# Patient Record
Sex: Male | Born: 1960
Health system: Southern US, Community
[De-identification: ages and names within clinical notes are randomized; demographics above are authoritative.]

## PROBLEM LIST (undated history)

## (undated) DIAGNOSIS — M1990 Unspecified osteoarthritis, unspecified site: Secondary | ICD-10-CM

## (undated) DIAGNOSIS — I739 Peripheral vascular disease, unspecified: Secondary | ICD-10-CM

## (undated) DIAGNOSIS — M719 Bursopathy, unspecified: Secondary | ICD-10-CM

## (undated) DIAGNOSIS — I10 Essential (primary) hypertension: Secondary | ICD-10-CM

## (undated) HISTORY — DX: Essential (primary) hypertension: I10

## (undated) HISTORY — DX: Peripheral vascular disease, unspecified: I73.9

## (undated) HISTORY — PX: FRACTURE SURGERY: SHX138

## (undated) HISTORY — DX: Bursopathy, unspecified: M71.9

---

## 2009-11-09 ENCOUNTER — Emergency Department (HOSPITAL_COMMUNITY): Admission: EM | Admit: 2009-11-09 | Discharge: 2009-11-09 | Payer: Self-pay | Admitting: Emergency Medicine

## 2012-06-29 ENCOUNTER — Encounter (HOSPITAL_COMMUNITY): Payer: Self-pay

## 2012-06-29 ENCOUNTER — Emergency Department (HOSPITAL_COMMUNITY)
Admission: EM | Admit: 2012-06-29 | Discharge: 2012-06-29 | Disposition: A | Discharge status: Home / Self Care | Payer: Self-pay | Attending: Emergency Medicine | Admitting: Emergency Medicine

## 2012-06-29 DIAGNOSIS — I10 Essential (primary) hypertension: Secondary | ICD-10-CM | POA: Insufficient documentation

## 2012-06-29 DIAGNOSIS — M069 Rheumatoid arthritis, unspecified: Secondary | ICD-10-CM | POA: Insufficient documentation

## 2012-06-29 DIAGNOSIS — F17200 Nicotine dependence, unspecified, uncomplicated: Secondary | ICD-10-CM | POA: Insufficient documentation

## 2012-06-29 DIAGNOSIS — E119 Type 2 diabetes mellitus without complications: Secondary | ICD-10-CM | POA: Insufficient documentation

## 2012-06-29 DIAGNOSIS — IMO0001 Reserved for inherently not codable concepts without codable children: Secondary | ICD-10-CM | POA: Insufficient documentation

## 2012-06-29 DIAGNOSIS — M1990 Unspecified osteoarthritis, unspecified site: Secondary | ICD-10-CM

## 2012-06-29 DIAGNOSIS — R52 Pain, unspecified: Secondary | ICD-10-CM | POA: Insufficient documentation

## 2012-06-29 MED ORDER — TRAMADOL HCL 50 MG PO TABS: 50.0000 mg | ORAL_TABLET | Freq: Four times a day (QID) | ORAL | Status: AC | PRN

## 2012-06-29 NOTE — ED Notes (Signed)
Patient reports that he is having muscle aches/pain of the right hip, knee, shoulder, and elbow especially at night.

## 2012-06-29 NOTE — ED Provider Notes (Signed)
History     CSN: [id number]  Arrival date & time 06/29/12  1020   First MD Initiated Contact with Patient 06/29/12 1104      Chief Complaint  Patient presents with  . Muscle Pain    (Consider location/radiation/quality/duration/timing/severity/associated sxs/prior treatment) HPI  51 year old male presents complaining of muscle aches. Worse for the past 10 years he has had aches and pains to his shoulders, elbows, knees, hips, and hands, right side greater than left. Aches usually worsen at night time or when it rains.  For the past 10 days he has notices increasing aches and pain throughout his body.  Pain worsening at night time, sometimes keeping him from having a restful sleep.  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  Denies any recent trauma or accident.  Denies fever, headache, rash, numbness or weakness.  Denies cp, sob, or abd pain.  Pt sts he occasionally takes OTC ibuprofen and tylenol and it has helped.  Pt denies taking any other medication.  Denies hx of stroke.  Is a half/pack day smoker.  Pt was encouraged to come to ER for further evaluation by wife  since wife is a cancer patient and is here in the hospital today for her evaluation.    History reviewed. No pertinent past medical history.  History reviewed. No pertinent past surgical history.  Family History  Problem Relation Age of Onset  . Hypertension Father   . Rheum arthritis Sister   . Diabetes Brother     History  Substance Use Topics  . Smoking status: Current Everyday Smoker -- 0.5 packs/day  . Smokeless tobacco: Never Used  . Alcohol Use: 0.6 oz/week    1 Cans of beer per week     daily      Review of Systems  Constitutional: Negative for fever, activity change, fatigue and unexpected weight change.  Gastrointestinal: Negative for abdominal pain.  Musculoskeletal: Negative for back pain.  Skin: Negative for rash.  Neurological: Negative for tremors and numbness.  All other systems reviewed  and are negative.    Allergies  Review of patient's allergies indicates no known allergies.  Home Medications   Current Outpatient Rx  Name Route Sig Dispense Refill  . ASPIRIN 81 MG PO CHEW Oral Chew 81 mg by mouth daily.      BP 176/79  Pulse 62  Temp 97.9 F (36.6 C) (Oral)  Resp 18  SpO2 99%  Physical Exam  Nursing note and vitals reviewed. Constitutional: He appears well-developed and well-nourished. No distress.       Awake, alert, nontoxic appearance  HENT:  Head: Atraumatic.  Eyes: Conjunctivae are normal. Right eye exhibits no discharge. Left eye exhibits no discharge.  Neck: Normal range of motion. Neck supple.  Cardiovascular: Normal rate and regular rhythm.   Pulmonary/Chest: Effort normal. No respiratory distress. He exhibits no tenderness.  Abdominal: Soft. There is no tenderness. There is no rebound.  Musculoskeletal: Normal range of motion. He exhibits no edema and no tenderness.       Right shoulder: Normal.       Left shoulder: Normal.       Right elbow: Normal.      Left elbow: Normal.       Right wrist: Normal.       Left wrist: Normal.       Right hip: Normal.       Left hip: Normal.       Right knee: Normal.  Left knee: Normal.       ROM appears intact, no obvious focal weakness  Neurological: He is alert.  Skin: Skin is warm and dry. No rash noted.  Psychiatric: He has a normal mood and affect.    ED Course  Procedures (including critical care time)  Labs Reviewed - No data to display No results found.   No diagnosis found.  1. Body aches/arthritis  MDM  Pt with generalized aches and pain suggestive of arthritis.  Doubt [facility] as sxs has been ongoing for years.  Examination unremarkable, VSS.  reasurrance given.  Referral given, will give short course of Tramadol to use as needed.  Pt voice understanding and agrees with plan.  Doubt rhabdomyolisis        Fayrene Helper, PA-C 06/29/12 1122

## 2012-06-29 NOTE — ED Provider Notes (Signed)
Medical screening examination/treatment/procedure(s) were performed by non-physician practitioner and as supervising physician I was immediately available for consultation/collaboration.  Donnetta Hutching, MD 06/29/12 1340

## 2013-03-20 ENCOUNTER — Emergency Department (HOSPITAL_COMMUNITY)
Admission: EM | Admit: 2013-03-20 | Discharge: 2013-03-20 | Disposition: A | Discharge status: Home / Self Care | Payer: PRIVATE HEALTH INSURANCE | Attending: Emergency Medicine | Admitting: Emergency Medicine

## 2013-03-20 ENCOUNTER — Encounter (HOSPITAL_COMMUNITY): Payer: Self-pay | Admitting: Family Medicine

## 2013-03-20 DIAGNOSIS — S0501XA Injury of conjunctiva and corneal abrasion without foreign body, right eye, initial encounter: Secondary | ICD-10-CM

## 2013-03-20 DIAGNOSIS — S058X9A Other injuries of unspecified eye and orbit, initial encounter: Secondary | ICD-10-CM | POA: Insufficient documentation

## 2013-03-20 DIAGNOSIS — F17200 Nicotine dependence, unspecified, uncomplicated: Secondary | ICD-10-CM | POA: Insufficient documentation

## 2013-03-20 DIAGNOSIS — IMO0002 Reserved for concepts with insufficient information to code with codable children: Secondary | ICD-10-CM | POA: Insufficient documentation

## 2013-03-20 DIAGNOSIS — Y929 Unspecified place or not applicable: Secondary | ICD-10-CM | POA: Insufficient documentation

## 2013-03-20 DIAGNOSIS — Y939 Activity, unspecified: Secondary | ICD-10-CM | POA: Insufficient documentation

## 2013-03-20 DIAGNOSIS — H538 Other visual disturbances: Secondary | ICD-10-CM | POA: Insufficient documentation

## 2013-03-20 MED ORDER — FLUORESCEIN SODIUM 1 MG OP STRP
ORAL_STRIP | OPHTHALMIC | Status: AC
  Filled 2013-03-20: qty 2

## 2013-03-20 MED ORDER — ERYTHROMYCIN 5 MG/GM OP OINT: TOPICAL_OINTMENT | OPHTHALMIC | Status: DC

## 2013-03-20 MED ORDER — OXYCODONE-ACETAMINOPHEN 5-325 MG PO TABS: 1.0000 | ORAL_TABLET | Freq: Four times a day (QID) | ORAL | Status: DC | PRN

## 2013-03-20 MED ORDER — TETRACAINE HCL 0.5 % OP SOLN
1.0000 [drp] | Freq: Once | OPHTHALMIC | Status: AC
  Administered 2013-03-20: 1 [drp] via OPHTHALMIC
  Filled 2013-03-20: qty 2

## 2013-03-20 MED ORDER — FLUORESCEIN SODIUM 1 MG OP STRP
2.0000 | ORAL_STRIP | Freq: Once | OPHTHALMIC | Status: AC
  Administered 2013-03-20: 19:00:00 via OPHTHALMIC
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
## 2013-03-22 NOTE — ED Provider Notes (Signed)
Medical screening examination/treatment/procedure(s) were performed by non-physician practitioner and as supervising physician I was immediately available for consultation/collaboration.   Beuford Garcilazo W. Yaden Seith, MD 03/22/13 1313 

## 2013-03-24 ENCOUNTER — Emergency Department (INDEPENDENT_AMBULATORY_CARE_PROVIDER_SITE_OTHER)
Admission: EM | Admit: 2013-03-24 | Discharge: 2013-03-24 | Disposition: A | Discharge status: Home / Self Care | Payer: PRIVATE HEALTH INSURANCE | Source: Home / Self Care | Attending: Family Medicine | Admitting: Family Medicine

## 2013-03-24 ENCOUNTER — Encounter (HOSPITAL_COMMUNITY): Payer: Self-pay | Admitting: Emergency Medicine

## 2013-03-24 DIAGNOSIS — Z5189 Encounter for other specified aftercare: Secondary | ICD-10-CM

## 2013-03-24 DIAGNOSIS — S0501XD Injury of conjunctiva and corneal abrasion without foreign body, right eye, subsequent encounter: Secondary | ICD-10-CM

## 2013-03-24 HISTORY — DX: Unspecified osteoarthritis, unspecified site: M19.90

## 2013-03-24 MED ORDER — TOBRAMYCIN 0.3 % OP SOLN
OPHTHALMIC | Status: AC
  Filled 2013-03-24: qty 5

## 2013-03-24 MED ORDER — TETRACAINE HCL 0.5 % OP SOLN
OPHTHALMIC | Status: AC
  Filled 2013-03-24: qty 2
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
## 2013-03-31 NOTE — ED Provider Notes (Signed)
Medical screening examination/treatment/procedure(s) were performed by non-physician practitioner and as supervising physician I was immediately available for consultation/collaboration.   MORENO-COLL,Tenzin Edelman; MD  Getsemani Lindon Moreno-Coll, MD 03/31/13 0922 

## 2016-10-29 ENCOUNTER — Emergency Department (HOSPITAL_COMMUNITY)
Admission: EM | Admit: 2016-10-29 | Discharge: 2016-10-29 | Disposition: A | Discharge status: Home / Self Care | Payer: Self-pay | Attending: Physician Assistant | Admitting: Physician Assistant

## 2016-10-29 ENCOUNTER — Telehealth: Payer: Self-pay | Admitting: *Deleted

## 2016-10-29 ENCOUNTER — Encounter (HOSPITAL_COMMUNITY): Payer: Self-pay | Admitting: *Deleted

## 2016-10-29 DIAGNOSIS — G8929 Other chronic pain: Secondary | ICD-10-CM | POA: Insufficient documentation

## 2016-10-29 DIAGNOSIS — F17200 Nicotine dependence, unspecified, uncomplicated: Secondary | ICD-10-CM | POA: Insufficient documentation

## 2016-10-29 NOTE — Discharge Instructions (Signed)
Please read attached information. If you experience any new or worsening signs or symptoms please return to the emergency room for evaluation. Please follow-up with your primary care provider or specialist as discussed.  °

## 2016-10-29 NOTE — ED Triage Notes (Signed)
Pt reports aches and pains from shoulder down to his knees for entire life but more severe recently. Reports abd being tender on palpation. No acute distress noted at triage.

## 2016-10-29 NOTE — Telephone Encounter (Signed)
Entered in error

## 2016-10-29 NOTE — ED Provider Notes (Signed)
[facility]-EMERGENCY DEPT Provider Note   CSN: [id number] Arrival date & time: 10/29/16  [id number]     [id number]   Chief Complaint Chief Complaint  Patient presents with  . Pain    HPI Ruben Fuentes is a 55 y.o. male.  HPI   55 year old male presents today with complaints of chronic pain. Patient reports since he was a child he had diffuse body aches, pain in his joints. He reports symptoms are worse after prolonged periods of sitting or ambulating. He reports cold weather makes symptoms worse, denies any significant swelling or edema. Denies any other systemic complaints including fever, weight loss, weight gain, heat or cold intolerance. Patient has not been formally evaluated for this in the past, reports his Orange cart recently expired. Patient denies any other acute complaints and is accompanied by his significant other today.  Past Medical History:  Diagnosis Date  . Arthritis     There are no active problems to display for this patient.   History reviewed. No pertinent surgical history.     Home Medications    Prior to Admission medications   Medication Sig Start Date End Date Taking? Authorizing Provider  ibuprofen (ADVIL,MOTRIN) 200 MG tablet Take 600 mg by mouth every 6 (six) hours as needed for mild pain.   Yes Historical Provider, MD  naproxen sodium (ALEVE) 220 MG tablet Take 660 mg by mouth daily as needed (pain).   Yes Historical Provider, MD  erythromycin ophthalmic ointment Place a 1/2 inch ribbon of ointment into the lower eyelid. Patient not taking: Reported on 10/29/2016 03/20/13   Roxy Horsemanobert Browning, PA-C  oxyCODONE-acetaminophen (PERCOCET/ROXICET) 5-325 MG per tablet Take 1 tablet by mouth every 6 (six) hours as needed for pain. Patient not taking: Reported on 10/29/2016 03/20/13   Roxy Horsemanobert Browning, PA-C    Family History Family History  Problem Relation Age of Onset  . Hypertension Father   . Rheum arthritis Sister   . Diabetes Brother     Social  History Social History  Substance Use Topics  . Smoking status: Current Every Day Smoker    Packs/day: 0.50  . Smokeless tobacco: Never Used  . Alcohol use 0.6 oz/week    1 Cans of beer per week     Comment: daily     Allergies   Patient has no known allergies.   Review of Systems Review of Systems  All other systems reviewed and are negative.    Physical Exam Updated Vital Signs BP 179/89 (BP Location: Left Arm)   Pulse 88   Temp 97.6 F (36.4 C) (Oral)   Resp 22   Ht 5\' 7"  (1.702 m)   Wt 60 kg   SpO2 99%   BMI 20.71 kg/m   Physical Exam  Constitutional: He is oriented to person, place, and time. He appears well-developed and well-nourished.  HENT:  Head: Normocephalic and atraumatic.  Eyes: Conjunctivae are normal. Pupils are equal, round, and reactive to light. Right eye exhibits no discharge. Left eye exhibits no discharge. No scleral icterus.  Neck: Normal range of motion. No JVD present. No tracheal deviation present.  Pulmonary/Chest: Effort normal. No stridor.  Musculoskeletal:  Very minor tenderness to palpation of all major muscle groups, joints are supple with full active range of motion, no rashes or skin changes  Neurological: He is alert and oriented to person, place, and time. Coordination normal.  Psychiatric: He has a normal mood and affect. His behavior is normal. Judgment and thought content normal.  Nursing  note and vitals reviewed.    ED Treatments / Results  Labs (all labs ordered are listed, but only abnormal results are displayed) Labs Reviewed - No data to display  EKG  EKG Interpretation None       Radiology No results found.  Procedures Procedures (including critical care time)  Medications Ordered in ED Medications - No data to display   Initial Impression / Assessment and Plan / ED Course  I have reviewed the triage vital signs and the nursing notes.  Pertinent labs & imaging results that were available during my  care of the patient were reviewed by me and considered in my medical decision making (see chart for details).  Clinical Course     Labs:  Imaging:  Consults:  Therapeutics:  Discharge Meds:   Assessment/Plan:  Patient reports complaints of pain diffusely throughout his body since he was a child. No acute change. Patient's complaints. Arthritic in nature. He will be referred to [facility] for reevaluation and ongoing management of his chronic pain.       Final Clinical Impressions(s) / ED Diagnoses   Final diagnoses:  Other chronic pain    New Prescriptions New Prescriptions   No medications on file     Eyvonne MechanicJeffrey Asuncion Tapscott, PA-C 10/29/16 1016    Courteney Lyn Mackuen, MD 10/29/16 1536

## 2016-11-04 ENCOUNTER — Ambulatory Visit: Payer: Self-pay | Attending: Internal Medicine | Admitting: Physician Assistant

## 2016-11-04 ENCOUNTER — Encounter: Payer: Self-pay | Admitting: Physician Assistant

## 2016-11-04 VITALS — BP 189/98 | HR 80 | Temp 97.8°F | Wt 132.8 lb

## 2016-11-04 DIAGNOSIS — Z5189 Encounter for other specified aftercare: Secondary | ICD-10-CM | POA: Insufficient documentation

## 2016-11-04 DIAGNOSIS — M1990 Unspecified osteoarthritis, unspecified site: Secondary | ICD-10-CM | POA: Insufficient documentation

## 2016-11-04 DIAGNOSIS — I10 Essential (primary) hypertension: Secondary | ICD-10-CM | POA: Insufficient documentation

## 2016-11-04 DIAGNOSIS — M2550 Pain in unspecified joint: Secondary | ICD-10-CM | POA: Insufficient documentation

## 2016-11-04 DIAGNOSIS — Z23 Encounter for immunization: Secondary | ICD-10-CM

## 2016-11-04 LAB — CBC WITH DIFFERENTIAL/PLATELET
BASOS PCT: 0 %
Basophils Absolute: 0 cells/uL (ref 0–200)
EOS PCT: 0 %
Eosinophils Absolute: 0 cells/uL — ABNORMAL LOW (ref 15–500)
HCT: 48.7 % (ref 38.5–50.0)
Hemoglobin: 16.2 g/dL (ref 13.2–17.1)
LYMPHS PCT: 41 %
Lymphs Abs: 2788 cells/uL (ref 850–3900)
MCH: 29.8 pg (ref 27.0–33.0)
MCHC: 33.3 g/dL (ref 32.0–36.0)
MCV: 89.5 fL (ref 80.0–100.0)
MONO ABS: 884 {cells}/uL (ref 200–950)
MONOS PCT: 13 %
MPV: 9.5 fL (ref 7.5–12.5)
NEUTROS PCT: 46 %
Neutro Abs: 3128 cells/uL (ref 1500–7800)
PLATELETS: 287 10*3/uL (ref 140–400)
RBC: 5.44 MIL/uL (ref 4.20–5.80)
RDW: 13.6 % (ref 11.0–15.0)
WBC: 6.8 10*3/uL (ref 3.8–10.8)

## 2016-11-04 LAB — HEPATIC FUNCTION PANEL
ALBUMIN: 4.7 g/dL (ref 3.6–5.1)
ALK PHOS: 91 U/L (ref 40–115)
ALT: 17 U/L (ref 9–46)
AST: 24 U/L (ref 10–35)
BILIRUBIN DIRECT: 0.1 mg/dL (ref <=0.2)
BILIRUBIN TOTAL: 0.4 mg/dL (ref 0.2–1.2)
Indirect Bilirubin: 0.3 mg/dL (ref 0.2–1.2)
Total Protein: 8.1 g/dL (ref 6.1–8.1)

## 2016-11-04 LAB — BASIC METABOLIC PANEL
BUN: 7 mg/dL (ref 7–25)
CHLORIDE: 98 mmol/L (ref 98–110)
CO2: 29 mmol/L (ref 20–31)
Calcium: 9.6 mg/dL (ref 8.6–10.3)
Creat: 1.1 mg/dL (ref 0.70–1.33)
Glucose, Bld: 77 mg/dL (ref 65–99)
POTASSIUM: 3.9 mmol/L (ref 3.5–5.3)
SODIUM: 138 mmol/L (ref 135–146)

## 2016-11-04 LAB — TSH: TSH: 0.62 m[IU]/L (ref 0.40–4.50)

## 2016-11-04 MED ORDER — TRAMADOL HCL 50 MG PO TABS: 50.0000 mg | ORAL_TABLET | Freq: Three times a day (TID) | ORAL | 0 refills | Status: DC | PRN

## 2016-11-04 MED ORDER — LISINOPRIL 10 MG PO TABS: 10.0000 mg | ORAL_TABLET | Freq: Every day | ORAL | 1 refills | Status: DC

## 2016-11-04 MED FILL — traMADol HCL 50 MG TABS: 50 | 10 days supply | Qty: 30 | Fill #0

## 2016-11-04 MED FILL — ?LISINOPRIL 10 MG TABLET: 10 | 30 days supply | Qty: 30 | Fill #0

## 2016-11-04 NOTE — Progress Notes (Signed)
Ruben Fuentes [place]  ZOX:[id number]  [id number]  DOB - 02/21/1961  Chief Complaint  Patient presents with  . Hospitalization Follow-up       Subjective:   Ruben Fuentes [place] is a 55 y.o. male here today for re establish care. He was in the ED on 10/29/16 with acute on chronic joint aches. Stated 2 weeks of joint pain. Diffuse. Worse with sitting long periods and at night. OTC meds with temp relief. No labs or imaging in ED. No meds. Told to come here.   BP in ED noted 179/89. Fam hx HTN and has been told himself that his BP was high in past.   ROS: GEN: denies fever or chills, denies change in weight Skin: denies lesions or rashes HEENT: denies headache, earache, epistaxis, sore throat, or neck pain LUNGS: denies SHOB, dyspnea, PND, orthopnea CV: denies CP or palpitations ABD: denies abd pain, N or V EXT: denies muscle spasms or swelling; no pain in lower ext, no weakness NEURO: denies numbness or tingling, denies sz, stroke or TIA  ALLERGIES: No Known Allergies  PAST MEDICAL HISTORY: Past Medical History:  Diagnosis Date  . Arthritis     PAST SURGICAL HISTORY: No past surgical history on file.  MEDICATIONS AT HOME: Prior to Admission medications   Medication Sig Start Date End Date Taking? Authorizing Provider  ibuprofen (ADVIL,MOTRIN) 200 MG tablet Take 600 mg by mouth every 6 (six) hours as needed for mild pain.   Yes Historical Provider, MD  naproxen sodium (ALEVE) 220 MG tablet Take 660 mg by mouth daily as needed (pain).   Yes Historical Provider, MD  erythromycin ophthalmic ointment Place a 1/2 inch ribbon of ointment into the lower eyelid. Patient not taking: Reported on 11/04/2016 03/20/13   Roxy Horsemanobert Browning, PA-C  lisinopril (PRINIVIL,ZESTRIL) 10 MG tablet Take 1 tablet (10 mg total) by mouth daily. 11/04/16   Vivianne Masteriffany S Noel, PA-C  oxyCODONE-acetaminophen (PERCOCET/ROXICET) 5-325 MG per tablet Take 1 tablet by mouth every 6 (six) hours as needed for pain. Patient  not taking: Reported on 11/04/2016 03/20/13   Roxy Horsemanobert Browning, PA-C     Objective:   Vitals:   11/04/16 1211  BP: (!) 189/98  Pulse: 80  Temp: 97.8 F (36.6 C)  TempSrc: Oral  SpO2: 97%  Weight: 132 lb 12.8 oz (60.2 kg)    Exam General appearance : Awake, alert, not in any distress. Speech Clear. Not toxic looking HEENT: Atraumatic and Normocephalic, pupils equally reactive to light and accomodation Neck: supple, no JVD. No cervical lymphadenopathy.  Chest:Good air entry bilaterally, no added sounds  CVS: S1 S2 regular, no murmurs.  Abdomen: Bowel sounds present, Non tender and not distended with no guarding, rigidity or rebound. Extremities: B/L Lower Ext shows no edema, both legs are warm to touch Neurology: Awake alert, and oriented X 3, CN II-XII intact, Non focal   Assessment & Plan  1. Arthralgias   -check routine labs r/o systemic cause  -Tramadol prn  -considered steroids 2. Smoker  -cessation discussed  3. HTN  -start ACE  -nurse check in 1 week  -DASH diet/increase exercise  Appt with financial counselor Return in about 1 year (around 11/04/2017).  The patient was given clear instructions to go to ER or return to medical center if symptoms don't improve, worsen or new problems develop. The patient verbalized understanding. The patient was told to call to get lab results if they haven't heard anything in the next week.   This note has been created  with Education officer, environmentalDragon speech recognition software and smart phrase technology. Any transcriptional errors are unintentional.    Scot Juniffany Noel, PA-C [facility] [place], [place] [phone]   11/04/2016, 12:30 PM

## 2016-11-04 NOTE — Patient Instructions (Signed)
Low salt diet Increase exercise Stop smoking

## 2016-11-05 LAB — ANA, IFA COMPREHENSIVE PANEL
ANA: POSITIVE — AB
ENA SM Ab Ser-aCnc: <1
SM/RNP: NEGATIVE
SSA (RO) (ENA) ANTIBODY, IGG: NEGATIVE
SSB (LA) (ENA) ANTIBODY, IGG: NEGATIVE
Scleroderma (Scl-70) (ENA) Antibody, IgG: <1
ds DNA Ab: <1 IU/mL

## 2016-11-05 LAB — ANTI-NUCLEAR AB-TITER (ANA TITER)

## 2016-11-05 LAB — C-REACTIVE PROTEIN: CRP: 4.9 mg/L (ref <8.0)

## 2016-11-05 LAB — HIV ANTIBODY (ROUTINE TESTING W REFLEX): HIV 1&2 Ab, 4th Generation: NONREACTIVE

## 2016-11-05 LAB — SEDIMENTATION RATE: Sed Rate: 1 mm/hr (ref 0–20)

## 2016-11-06 LAB — ANTIPHOSPHOLIPID SYNDROME DIAGNOSTIC PANEL
Anticardiolipin IgA: <11 [APL'U]
Anticardiolipin IgG: <14 [GPL'U]
Anticardiolipin IgM: <12 [MPL'U]
BETA 2 GLYCO I IGG: 9 SGU (ref <=20)
Beta-2-Glycoprotein I IgA: <9 SAU (ref <=20)
Beta-2-Glycoprotein I IgM: <9 SMU (ref <=20)

## 2016-11-06 LAB — RFX PTT-LA W/RFX TO HEX PHASE CONF: PTT-LA SCREEN: 40 s (ref <=40)

## 2016-11-06 LAB — RFX DRVVT SCR W/RFLX CONF 1:1 MIX: DRVVT SCREEN: 34 s (ref <=45)

## 2016-11-07 ENCOUNTER — Telehealth: Payer: Self-pay

## 2016-11-07 NOTE — Telephone Encounter (Signed)
Pt was called and no VM set up to leave message. If pt call back please inform him that his results are normal and to keep his follow up appointment.

## 2016-11-13 ENCOUNTER — Ambulatory Visit: Payer: Self-pay | Attending: Family Medicine | Admitting: Family Medicine

## 2016-11-13 ENCOUNTER — Encounter: Payer: Self-pay | Admitting: Family Medicine

## 2016-11-13 VITALS — BP 190/80 | HR 76 | Temp 98.0°F | Resp 16 | Ht 66.0 in | Wt 131.0 lb

## 2016-11-13 DIAGNOSIS — I10 Essential (primary) hypertension: Secondary | ICD-10-CM | POA: Insufficient documentation

## 2016-11-13 DIAGNOSIS — M25461 Effusion, right knee: Secondary | ICD-10-CM | POA: Insufficient documentation

## 2016-11-13 DIAGNOSIS — M25462 Effusion, left knee: Secondary | ICD-10-CM | POA: Insufficient documentation

## 2016-11-13 DIAGNOSIS — M2550 Pain in unspecified joint: Secondary | ICD-10-CM | POA: Insufficient documentation

## 2016-11-13 DIAGNOSIS — M1990 Unspecified osteoarthritis, unspecified site: Secondary | ICD-10-CM | POA: Insufficient documentation

## 2016-11-13 DIAGNOSIS — Z0000 Encounter for general adult medical examination without abnormal findings: Secondary | ICD-10-CM | POA: Insufficient documentation

## 2016-11-13 DIAGNOSIS — M25561 Pain in right knee: Secondary | ICD-10-CM | POA: Insufficient documentation

## 2016-11-13 DIAGNOSIS — M25562 Pain in left knee: Secondary | ICD-10-CM | POA: Insufficient documentation

## 2016-11-13 MED ORDER — IBUPROFEN 800 MG PO TABS: 800.0000 mg | ORAL_TABLET | Freq: Three times a day (TID) | ORAL | 0 refills | Status: DC | PRN

## 2016-11-13 MED ORDER — HYDRALAZINE HCL 25 MG PO TABS: 25.0000 mg | ORAL_TABLET | Freq: Four times a day (QID) | ORAL | 2 refills | Status: DC

## 2016-11-13 MED FILL — hydrALAZINE HCL 25 MG TABS: 25 | 8 days supply | Qty: 30 | Fill #0

## 2016-11-13 MED FILL — IBUPROFEN 800 MG TABLET: 800 | 30 days supply | Qty: 90 | Fill #0

## 2016-11-13 NOTE — Progress Notes (Signed)
Subjective:  Patient ID: Ruben Fuentes, male    DOB: 01/30/1961  Age: 55 y.o. MRN: [id number]  CC: Arthritis   HPI Ruben Fuentes comes in to establish care after recent hospitalization for arthralgia on 10/29/16. He was also recently diagnosed with hypertension. He reports joint pain with occasional swelling to the joints especially the bilateral knees. He reports pain 10/10 at its worst. He reports working in [employer] for many years. He denies any CP or SOB. He denies any dizziness or headaches.  Outpatient Medications Prior to Visit  Medication Sig Dispense Refill  . erythromycin ophthalmic ointment Place a 1/2 inch ribbon of ointment into the lower eyelid. 3.5 g 0  . traMADol (ULTRAM) 50 MG tablet Take 1 tablet (50 mg total) by mouth every 8 (eight) hours as needed. 30 tablet 0  . ibuprofen (ADVIL,MOTRIN) 200 MG tablet Take 600 mg by mouth every 6 (six) hours as needed for mild pain.    Marland Kitchen. lisinopril (PRINIVIL,ZESTRIL) 10 MG tablet Take 1 tablet (10 mg total) by mouth daily. 30 tablet 1  . naproxen sodium (ALEVE) 220 MG tablet Take 660 mg by mouth daily as needed (pain).    Marland Kitchen. oxyCODONE-acetaminophen (PERCOCET/ROXICET) 5-325 MG per tablet Take 1 tablet by mouth every 6 (six) hours as needed for pain. (Patient not taking: Reported on 11/13/2016) 13 tablet 0   No facility-administered medications prior to visit.     ROS Review of Systems  Constitutional: Negative.   Respiratory: Negative.   Cardiovascular: Negative.   Gastrointestinal: Negative.   Musculoskeletal: Positive for arthralgias and joint swelling.    Objective:  BP (!) 190/80 Comment: manually  Pulse 76   Temp 98 F (36.7 C) (Oral)   Resp 16   Ht 5\' 6"  (1.676 m)   Wt 131 lb (59.4 kg)   SpO2 98%   BMI 21.14 kg/m   BP/Weight 11/13/2016 11/04/2016 10/29/2016  Systolic BP 190 189 179  Diastolic BP 80 98 89  Wt. (Lbs) 131 132.8 132.25  BMI 21.14 20.8 20.71      Physical Exam  Constitutional: He is  oriented to person, place, and time. He appears well-developed and well-nourished.  Neck: Normal range of motion. No JVD present.  Cardiovascular: Normal rate, regular rhythm, normal heart sounds and intact distal pulses.   Pulmonary/Chest: Effort normal and breath sounds normal.  Abdominal: Soft. Bowel sounds are normal. He exhibits no mass. There is no tenderness.  Musculoskeletal: Normal range of motion. He exhibits tenderness (Bilateral lateral knee. ). He exhibits no edema or deformity.  Neurological: He is alert and oriented to person, place, and time.  Skin: Skin is warm and dry. No erythema.     Assessment & Plan:   1. Arthralgia, unspecified joint - ibuprofen (ADVIL,MOTRIN) 800 MG tablet; Take 1 tablet (800 mg total) by mouth every 8 (eight) hours as needed for mild pain or moderate pain (Take with food.).  Dispense: 90 tablet; Refill: 0 - Rheumatoid factor  2. Essential hypertension - hydrALAZINE (APRESOLINE) 25 MG tablet; Take 1 tablet (25 mg total) by mouth 4 (four) times daily.  Dispense: 30 tablet; Refill: 2 -Return to the office in 1 week for BP check.  3. Healthcare maintenance - Hepatitis C Antibody - Ambulatory referral to Gastroenterology for colonoscopy screening.   Meds ordered this encounter  Medications  . ibuprofen (ADVIL,MOTRIN) 800 MG tablet    Sig: Take 1 tablet (800 mg total) by mouth every 8 (eight) hours as needed for mild  pain or moderate pain (Take with food.).    Dispense:  90 tablet    Refill:  0    Order Specific Question:   Supervising Provider    Answer:   Quentin AngstJEGEDE, OLUGBEMIGA E [id number]]  . hydrALAZINE (APRESOLINE) 25 MG tablet    Sig: Take 1 tablet (25 mg total) by mouth 4 (four) times daily.    Dispense:  30 tablet    Refill:  2    Order Specific Question:   Supervising Provider    Answer:   Quentin AngstJEGEDE, OLUGBEMIGA E [id number]]    Follow-up: Return in about 3 months (around 02/11/2017) for Follow up in 3 months for hypertension. Come back in 1  week for BP check. Lizbeth Bark.   Ruben Lovering R Aeron Lheureux FNP

## 2016-11-13 NOTE — Progress Notes (Signed)
Recent hospital visit for arthralgia.  Refill eye ointment?

## 2016-11-13 NOTE — Patient Instructions (Signed)
Arthritis Introduction Arthritis means joint pain. It can also mean joint disease. A joint is a place where bones come together. People who have arthritis may have:  Red joints.  Swollen joints.  Stiff joints.  Warm joints.  A fever.  A feeling of being sick. Follow these instructions at home: Pay attention to any changes in your symptoms. Take these actions to help with your pain and swelling. Medicines  Take over-the-counter and prescription medicines only as told by your doctor.  Do not take aspirin for pain if your doctor says that you may have gout. Activity  Rest your joint if your doctor tells you to.  Avoid activities that make the pain worse.  Exercise your joint regularly as told by your doctor. Try doing exercises like:  Swimming.  Water aerobics.  Biking.  Walking. Joint Care   If your joint is swollen, keep it raised (elevated) if told by your doctor.  If your joint feels stiff in the morning, try taking a warm shower.  If you have diabetes, do not apply heat without asking your doctor.  If told, apply heat to the joint:  Put a towel between the joint and the hot pack or heating pad.  Leave the heat on the area for 20-30 minutes.  If told, apply ice to the joint:  Put ice in a plastic bag.  Place a towel between your skin and the bag.  Leave the ice on for 20 minutes, 2-3 times per day.  Keep all follow-up visits as told by your doctor. Contact a doctor if:  The pain gets worse.  You have a fever. Get help right away if:  You have very bad pain in your joint.  You have swelling in your joint.  Your joint is red.  Many joints become painful and swollen.  You have very bad back pain.  Your leg is very weak.  You cannot control your pee (urine) or poop (stool). This information is not intended to replace advice given to you by your health care provider. Make sure you discuss any questions you have with your health care  provider. Document Released: 02/18/2010 Document Revised: 05/01/2016 Document Reviewed: 02/19/2015  2017 Elsevier Hypertension Hypertension is another name for high blood pressure. High blood pressure forces your heart to work harder to pump blood. A blood pressure reading has two numbers, which includes a higher number over a lower number (example: 110/72). Follow these instructions at home:  Have your blood pressure rechecked by your doctor.  Only take medicine as told by your doctor. Follow the directions carefully. The medicine does not work as well if you skip doses. Skipping doses also puts you at risk for problems.  Do not smoke.  Monitor your blood pressure at home as told by your doctor. Contact a doctor if:  You think you are having a reaction to the medicine you are taking.  You have repeat headaches or feel dizzy.  You have puffiness (swelling) in your ankles.  You have trouble with your vision. Get help right away if:  You get a very bad headache and are confused.  You feel weak, numb, or faint.  You get chest or belly (abdominal) pain.  You throw up (vomit).  You cannot breathe very well. This information is not intended to replace advice given to you by your health care provider. Make sure you discuss any questions you have with your health care provider. Document Released: 05/12/2008 Document Revised: 05/01/2016 Document Reviewed: 09/16/2013  Elsevier Interactive Patient Education  2017 Elsevier Inc.  Follow up in 1 week for blood pressure check.

## 2016-11-14 LAB — RHEUMATOID FACTOR

## 2016-11-14 LAB — HEPATITIS C ANTIBODY: HCV AB: NEGATIVE

## 2016-11-14 NOTE — Progress Notes (Signed)
Letter sent.

## 2016-11-17 ENCOUNTER — Telehealth: Payer: Self-pay | Admitting: *Deleted

## 2016-11-17 NOTE — Telephone Encounter (Signed)
Pt called at 3:16pm today to ask if he would reschedule appointment for a different time slot on Thursday, 11/20/16. Left message on voicemail asking patient to call back regarding appointment.

## 2016-11-25 ENCOUNTER — Ambulatory Visit: Payer: Self-pay | Attending: Internal Medicine

## 2016-11-25 ENCOUNTER — Ambulatory Visit (HOSPITAL_BASED_OUTPATIENT_CLINIC_OR_DEPARTMENT_OTHER): Payer: Self-pay | Admitting: *Deleted

## 2016-11-25 ENCOUNTER — Other Ambulatory Visit: Payer: Self-pay | Admitting: Family Medicine

## 2016-11-25 VITALS — BP 170/90 | HR 73 | Resp 20

## 2016-11-25 DIAGNOSIS — I10 Essential (primary) hypertension: Secondary | ICD-10-CM

## 2016-11-25 MED ORDER — HYDRALAZINE HCL 50 MG PO TABS: 50.0000 mg | ORAL_TABLET | Freq: Four times a day (QID) | ORAL | 2 refills | Status: DC

## 2016-11-25 MED ORDER — AMLODIPINE BESYLATE 5 MG PO TABS: 5.0000 mg | ORAL_TABLET | Freq: Every day | ORAL | 2 refills | Status: DC

## 2016-11-25 MED ORDER — AMLODIPINE BESYLATE 5 MG PO TABS: 10.0000 mg | ORAL_TABLET | Freq: Every day | ORAL | 2 refills | Status: DC

## 2016-11-25 MED FILL — AMLODIPINE BESYLATE 5 MG TA: 5 | 30 days supply | Qty: 30 | Fill #0

## 2016-11-25 MED FILL — hydrALAZINE HCL 50 MG TABS: 50 | 30 days supply | Qty: 120 | Fill #0

## 2016-11-25 NOTE — Progress Notes (Signed)
Pt arrived for BP check today. He has been taking medications Hydralzine as prescribed but took last tablet yesterday. BP elevated this a.m. 187/ 87 and 170/90. Pt denies chest pain, blurred vision, SOB. Ambulated to room 1 without assistance.  M. Hairston,FNP aware of blood pressure reading. Pt notified of changes to blood pressure medication at this time. Educated on s/s of stroke and hypotension. Pt aware to return for BP check in 1 week, sooner if needed and to bring his blood pressure monitor.

## 2016-11-25 NOTE — Patient Instructions (Signed)
DASH Eating Plan DASH stands for "Dietary Approaches to Stop Hypertension." The DASH eating plan is a healthy eating plan that has been shown to reduce high blood pressure (hypertension). Additional health benefits may include reducing the risk of type 2 diabetes mellitus, heart disease, and stroke. The DASH eating plan may also help with weight loss. What do I need to know about the DASH eating plan? For the DASH eating plan, you will follow these general guidelines:  Choose foods with less than 150 milligrams of sodium per serving (as listed on the food label).  Use salt-free seasonings or herbs instead of table salt or sea salt.  Check with your health care provider or pharmacist before using salt substitutes.  Eat lower-sodium products. These are often labeled as "low-sodium" or "no salt added."  Eat fresh foods. Avoid eating a lot of canned foods.  Eat more vegetables, fruits, and low-fat dairy products.  Choose whole grains. Look for the word "whole" as the first word in the ingredient list.  Choose fish and skinless chicken or turkey more often than red meat. Limit fish, poultry, and meat to 6 oz (170 g) each day.  Limit sweets, desserts, sugars, and sugary drinks.  Choose heart-healthy fats.  Eat more home-cooked food and less restaurant, buffet, and fast food.  Limit fried foods.  Do not fry foods. Cook foods using methods such as baking, boiling, grilling, and broiling instead.  When eating at a restaurant, ask that your food be prepared with less salt, or no salt if possible. What foods can I eat? Seek help from a dietitian for individual calorie needs. Grains  Whole grain or whole wheat bread. Brown rice. Whole grain or whole wheat pasta. Quinoa, bulgur, and whole grain cereals. Low-sodium cereals. Corn or whole wheat flour tortillas. Whole grain cornbread. Whole grain crackers. Low-sodium crackers. Vegetables  Fresh or frozen vegetables (raw, steamed, roasted, or  grilled). Low-sodium or reduced-sodium tomato and vegetable juices. Low-sodium or reduced-sodium tomato sauce and paste. Low-sodium or reduced-sodium canned vegetables. Fruits  All fresh, canned (in natural juice), or frozen fruits. Meat and Other Protein Products  Ground beef (85% or leaner), grass-fed beef, or beef trimmed of fat. Skinless chicken or turkey. Ground chicken or turkey. Pork trimmed of fat. All fish and seafood. Eggs. Dried beans, peas, or lentils. Unsalted nuts and seeds. Unsalted canned beans. Dairy  Low-fat dairy products, such as skim or 1% milk, 2% or reduced-fat cheeses, low-fat ricotta or cottage cheese, or plain low-fat yogurt. Low-sodium or reduced-sodium cheeses. Fats and Oils  Tub margarines without trans fats. Light or reduced-fat mayonnaise and salad dressings (reduced sodium). Avocado. Safflower, olive, or canola oils. Natural peanut or almond butter. Other  Unsalted popcorn and pretzels. The items listed above may not be a complete list of recommended foods or beverages. Contact your dietitian for more options.  What foods are not recommended? Grains  White bread. White pasta. White rice. Refined cornbread. Bagels and croissants. Crackers that contain trans fat. Vegetables  Creamed or fried vegetables. Vegetables in a cheese sauce. Regular canned vegetables. Regular canned tomato sauce and paste. Regular tomato and vegetable juices. Fruits  Canned fruit in light or heavy syrup. Fruit juice. Meat and Other Protein Products  Fatty cuts of meat. Ribs, chicken wings, bacon, sausage, bologna, salami, chitterlings, fatback, hot dogs, bratwurst, and packaged luncheon meats. Salted nuts and seeds. Canned beans with salt. Dairy  Whole or 2% milk, cream, half-and-half, and cream cheese. Whole-fat or sweetened yogurt. Full-fat cheeses   or blue cheese. Nondairy creamers and whipped toppings. Processed cheese, cheese spreads, or cheese curds. Condiments  Onion and garlic  salt, seasoned salt, table salt, and sea salt. Canned and packaged gravies. Worcestershire sauce. Tartar sauce. Barbecue sauce. Teriyaki sauce. Soy sauce, including reduced sodium. Steak sauce. Fish sauce. Oyster sauce. Cocktail sauce. Horseradish. Ketchup and mustard. Meat flavorings and tenderizers. Bouillon cubes. Hot sauce. Tabasco sauce. Marinades. Taco seasonings. Relishes. Fats and Oils  Butter, stick margarine, lard, shortening, ghee, and bacon fat. Coconut, palm kernel, or palm oils. Regular salad dressings. Other  Pickles and olives. Salted popcorn and pretzels. The items listed above may not be a complete list of foods and beverages to avoid. Contact your dietitian for more information.  Where can I find more information? National Heart, Lung, and Blood Institute: www.nhlbi.nih.gov/health/health-topics/topics/dash/ This information is not intended to replace advice given to you by your health care provider. Make sure you discuss any questions you have with your health care provider. Document Released: 11/13/2011 Document Revised: 05/01/2016 Document Reviewed: 09/28/2013 Elsevier Interactive Patient Education  2017 Elsevier Inc. Hypertension Hypertension is another name for high blood pressure. High blood pressure forces your heart to work harder to pump blood. A blood pressure reading has two numbers, which includes a higher number over a lower number (example: 110/72). Follow these instructions at home:  Have your blood pressure rechecked by your doctor.  Only take medicine as told by your doctor. Follow the directions carefully. The medicine does not work as well if you skip doses. Skipping doses also puts you at risk for problems.  Do not smoke.  Monitor your blood pressure at home as told by your doctor. Contact a doctor if:  You think you are having a reaction to the medicine you are taking.  You have repeat headaches or feel dizzy.  You have puffiness (swelling) in your  ankles.  You have trouble with your vision. Get help right away if:  You get a very bad headache and are confused.  You feel weak, numb, or faint.  You get chest or belly (abdominal) pain.  You throw up (vomit).  You cannot breathe very well. This information is not intended to replace advice given to you by your health care provider. Make sure you discuss any questions you have with your health care provider. Document Released: 05/12/2008 Document Revised: 05/01/2016 Document Reviewed: 09/16/2013 Elsevier Interactive Patient Education  2017 Elsevier Inc.  

## 2016-11-26 ENCOUNTER — Other Ambulatory Visit: Payer: Self-pay | Admitting: Family Medicine

## 2016-11-26 MED ORDER — HYDROCHLOROTHIAZIDE 25 MG PO TABS: 50.0000 mg | ORAL_TABLET | Freq: Every day | ORAL | 2 refills | Status: DC

## 2016-11-26 MED ORDER — AMLODIPINE BESYLATE 10 MG PO TABS: 10.0000 mg | ORAL_TABLET | Freq: Every day | ORAL | 2 refills | Status: DC

## 2016-11-26 NOTE — Progress Notes (Signed)
I have increased Ruben Fuentes's amlodipine dose to 10 mg daily. I have also discontinued his hydralazine and started him on hydrochlorothiazide 25 mg daily for better BP control. When he returns in 1 week for BP, BP goal is SBP 125-130. [facility]. Please make sure pt.is aware of the office closing early at 2 pm Friday and being closed on Monday & Friday so that he can pick up his medications.

## 2016-12-03 ENCOUNTER — Ambulatory Visit: Payer: Self-pay

## 2016-12-09 ENCOUNTER — Telehealth: Payer: Self-pay | Admitting: *Deleted

## 2016-12-15 ENCOUNTER — Encounter: Payer: Self-pay | Admitting: Family Medicine

## 2016-12-26 NOTE — Telephone Encounter (Signed)
Left message on voicemail to return call, 3rd attempt.

## 2017-01-05 ENCOUNTER — Telehealth: Payer: Self-pay | Admitting: Family Medicine

## 2017-01-05 NOTE — Telephone Encounter (Signed)
Patient's wife called the office to speak with PCP regarding a referral to get a colonoscopy done. Please call wife Dewayne Hatchnn at [phone], please advice or do you want me to put him on the scheduled first to be seen?

## 2017-01-05 NOTE — Telephone Encounter (Signed)
Place him on the schedule first to be seen.

## 2017-01-05 NOTE — Telephone Encounter (Signed)
Patient's wife called the office to speak with PCP regarding a referral to get a colonoscopy done. Please call wife Dewayne Hatchnn at [phone].  Thank you.

## 2017-01-06 NOTE — Telephone Encounter (Signed)
I called the patient's wife and I lvm  To call me back regarding her gi referral

## 2017-01-12 ENCOUNTER — Ambulatory Visit: Payer: Self-pay | Attending: Family Medicine | Admitting: *Deleted

## 2017-01-12 ENCOUNTER — Other Ambulatory Visit: Payer: Self-pay | Admitting: Family Medicine

## 2017-01-12 VITALS — BP 167/66

## 2017-01-12 DIAGNOSIS — I10 Essential (primary) hypertension: Secondary | ICD-10-CM

## 2017-01-12 DIAGNOSIS — Z79899 Other long term (current) drug therapy: Secondary | ICD-10-CM | POA: Insufficient documentation

## 2017-01-12 MED ORDER — LOSARTAN POTASSIUM 50 MG PO TABS: 50.0000 mg | ORAL_TABLET | Freq: Every day | ORAL | 2 refills | Status: DC

## 2017-01-12 MED ORDER — HYDROCHLOROTHIAZIDE 50 MG PO TABS: 50.0000 mg | ORAL_TABLET | Freq: Every day | ORAL | 2 refills | Status: DC

## 2017-01-12 MED FILL — LOSARTAN POTASSIUM 50 MG TA: 50 | 30 days supply | Qty: 30 | Fill #0

## 2017-01-12 MED FILL — HYDROCHLOROTHIAZIDE 25 MG T: 25 | 30 days supply | Qty: 60 | Fill #0

## 2017-01-12 NOTE — Progress Notes (Unsigned)
Patient here for BP check with RN. Based BP measurements losartan 50 mg was added to current medication regimen.

## 2017-01-12 NOTE — Progress Notes (Signed)
Pt here for BP check. Pt denies chest pain, SOB, HA, new vison concerns, or generalized swelling. He is accompanied by his wife. He states he has been taking medications as prescribed and taking and keeping blood pressure  log at home.  Blood pressure taken manually while patient is sitting. Pt aware of additional medication being added to daily regimen to lower  blood pressure. Aware that goal per Arrie SenateMandesia Hairston, FNP is less than 130. Instructed to return for office visit if unable to maintain systolic blood pressure at target level.  Patient verbalized understanding.  Nurse visit will be routed to provider.Guy Francoravia Squire Withey, RN, BSN

## 2017-01-12 NOTE — Patient Instructions (Signed)
Please take BP at home. Keep diary of readings. If Systolic blood pressure is greater than 130 consistantly, please return for f/u with Ruben Fuentes.

## 2017-01-21 ENCOUNTER — Encounter: Payer: Self-pay | Admitting: Family Medicine

## 2017-01-21 ENCOUNTER — Ambulatory Visit: Payer: Self-pay | Attending: Family Medicine | Admitting: Family Medicine

## 2017-01-21 VITALS — BP 135/68 | HR 79 | Temp 98.4°F | Resp 18 | Ht 66.0 in | Wt 130.6 lb

## 2017-01-21 DIAGNOSIS — Z0000 Encounter for general adult medical examination without abnormal findings: Secondary | ICD-10-CM

## 2017-01-21 DIAGNOSIS — M150 Primary generalized (osteo)arthritis: Secondary | ICD-10-CM | POA: Insufficient documentation

## 2017-01-21 DIAGNOSIS — M2550 Pain in unspecified joint: Secondary | ICD-10-CM | POA: Insufficient documentation

## 2017-01-21 DIAGNOSIS — M1990 Unspecified osteoarthritis, unspecified site: Secondary | ICD-10-CM

## 2017-01-21 DIAGNOSIS — G8929 Other chronic pain: Secondary | ICD-10-CM | POA: Insufficient documentation

## 2017-01-21 DIAGNOSIS — F17200 Nicotine dependence, unspecified, uncomplicated: Secondary | ICD-10-CM | POA: Insufficient documentation

## 2017-01-21 DIAGNOSIS — I10 Essential (primary) hypertension: Secondary | ICD-10-CM | POA: Insufficient documentation

## 2017-01-21 MED ORDER — LOSARTAN POTASSIUM 100 MG PO TABS: 100.0000 mg | ORAL_TABLET | Freq: Every day | ORAL | 2 refills | Status: DC

## 2017-01-21 MED ORDER — TRAMADOL HCL 50 MG PO TABS: 50.0000 mg | ORAL_TABLET | Freq: Four times a day (QID) | ORAL | 0 refills | Status: DC | PRN

## 2017-01-21 NOTE — Progress Notes (Signed)
Subjective:  Patient ID: Ruben Fuentes, male    DOB: 01/10/1961  Age: 56 y.o. MRN: [id number]  CC: Establish Care   HPI Ruben Fuentes presents forHypertension follow-up. He reports better controlled blood pressures. Reports blood pressures at ranging between 130s to 140s. He is still a current smoker. He reports lowering cigarettes to only 2 per day from a pack a day. He also has complains of chronic generalized joint pain. He reports pain 8 out of 10. He denies any joint swelling or weakness.    Outpatient Medications Prior to Visit  Medication Sig Dispense Refill  . amLODipine (NORVASC) 10 MG tablet Take 1 tablet (10 mg total) by mouth daily. 30 tablet 2  . erythromycin ophthalmic ointment Place a 1/2 inch ribbon of ointment into the lower eyelid. 3.5 g 0  . hydrochlorothiazide (HYDRODIURIL) 50 MG tablet Take 1 tablet (50 mg total) by mouth daily. 30 tablet 2  . ibuprofen (ADVIL,MOTRIN) 800 MG tablet Take 1 tablet (800 mg total) by mouth every 8 (eight) hours as needed for mild pain or moderate pain (Take with food.). 90 tablet 0  . losartan (COZAAR) 50 MG tablet Take 1 tablet (50 mg total) by mouth daily. 30 tablet 2  . naproxen sodium (ALEVE) 220 MG tablet Take 660 mg by mouth daily as needed (pain).    Marland Kitchen. oxyCODONE-acetaminophen (PERCOCET/ROXICET) 5-325 MG per tablet Take 1 tablet by mouth every 6 (six) hours as needed for pain. (Patient not taking: Reported on 11/13/2016) 13 tablet 0  . traMADol (ULTRAM) 50 MG tablet Take 1 tablet (50 mg total) by mouth every 8 (eight) hours as needed. (Patient not taking: Reported on 01/21/2017) 30 tablet 0   No facility-administered medications prior to visit.     ROS Review of Systems  Eyes: Negative.   Respiratory: Negative.   Cardiovascular: Negative.   Gastrointestinal: Negative.   Musculoskeletal: Positive for arthralgias.  Neurological: Negative.      Objective:  BP 135/68 (BP Location: Right Arm, Patient Position: Sitting, Cuff  Size: Normal)   Pulse 79   Temp 98.4 F (36.9 C) (Oral)   Resp 18   Ht 5\' 6"  (1.676 m)   Wt 130 lb 9.6 oz (59.2 kg)   SpO2 97%   BMI 21.08 kg/m   BP/Weight 01/21/2017 01/12/2017 11/25/2016  Systolic BP 135 167 170  Diastolic BP 68 66 90  Wt. (Lbs) 130.6 - -  BMI 21.08 - -     Physical Exam  Constitutional: He is oriented to person, place, and time.  Eyes: Conjunctivae are normal. Pupils are equal, round, and reactive to light.  Neck: No JVD present.  Cardiovascular: Normal rate, regular rhythm, normal heart sounds and intact distal pulses.   Pulmonary/Chest: Effort normal and breath sounds normal.  Abdominal: Soft. Bowel sounds are normal.  Musculoskeletal:  Generalized joint pain.  Neurological: He is alert and oriented to person, place, and time.  Skin: Skin is warm and dry.  Nursing note and vitals reviewed.  Assessment & Plan:   Problem List Items Addressed This Visit    None    Visit Diagnoses    Osteoarthritis, unspecified osteoarthritis type, unspecified site    -  Primary   Relevant Medications   traMADol (ULTRAM) 50 MG tablet   Other Relevant Orders   Ambulatory referral to Orthopedics   Essential hypertension       Relevant Medications   losartan (COZAAR) 100 MG tablet   Healthcare maintenance  Relevant Orders   Ambulatory referral to Gastroenterology      Meds ordered this encounter  Medications  . losartan (COZAAR) 100 MG tablet    Sig: Take 1 tablet (100 mg total) by mouth daily.    Dispense:  30 tablet    Refill:  2    Order Specific Question:   Supervising Provider    Answer:   Quentin Angst [id number]  . traMADol (ULTRAM) 50 MG tablet    Sig: Take 1 tablet (50 mg total) by mouth every 6 (six) hours as needed for severe pain.    Dispense:  30 tablet    Refill:  0    Order Specific Question:   Supervising Provider    Answer:   Quentin Angst [[id number]]    Follow-up: Return in about 3 months (around 04/20/2017) for  Hypertension.   Lizbeth Bark FNP

## 2017-01-21 NOTE — Progress Notes (Signed)
Patient is here for colonoscopy referral    Patient needs refill on erthromycin ointment   Patient has taking his meds today  Patient has eaten today  Patient complains knee elbow pain for a long term

## 2017-01-21 NOTE — Patient Instructions (Addendum)
Hypertension Hypertension is another name for high blood pressure. High blood pressure forces your heart to work harder to pump blood. A blood pressure reading has two numbers, which includes a higher number over a lower number (example: 110/72). Follow these instructions at home:  Have your blood pressure rechecked by your doctor.  Only take medicine as told by your doctor. Follow the directions carefully. The medicine does not work as well if you skip doses. Skipping doses also puts you at risk for problems.  Do not smoke.  Monitor your blood pressure at home as told by your doctor. Contact a doctor if:  You think you are having a reaction to the medicine you are taking.  You have repeat headaches or feel dizzy.  You have puffiness (swelling) in your ankles.  You have trouble with your vision. Get help right away if:  You get a very bad headache and are confused.  You feel weak, numb, or faint.  You get chest or belly (abdominal) pain.  You throw up (vomit).  You cannot breathe very well. This information is not intended to replace advice given to you by your health care provider. Make sure you discuss any questions you have with your health care provider. Document Released: 05/12/2008 Document Revised: 05/01/2016 Document Reviewed: 09/16/2013 Elsevier Interactive Patient Education  2017 [employer]   Colonoscopy, Adult A colonoscopy is an exam to look at the large intestine. It is done to check for problems, such as:  Lumps (tumors).  Growths (polyps).  Swelling (inflammation).  Bleeding. What happens before the procedure? Eating and drinking Follow instructions from your doctor about eating and drinking. These instructions may include:  A few days before the procedure - follow a low-fiber diet.  Avoid nuts.  Avoid seeds.  Avoid dried fruit.  Avoid raw fruits.  Avoid vegetables.  1-3 days before the procedure - follow a clear liquid diet. Avoid  liquids that have red or purple dye. Drink only clear liquids, such as:  Clear broth or bouillon.  Black coffee or tea.  Clear juice.  Clear soft drinks or sports drinks.  [place].  Popsicles.  On the day of the procedure - do not eat or drink anything during the 2 hours before the procedure. Bowel prep If you were prescribed an oral bowel prep:  Take it as told by your doctor. Starting the day before your procedure, you will need to drink a lot of liquid. The liquid will cause you to poop (have bowel movements) until your poop is almost clear or light green.  If your skin or butt gets irritated from diarrhea, you may:  Wipe the area with wipes that have medicine in them, such as adult wet wipes with aloe and vitamin E.  Put something on your skin that soothes the area, such as petroleum jelly.  If you throw up (vomit) while drinking the bowel prep, take a break for up to 60 minutes. Then begin the bowel prep again. If you keep throwing up and you cannot take the bowel prep without throwing up, call your doctor. General instructions  Ask your doctor about changing or stopping your normal medicines. This is important if you take diabetes medicines or blood thinners.  Plan to have someone take you home from the hospital or clinic. What happens during the procedure?  An IV tube may be put into one of your veins.  You will be given medicine to help you relax (sedative).  To reduce your  risk of infection:  Your doctors will wash their hands.  Your anal area will be washed with soap.  You will be asked to lie on your side with your knees bent.  Your doctor will get a long, thin, flexible tube ready. The tube will have a camera and a light on the end.  The tube will be put into your anus.  The tube will be gently put into your large intestine.  Air will be delivered into your large intestine to keep it open. You may feel some pressure or cramping.  The camera  will be used to take photos.  A small tissue sample may be removed from your body to be looked at under a microscope (biopsy). If any possible problems are found, the tissue will be sent to a lab for testing.  If small growths are found, your doctor may remove them and have them checked for cancer.  The tube that was put into your anus will be slowly removed. The procedure may vary among doctors and hospitals. What happens after the procedure?  Your doctor will check on you often until the medicines you were given have worn off.  Do not drive for 24 hours after the procedure.  You may have a small amount of blood in your poop.  You may pass gas.  You may have mild cramps or bloating in your belly (abdomen).  It is up to you to get the results of your procedure. Ask your doctor, or the department performing the procedure, when your results will be ready. This information is not intended to replace advice given to you by your health care provider. Make sure you discuss any questions you have with your health care provider. Document Released: 12/27/2010 Document Revised: 07/31/2016 Document Reviewed: 02/05/2016 Elsevier Interactive Patient Education  2017 [employer].

## 2017-01-23 MED FILL — traMADol HCL 50 MG TABS: 50 | 7 days supply | Qty: 30 | Fill #0

## 2017-02-05 ENCOUNTER — Ambulatory Visit: Payer: Self-pay | Attending: Family Medicine

## 2017-02-25 ENCOUNTER — Encounter: Payer: Self-pay | Admitting: Gastroenterology

## 2017-02-25 ENCOUNTER — Other Ambulatory Visit: Payer: Self-pay | Admitting: Family Medicine

## 2017-02-25 MED FILL — LOSARTAN POTASSIUM 100 MG T: 100 | 30 days supply | Qty: 30 | Fill #0

## 2017-02-25 NOTE — Telephone Encounter (Signed)
Prescription for Tramadol refilled. Please notify patient that I do not treat chronic pain. No more refills will be given without an office visit for follow up with referral if needed and signed CSA form.

## 2017-02-26 NOTE — Telephone Encounter (Signed)
CMA call to inform patient about prescription is ready to pick up at front desk   Patient did not answer but CMA left a VM stating the information & if have any questions just to call back

## 2017-02-27 MED FILL — traMADol HCL 50 MG TABS: 50 | 7 days supply | Qty: 30 | Fill #0

## 2017-04-08 MED FILL — LOSARTAN POTASSIUM 100 MG T: 100 | 30 days supply | Qty: 30 | Fill #1

## 2017-04-24 ENCOUNTER — Encounter: Payer: Self-pay | Admitting: Gastroenterology

## 2017-05-08 ENCOUNTER — Ambulatory Visit (AMBULATORY_SURGERY_CENTER): Payer: Self-pay

## 2017-05-08 VITALS — Ht 67.0 in | Wt 127.0 lb

## 2017-05-08 DIAGNOSIS — Z1211 Encounter for screening for malignant neoplasm of colon: Secondary | ICD-10-CM

## 2017-05-08 NOTE — Progress Notes (Signed)
No allergies to eggs or soy  No suprep samples avail Will call back next wed  No past problems with anesthesia  Declined emmi

## 2017-05-22 ENCOUNTER — Ambulatory Visit (AMBULATORY_SURGERY_CENTER): Payer: Self-pay | Admitting: Gastroenterology

## 2017-05-22 ENCOUNTER — Encounter: Payer: Self-pay | Admitting: Gastroenterology

## 2017-05-22 VITALS — BP 179/81 | HR 50 | Temp 98.7°F | Resp 11 | Ht 67.0 in | Wt 127.0 lb

## 2017-05-22 DIAGNOSIS — Z1212 Encounter for screening for malignant neoplasm of rectum: Secondary | ICD-10-CM

## 2017-05-22 DIAGNOSIS — Z1211 Encounter for screening for malignant neoplasm of colon: Secondary | ICD-10-CM

## 2017-05-22 MED ORDER — SODIUM CHLORIDE 0.9 % IV SOLN: 500.0000 mL | INTRAVENOUS | Status: DC

## 2017-05-22 MED FILL — LOSARTAN POTASSIUM 100 MG T: 100 | 30 days supply | Qty: 30 | Fill #2

## 2017-05-22 NOTE — Progress Notes (Signed)
A and O x3. Report to RN. Tolerated MAC anesthesia well.

## 2017-05-22 NOTE — Progress Notes (Signed)
Pt's states no medical or surgical changes since previsit or office visit. 

## 2017-05-22 NOTE — Patient Instructions (Signed)
YOU HAD AN ENDOSCOPIC PROCEDURE TODAY AT THE [facility]:   Refer to the procedure report that was given to you for any specific questions about what was found during the examination.  If the procedure report does not answer your questions, please call your gastroenterologist to clarify.  If you requested that your care partner not be given the details of your procedure findings, then the procedure report has been included in a sealed envelope for you to review at your convenience later.  YOU SHOULD EXPECT: Some feelings of bloating in the abdomen. Passage of more gas than usual.  Walking can help get rid of the air that was put into your GI tract during the procedure and reduce the bloating. If you had a lower endoscopy (such as a colonoscopy or flexible sigmoidoscopy) you may notice spotting of blood in your stool or on the toilet paper. If you underwent a bowel prep for your procedure, you may not have a normal bowel movement for a few days.  Please Note:  You might notice some irritation and congestion in your nose or some drainage.  This is from the oxygen used during your procedure.  There is no need for concern and it should clear up in a day or so.  SYMPTOMS TO REPORT IMMEDIATELY:   Following lower endoscopy (colonoscopy or flexible sigmoidoscopy):  Excessive amounts of blood in the stool  Significant tenderness or worsening of abdominal pains  Swelling of the abdomen that is new, acute  Fever of 100F or higher       For urgent or emergent issues, a gastroenterologist can be reached at any hour by calling [phone].   DIET:  We do recommend a small meal at first, but then you may proceed to your regular diet.  Drink plenty of fluids but you should avoid alcoholic beverages for 24 hours.  ACTIVITY:  You should plan to take it easy for the rest of today and you should NOT DRIVE or use heavy machinery until tomorrow (because of the sedation medicines used during the  test).    FOLLOW UP: Our staff will call the number listed on your records the next business day following your procedure to check on you and address any questions or concerns that you may have regarding the information given to you following your procedure. If we do not reach you, we will leave a message.  However, if you are feeling well and you are not experiencing any problems, there is no need to return our call.  We will assume that you have returned to your regular daily activities without incident.  If any biopsies were taken you will be contacted by phone or by letter within the next 1-3 weeks.  Please call us at [phone] if you have not heard about the biopsies in 3 weeks.    SIGNATURES/CONFIDENTIALITY: You and/or your care partner have signed paperwork which will be entered into your electronic medical record.  These signatures attest to the fact that that the information above on your After Visit Summary has been reviewed and is understood.  Full responsibility of the confidentiality of this discharge information lies with you and/or your care-partner.   INFORMATION ON HEMORRHOIDS GIVEN TO YOU TODAY   

## 2017-05-22 NOTE — Op Note (Signed)
[facility] Patient Name: Ruben AmosVance Collar Procedure Date: 05/22/2017 10:50 AM MRN: [id number] Endoscopist: Napoleon FormKavitha V. Nandigam , MD Age: 56 Referring MD:  Date of Birth: 07/31/1961 Gender: Male Account #: [id number] Procedure:                Colonoscopy Indications:              Screening for colorectal malignant neoplasm, This                            is the patient's first colonoscopy Medicines:                Monitored Anesthesia Care Procedure:                Pre-Anesthesia Assessment:                           - Prior to the procedure, a History and Physical                            was performed, and patient medications and                            allergies were reviewed. The patient's tolerance of                            previous anesthesia was also reviewed. The risks                            and benefits of the procedure and the sedation                            options and risks were discussed with the patient.                            All questions were answered, and informed consent                            was obtained. Prior Anticoagulants: The patient has                            taken no previous anticoagulant or antiplatelet                            agents. ASA Grade Assessment: II - A patient with                            mild systemic disease. After reviewing the risks                            and benefits, the patient was deemed in                            satisfactory condition to undergo the procedure.  After obtaining informed consent, the colonoscope                            was passed under direct vision. Throughout the                            procedure, the patient's blood pressure, pulse, and                            oxygen saturations were monitored continuously. The                            Colonoscope was introduced through the anus and                            advanced to the the  cecum, identified by                            appendiceal orifice and ileocecal valve. The                            colonoscopy was performed without difficulty. The                            patient tolerated the procedure well. The quality                            of the bowel preparation was good. The ileocecal                            valve, appendiceal orifice, and rectum were                            photographed. Scope In: 10:57:20 AM Scope Out: 11:19:44 AM Scope Withdrawal Time: 0 hours 18 minutes 15 seconds  Total Procedure Duration: 0 hours 22 minutes 24 seconds  Findings:                 The perianal and digital rectal examinations were                            normal.                           Non-bleeding internal hemorrhoids were found during                            retroflexion. The hemorrhoids were small.                           The exam was otherwise without abnormality. Complications:            No immediate complications. Estimated Blood Loss:     Estimated blood loss: none. Impression:               - Non-bleeding internal hemorrhoids.                           -  The examination was otherwise normal.                           - No specimens collected. Recommendation:           - Patient has a contact number available for                            emergencies. The signs and symptoms of potential                            delayed complications were discussed with the                            patient. Return to normal activities tomorrow.                            Written discharge instructions were provided to the                            patient.                           - Resume previous diet.                           - Continue present medications.                           - Repeat colonoscopy in 10 years for screening                            purposes. Napoleon Form, MD 05/22/2017 11:24:44 AM This report has been signed  electronically.

## 2017-05-25 ENCOUNTER — Telehealth: Payer: Self-pay

## 2017-05-25 NOTE — Telephone Encounter (Signed)
Number identifier, left message. 

## 2017-05-25 NOTE — Telephone Encounter (Signed)
Called [phone] and left a messaged we tried to reach pt for a follow up call. maw

## 2017-05-29 ENCOUNTER — Ambulatory Visit: Payer: Self-pay | Attending: Family Medicine

## 2017-06-29 ENCOUNTER — Other Ambulatory Visit: Payer: Self-pay | Admitting: Family Medicine

## 2017-06-29 DIAGNOSIS — I10 Essential (primary) hypertension: Secondary | ICD-10-CM

## 2017-06-29 MED FILL — LOSARTAN POTASSIUM 100 MG T: 100 | 30 days supply | Qty: 30 | Fill #0

## 2017-07-08 ENCOUNTER — Encounter: Payer: Self-pay | Admitting: Family Medicine

## 2017-07-08 ENCOUNTER — Ambulatory Visit: Payer: Self-pay | Attending: Family Medicine | Admitting: Family Medicine

## 2017-07-08 VITALS — BP 170/76 | HR 54 | Temp 98.0°F | Resp 18 | Ht 67.0 in | Wt 133.6 lb

## 2017-07-08 DIAGNOSIS — M159 Polyosteoarthritis, unspecified: Secondary | ICD-10-CM

## 2017-07-08 DIAGNOSIS — M1991 Primary osteoarthritis, unspecified site: Secondary | ICD-10-CM | POA: Insufficient documentation

## 2017-07-08 DIAGNOSIS — M150 Primary generalized (osteo)arthritis: Secondary | ICD-10-CM

## 2017-07-08 DIAGNOSIS — Z13220 Encounter for screening for lipoid disorders: Secondary | ICD-10-CM

## 2017-07-08 DIAGNOSIS — Z7722 Contact with and (suspected) exposure to environmental tobacco smoke (acute) (chronic): Secondary | ICD-10-CM | POA: Insufficient documentation

## 2017-07-08 DIAGNOSIS — G8929 Other chronic pain: Secondary | ICD-10-CM

## 2017-07-08 DIAGNOSIS — I10 Essential (primary) hypertension: Secondary | ICD-10-CM

## 2017-07-08 DIAGNOSIS — M25511 Pain in right shoulder: Secondary | ICD-10-CM | POA: Insufficient documentation

## 2017-07-08 MED ORDER — AMLODIPINE BESYLATE 10 MG PO TABS
10.0000 mg | ORAL_TABLET | Freq: Every day | ORAL | 3 refills | Status: DC
Start: 2017-07-08 — End: 2018-01-11

## 2017-07-08 MED ORDER — TRAMADOL HCL 50 MG PO TABS: 50.0000 mg | ORAL_TABLET | Freq: Three times a day (TID) | ORAL | 0 refills | Status: DC | PRN

## 2017-07-08 MED ORDER — HYDROCHLOROTHIAZIDE 25 MG PO TABS: 25.0000 mg | ORAL_TABLET | Freq: Every day | ORAL | 3 refills | Status: DC

## 2017-07-08 MED ORDER — LOSARTAN POTASSIUM 100 MG PO TABS: 100.0000 mg | ORAL_TABLET | Freq: Every day | ORAL | 3 refills | Status: DC

## 2017-07-08 MED ORDER — NAPROXEN 500 MG PO TABS: ORAL_TABLET | ORAL | 0 refills | Status: DC

## 2017-07-08 MED ORDER — CARVEDILOL 3.125 MG PO TABS: 3.1250 mg | ORAL_TABLET | Freq: Two times a day (BID) | ORAL | 2 refills | Status: DC

## 2017-07-08 MED FILL — NAPROXEN 500 MG TABLET: 500 | 30 days supply | Qty: 60 | Fill #0

## 2017-07-08 MED FILL — CARVEDILOL 3.125 MG TABLET: 3.125 | 30 days supply | Qty: 60 | Fill #0

## 2017-07-08 MED FILL — HYDROCHLOROTHIAZIDE 25 MG T: 25 | 30 days supply | Qty: 30 | Fill #0

## 2017-07-08 MED FILL — AMLODIPINE BESYLATE 10 MG T: 10 | 30 days supply | Qty: 30 | Fill #0

## 2017-07-08 NOTE — Patient Instructions (Addendum)
Schedule walk in lab appointment for labs.  Hypertension Hypertension is another name for high blood pressure. High blood pressure forces your heart to work harder to pump blood. This can cause problems over time. There are two numbers in a blood pressure reading. There is a top number (systolic) over a bottom number (diastolic). It is best to have a blood pressure below 120/80. Healthy choices can help lower your blood pressure. You may need medicine to help lower your blood pressure if:  Your blood pressure cannot be lowered with healthy choices.  Your blood pressure is higher than 130/80.  Follow these instructions at home: Eating and drinking  If directed, follow the DASH eating plan. This diet includes: ? Filling half of your plate at each meal with fruits and vegetables. ? Filling one quarter of your plate at each meal with whole grains. Whole grains include whole wheat pasta, brown rice, and whole grain bread. ? Eating or drinking low-fat dairy products, such as skim milk or low-fat yogurt. ? Filling one quarter of your plate at each meal with low-fat (lean) proteins. Low-fat proteins include fish, skinless chicken, eggs, beans, and tofu. ? Avoiding fatty meat, cured and processed meat, or chicken with skin. ? Avoiding premade or processed food.  Eat less than 1,500 mg of salt (sodium) a day.  Limit alcohol use to no more than 1 drink a day for nonpregnant women and 2 drinks a day for men. One drink equals 12 oz of beer, 5 oz of wine, or 1 oz of hard liquor. Lifestyle  Work with your doctor to stay at a healthy weight or to lose weight. Ask your doctor what the best weight is for you.  Get at least 30 minutes of exercise that causes your heart to beat faster (aerobic exercise) most days of the week. This may include walking, swimming, or biking.  Get at least 30 minutes of exercise that strengthens your muscles (resistance exercise) at least 3 days a week. This may include  lifting weights or pilates.  Do not use any products that contain nicotine or tobacco. This includes cigarettes and e-cigarettes. If you need help quitting, ask your doctor.  Check your blood pressure at home as told by your doctor.  Keep all follow-up visits as told by your doctor. This is important. Medicines  Take over-the-counter and prescription medicines only as told by your doctor. Follow directions carefully.  Do not skip doses of blood pressure medicine. The medicine does not work as well if you skip doses. Skipping doses also puts you at risk for problems.  Ask your doctor about side effects or reactions to medicines that you should watch for. Contact a doctor if:  You think you are having a reaction to the medicine you are taking.  You have headaches that keep coming back (recurring).  You feel dizzy.  You have swelling in your ankles.  You have trouble with your vision. Get help right away if:  You get a very bad headache.  You start to feel confused.  You feel weak or numb.  You feel faint.  You get very bad pain in your: ? Chest. ? Belly (abdomen).  You throw up (vomit) more than once.  You have trouble breathing. Summary  Hypertension is another name for high blood pressure.  Making healthy choices can help lower blood pressure. If your blood pressure cannot be controlled with healthy choices, you may need to take medicine. This information is not intended to replace  advice given to you by your health care provider. Make sure you discuss any questions you have with your health care provider. Document Released: 05/12/2008 Document Revised: 10/22/2016 Document Reviewed: 10/22/2016 Elsevier Interactive Patient Education  2018 Elsevier Inc.  Arthritis Arthritis means joint pain. It can also mean joint disease. A joint is a place where bones come together. People who have arthritis may have:  Red joints.  Swollen joints.  Stiff joints.  Warm  joints.  A fever.  A feeling of being sick.  Follow these instructions at home: Pay attention to any changes in your symptoms. Take these actions to help with your pain and swelling. Medicines  Take over-the-counter and prescription medicines only as told by your doctor.  Do not take aspirin for pain if your doctor says that you may have gout. Activity  Rest your joint if your doctor tells you to.  Avoid activities that make the pain worse.  Exercise your joint regularly as told by your doctor. Try doing exercises like: ? Swimming. ? Water aerobics. ? Biking. ? Walking. Joint Care   If your joint is swollen, keep it raised (elevated) if told by your doctor.  If your joint feels stiff in the morning, try taking a warm shower.  If you have diabetes, do not apply heat without asking your doctor.  If told, apply heat to the joint: ? Put a towel between the joint and the hot pack or heating pad. ? Leave the heat on the area for 20-30 minutes.  If told, apply ice to the joint: ? Put ice in a plastic bag. ? Place a towel between your skin and the bag. ? Leave the ice on for 20 minutes, 2-3 times per day.  Keep all follow-up visits as told by your doctor. Contact a doctor if:  The pain gets worse.  You have a fever. Get help right away if:  You have very bad pain in your joint.  You have swelling in your joint.  Your joint is red.  Many joints become painful and swollen.  You have very bad back pain.  Your leg is very weak.  You cannot control your pee (urine) or poop (stool). This information is not intended to replace advice given to you by your health care provider. Make sure you discuss any questions you have with your health care provider. Document Released: 02/18/2010 Document Revised: 05/01/2016 Document Reviewed: 02/19/2015 Elsevier Interactive Patient Education  [employer]2018 [employer]

## 2017-07-08 NOTE — Progress Notes (Signed)
Subjective:  Patient ID: Ruben Fuentes, male    DOB: 02/18/1961  Age: 56 y.o. MRN: [id number]  CC: Generalized Body Aches; Extremity Weakness; and Hypertension   HPI Ruben RummageVance R Dopson presents for complains of arthralgias for which has been present for a few years, but has worsened the last three months.  Pain is located in multiple joints ( bilateral shoulders, hips, and knees) , is described as aching, and is constant .  Associated symptoms include: radiating numbness from shoulders to bilateral arms and decreased ROM of the right shoulder.  The patient has tried NSAIDs and narcotics in the past for pain, with adequate  relief.  Related to injury:no. He does reports history of fractured clavicle over 20 years ago.History of hypertension.  He is physically active is adherent to low salt diet.  He does not check BP at home. Cardiac symptoms none. Patient denies chest pain, chest pressure/discomfort, claudication, dyspnea, near-syncope, palpitations and syncope.  Cardiovascular risk factors: hypertension, male gender and smoking/ tobacco exposure. Use of agents associated with hypertension: NSAIDS. History of target organ damage: none.   Outpatient Medications Prior to Visit  Medication Sig Dispense Refill  . amLODipine (NORVASC) 10 MG tablet Take 1 tablet (10 mg total) by mouth daily. 30 tablet 2  . hydrochlorothiazide (HYDRODIURIL) 50 MG tablet Take 1 tablet (50 mg total) by mouth daily. 30 tablet 2  . ibuprofen (ADVIL,MOTRIN) 800 MG tablet Take 1 tablet (800 mg total) by mouth every 8 (eight) hours as needed for mild pain or moderate pain (Take with food.). 90 tablet 0  . losartan (COZAAR) 100 MG tablet TAKE 1 TABLET BY MOUTH DAILY. 30 tablet 0  . naproxen sodium (ALEVE) 220 MG tablet Take 660 mg by mouth daily as needed (pain).    Marland Kitchen. 0.9 %  sodium chloride infusion      No facility-administered medications prior to visit.     ROS Review of Systems  Constitutional: Negative.   Eyes:  Negative.   Respiratory: Negative.   Cardiovascular: Negative.   Gastrointestinal: Negative.   Musculoskeletal: Positive for arthralgias.  Neurological: Negative.    Objective:  BP (!) 170/76 (BP Location: Left Arm, Patient Position: Sitting, Cuff Size: Normal)   Pulse (!) 54   Temp 98 F (36.7 C) (Oral)   Resp 18   Ht 5\' 7"  (1.702 m)   Wt 133 lb 9.6 oz (60.6 kg)   SpO2 99%   BMI 20.92 kg/m   BP/Weight 07/08/2017 05/22/2017 05/08/2017  Systolic BP 170 179 -  Diastolic BP 76 81 -  Wt. (Lbs) 133.6 127 127  BMI 20.92 19.89 19.89   Physical Exam  Constitutional: He appears well-developed and well-nourished.  Eyes: Pupils are equal, round, and reactive to light. Conjunctivae are normal.  Neck: No JVD present.  Cardiovascular: Normal rate, regular rhythm, normal heart sounds and intact distal pulses.   Pulmonary/Chest: Effort normal and breath sounds normal.  Abdominal: Soft. Bowel sounds are normal.  Musculoskeletal:       Right shoulder: He exhibits decreased range of motion and pain. He exhibits no swelling.       Right hand: Decreased strength (4/5 muscle strenght with girp) noted.  Skin: Skin is warm and dry.  Nursing note and vitals reviewed.  Assessment & Plan:   Problem List Items Addressed This Visit    None    Visit Diagnoses    Primary osteoarthritis involving multiple joints    -  Primary   Relevant Medications  naproxen (NAPROSYN) 500 MG tablet   traMADol (ULTRAM) 50 MG tablet   Chronic right shoulder pain       Relevant Medications   naproxen (NAPROSYN) 500 MG tablet   traMADol (ULTRAM) 50 MG tablet   Other Relevant Orders   DG Shoulder Right   Essential hypertension       Schedule BP recheck in 2 weeks with clinic RN   If BP is greater than 90/60 (MAP 65 or greater) but not less than 130/80 may increase dose of coreg to   6.25 mg BID and recheck in another 2 weeks with clinic RN.   Follow up with PCP in 3 months.   Relevant Medications    hydrochlorothiazide (HYDRODIURIL) 25 MG tablet   losartan (COZAAR) 100 MG tablet   amLODipine (NORVASC) 10 MG tablet   carvedilol (COREG) 3.125 MG tablet   Other Relevant Orders   Basic metabolic panel (Completed)   Lipid Panel (Completed)   Screening for cholesterol level       Relevant Orders   Lipid Panel (Completed)      Meds ordered this encounter  Medications  . hydrochlorothiazide (HYDRODIURIL) 25 MG tablet    Sig: Take 1 tablet (25 mg total) by mouth daily.    Dispense:  90 tablet    Refill:  3    Must have office visit for refills.    Order Specific Question:   Supervising Provider    Answer:   Quentin AngstJEGEDE, OLUGBEMIGA E [id number]]  . losartan (COZAAR) 100 MG tablet    Sig: Take 1 tablet (100 mg total) by mouth daily.    Dispense:  90 tablet    Refill:  3    Must have office visit for refills    Order Specific Question:   Supervising Provider    Answer:   Quentin AngstJEGEDE, OLUGBEMIGA E [id number]]  . amLODipine (NORVASC) 10 MG tablet    Sig: Take 1 tablet (10 mg total) by mouth daily.    Dispense:  90 tablet    Refill:  3    Must have office visit for refills.    Order Specific Question:   Supervising Provider    Answer:   Quentin AngstJEGEDE, OLUGBEMIGA E [id number]]  . naproxen (NAPROSYN) 500 MG tablet    Sig: TAKE ONE TABLET BY MOUTH TWICE A DAY WITH MEALS FOR 10 DAYS. THEN ONE TABLET BY MOUTH ONCE DAILY WITH MEALS AS NEEDED.    Dispense:  60 tablet    Refill:  0    Order Specific Question:   Supervising Provider    Answer:   Quentin AngstJEGEDE, OLUGBEMIGA E [id number]]  . carvedilol (COREG) 3.125 MG tablet    Sig: Take 1 tablet (3.125 mg total) by mouth 2 (two) times daily with a meal.    Dispense:  60 tablet    Refill:  2    Order Specific Question:   Supervising Provider    Answer:   Quentin AngstJEGEDE, OLUGBEMIGA E [id number]]  . traMADol (ULTRAM) 50 MG tablet    Sig: Take 1 tablet (50 mg total) by mouth every 8 (eight) hours as needed for severe pain.    Dispense:  30 tablet    Refill:  0    No refills  without office visit    Order Specific Question:   Supervising Provider    Answer:   Quentin AngstJEGEDE, OLUGBEMIGA E [[id number]]    Follow-up: Return in about 2 weeks (around 07/22/2017) for BP check with Travia.   Lizbeth BarkMandesia R Christana Angelica FNP

## 2017-07-08 NOTE — Progress Notes (Signed)
Patient has not eaten  Patient has had medication

## 2017-07-09 ENCOUNTER — Ambulatory Visit: Payer: Self-pay | Attending: Family Medicine

## 2017-07-09 DIAGNOSIS — Z13220 Encounter for screening for lipoid disorders: Secondary | ICD-10-CM

## 2017-07-09 DIAGNOSIS — I10 Essential (primary) hypertension: Secondary | ICD-10-CM

## 2017-07-09 MED FILL — traMADol HCL 50 MG TABS: 50 | 10 days supply | Qty: 30 | Fill #0

## 2017-07-10 LAB — LIPID PANEL
CHOL/HDL RATIO: 3.9 ratio (ref 0.0–5.0)
Cholesterol, Total: 210 mg/dL — ABNORMAL HIGH (ref 100–199)
HDL: 54 mg/dL (ref >39)
LDL CALC: 132 mg/dL — AB (ref 0–99)
Triglycerides: 119 mg/dL (ref 0–149)
VLDL Cholesterol Cal: 24 mg/dL (ref 5–40)

## 2017-07-10 LAB — BASIC METABOLIC PANEL
BUN / CREAT RATIO: 8 — AB (ref 9–20)
BUN: 8 mg/dL (ref 6–24)
CHLORIDE: 100 mmol/L (ref 96–106)
CO2: 26 mmol/L (ref 20–29)
Calcium: 9.8 mg/dL (ref 8.7–10.2)
Creatinine, Ser: 1.04 mg/dL (ref 0.76–1.27)
GFR calc Af Amer: 93 mL/min/{1.73_m2} (ref >59)
GFR calc non Af Amer: 80 mL/min/{1.73_m2} (ref >59)
GLUCOSE: 112 mg/dL — AB (ref 65–99)
POTASSIUM: 4.2 mmol/L (ref 3.5–5.2)
SODIUM: 141 mmol/L (ref 134–144)

## 2017-07-17 ENCOUNTER — Telehealth: Payer: Self-pay

## 2017-07-17 ENCOUNTER — Other Ambulatory Visit: Payer: Self-pay | Admitting: Family Medicine

## 2017-07-17 DIAGNOSIS — E782 Mixed hyperlipidemia: Secondary | ICD-10-CM

## 2017-07-17 MED ORDER — ATORVASTATIN CALCIUM 20 MG PO TABS: 20.0000 mg | ORAL_TABLET | Freq: Every day | ORAL | 2 refills | Status: DC

## 2017-07-17 NOTE — Telephone Encounter (Signed)
CMA call regarding lab results   Patient no answer on home #   Patient no answer on mobile # left vm stating the reason of the call & to call me back

## 2017-07-17 NOTE — Telephone Encounter (Signed)
-----   Message from Lizbeth BarkMandesia R Hairston, [place] sent at 07/17/2017  9:10 AM EDT ----- Kidney function normal Labs that evaluated your blood cells, fluid and electrolyte balance are normal. Lipid levels were elevated. This can increase your risk of heart disease. You will be prescribed atorvastatin to help lower risk.  Start eating a diet low in saturated fat. Limit your intake of fried foods, red meats, and whole milk. Increase activity.

## 2017-07-27 ENCOUNTER — Ambulatory Visit: Payer: Self-pay | Attending: Family Medicine | Admitting: *Deleted

## 2017-07-27 VITALS — BP 150/70 | HR 62 | Resp 16

## 2017-07-27 DIAGNOSIS — Z048 Encounter for examination and observation for other specified reasons: Secondary | ICD-10-CM | POA: Insufficient documentation

## 2017-07-27 DIAGNOSIS — Z0130 Encounter for examination of blood pressure without abnormal findings: Secondary | ICD-10-CM

## 2017-07-27 DIAGNOSIS — I10 Essential (primary) hypertension: Secondary | ICD-10-CM

## 2017-07-27 MED ORDER — CARVEDILOL 6.25 MG PO TABS: 3.1250 mg | ORAL_TABLET | Freq: Two times a day (BID) | ORAL | 1 refills | Status: DC

## 2017-07-27 NOTE — Progress Notes (Signed)
Pt arrived to [facility]. Pt alert and oriented and arrives in good spirits. Last OV 07/08/2017 with PCP.  Pt denies chest pain, SOB, HA, dizziness, or blurred vision. Verified medication and  Pt states medication was taken this morning.  Manual blood pressure reading: 150/60  He states he is taking BP at home  BP range 130's  Over 70's. Today he has a lot of generalized pain d/t weather.     PLAN Increase dose of coreg to 6.25 mg BID  Recheck in 2 weeks with clinic RN

## 2017-07-27 NOTE — Patient Instructions (Signed)
PLAN Increase dose of coreg to 6.25 mg twice daily   Recheck in 2 weeks with clinic RN  Check heart rate prior to taking medication. If heart rate is 60 or lower, hold dose and call PCP

## 2017-08-11 ENCOUNTER — Ambulatory Visit: Payer: Self-pay | Attending: Family Medicine | Admitting: *Deleted

## 2017-08-11 ENCOUNTER — Other Ambulatory Visit: Payer: Self-pay | Admitting: *Deleted

## 2017-08-11 DIAGNOSIS — I10 Essential (primary) hypertension: Secondary | ICD-10-CM | POA: Insufficient documentation

## 2017-08-11 DIAGNOSIS — M159 Polyosteoarthritis, unspecified: Secondary | ICD-10-CM

## 2017-08-11 DIAGNOSIS — M150 Primary generalized (osteo)arthritis: Principal | ICD-10-CM

## 2017-08-11 MED ORDER — CARVEDILOL 6.25 MG PO TABS: 3.1250 mg | ORAL_TABLET | Freq: Two times a day (BID) | ORAL | 1 refills | Status: DC

## 2017-08-11 MED FILL — ?CARVEDILOL 6.25 MG TABLET: 6.25 | 30 days supply | Qty: 30 | Fill #0

## 2017-08-11 NOTE — Patient Instructions (Signed)
Patient [facility] and to take as prescribed.  Patient should return to the clinic in 2 weeks for another BP check.

## 2017-08-11 NOTE — Progress Notes (Signed)
Patient reports not taking BP medication today. Patient states he was unaware of BP medication being refilled to the pharmacy.

## 2017-08-19 ENCOUNTER — Telehealth: Payer: Self-pay

## 2017-08-19 MED ORDER — TRAMADOL HCL 50 MG PO TABS: 50.0000 mg | ORAL_TABLET | Freq: Three times a day (TID) | ORAL | 0 refills | Status: DC | PRN

## 2017-08-19 NOTE — Telephone Encounter (Signed)
CMA call regarding prescription refill is ready for pick up   Patient did not answer but left a VM stating the reason of the call & if have any questions just to call back

## 2017-09-07 MED FILL — CARVEDILOL 6.25 MG TABLET: 6.25 | 30 days supply | Qty: 30 | Fill #1

## 2017-09-24 ENCOUNTER — Other Ambulatory Visit: Payer: Self-pay | Admitting: Internal Medicine

## 2017-09-24 ENCOUNTER — Ambulatory Visit: Payer: Self-pay | Attending: Family Medicine | Admitting: *Deleted

## 2017-09-24 ENCOUNTER — Other Ambulatory Visit: Payer: Self-pay | Admitting: Family Medicine

## 2017-09-24 ENCOUNTER — Telehealth: Payer: Self-pay | Admitting: *Deleted

## 2017-09-24 VITALS — BP 160/78 | HR 52

## 2017-09-24 DIAGNOSIS — Z23 Encounter for immunization: Secondary | ICD-10-CM | POA: Insufficient documentation

## 2017-09-24 DIAGNOSIS — Z0130 Encounter for examination of blood pressure without abnormal findings: Secondary | ICD-10-CM

## 2017-09-24 DIAGNOSIS — I10 Essential (primary) hypertension: Secondary | ICD-10-CM | POA: Insufficient documentation

## 2017-09-24 DIAGNOSIS — M159 Polyosteoarthritis, unspecified: Secondary | ICD-10-CM

## 2017-09-24 DIAGNOSIS — M150 Primary generalized (osteo)arthritis: Principal | ICD-10-CM

## 2017-09-24 DIAGNOSIS — Z79899 Other long term (current) drug therapy: Secondary | ICD-10-CM | POA: Insufficient documentation

## 2017-09-24 MED FILL — NAPROXEN 500 MG TABLET: 500 | 30 days supply | Qty: 60 | Fill #0

## 2017-09-24 MED FILL — LOSARTAN POTASSIUM 100 MG T: 100 | 30 days supply | Qty: 30 | Fill #0

## 2017-09-24 MED FILL — HYDROCHLOROTHIAZIDE 25 MG T: 25 | 30 days supply | Qty: 30 | Fill #1

## 2017-09-24 MED FILL — AMLODIPINE BESYLATE 10 MG T: 10 | 30 days supply | Qty: 30 | Fill #1

## 2017-09-24 MED FILL — ?ATORVASTATIN 20 MG TABLET: 20 | 30 days supply | Qty: 30 | Fill #0

## 2017-09-24 NOTE — Telephone Encounter (Signed)
Pt arrived today for nurse visit While in office, he request pain medication RF: tramadol

## 2017-09-24 NOTE — Progress Notes (Signed)
[facility] Pt alert and oriented and arrives in good spirits. Pt here for nurse visit to check BP.  Pt denies chest pain, HA, dizziness, or blurred vision. "Just a little winded right now from pain and just having walked back from lobby".   Pt states he is only taking one of the four medications prescribed for blood pressure. He is unsure what he is taking. He states he was only given 2 from pharmacy  And that is what he has been taking but has only taken one medication this morning prior to OV.  He request a RF on the other BP medication. Instructed to call pharmacy, he has several RF pending.   Manual blood pressure reading: 160/78   Pt also request flu shot today.  Instructed patient to return for nurse visit: BP check in 2 weeks after taking all blood pressure medication. Given AVS with medications on it.

## 2017-10-20 MED FILL — ?ATORVASTATIN 20 MG TABLET: 20 | 30 days supply | Qty: 30 | Fill #1

## 2017-10-20 MED FILL — LOSARTAN POTASSIUM 100 MG T: 100 | 30 days supply | Qty: 30 | Fill #1

## 2017-10-20 MED FILL — AMLODIPINE BESYLATE 10 MG T: 10 | 30 days supply | Qty: 30 | Fill #2

## 2017-10-20 MED FILL — HYDROCHLOROTHIAZIDE 25 MG T: 25 | 30 days supply | Qty: 30 | Fill #2

## 2017-10-23 MED FILL — ?CARVEDILOL 6.25 MG TABLET: 6.25 | 30 days supply | Qty: 30 | Fill #2

## 2017-11-06 ENCOUNTER — Ambulatory Visit: Payer: Self-pay | Attending: Family Medicine

## 2017-11-23 MED FILL — LOSARTAN POTASSIUM 100 MG T: 100 | 30 days supply | Qty: 30 | Fill #2

## 2017-11-23 MED FILL — HYDROCHLOROTHIAZIDE 25 MG T: 25 | 30 days supply | Qty: 30 | Fill #3

## 2017-11-23 MED FILL — AMLODIPINE BESYLATE 10 MG T: 10 | 30 days supply | Qty: 30 | Fill #3

## 2017-11-23 MED FILL — ?CARVEDILOL 6.25 MG TABLET: 6.25 | 30 days supply | Qty: 30 | Fill #3

## 2017-11-23 MED FILL — ?ATORVASTATIN 20 MG TABLET: 20 | 30 days supply | Qty: 30 | Fill #2

## 2017-12-16 ENCOUNTER — Other Ambulatory Visit: Payer: Self-pay | Admitting: Family Medicine

## 2017-12-16 DIAGNOSIS — I10 Essential (primary) hypertension: Secondary | ICD-10-CM

## 2017-12-16 DIAGNOSIS — E782 Mixed hyperlipidemia: Secondary | ICD-10-CM

## 2017-12-16 MED FILL — AMLODIPINE BESYLATE 10 MG T: 10 | 30 days supply | Qty: 30 | Fill #4

## 2017-12-16 MED FILL — HYDROCHLOROTHIAZIDE 25 MG T: 25 | 30 days supply | Qty: 30 | Fill #4

## 2017-12-16 MED FILL — ?ATORVASTATIN 20MG TABLET: 20 | 30 days supply | Qty: 30 | Fill #0

## 2017-12-16 MED FILL — ?CARVEDILOL 6.25 MG TABLET: 6.25 | 30 days supply | Qty: 30 | Fill #0

## 2018-01-11 ENCOUNTER — Other Ambulatory Visit: Payer: Self-pay

## 2018-01-11 ENCOUNTER — Encounter: Payer: Self-pay | Admitting: Family Medicine

## 2018-01-11 ENCOUNTER — Other Ambulatory Visit: Payer: Self-pay | Admitting: Family Medicine

## 2018-01-11 ENCOUNTER — Ambulatory Visit (HOSPITAL_COMMUNITY)
Admission: RE | Admit: 2018-01-11 | Discharge: 2018-01-11 | Disposition: A | Discharge status: Home / Self Care | Payer: Self-pay | Source: Ambulatory Visit | Attending: Family Medicine | Admitting: Family Medicine

## 2018-01-11 ENCOUNTER — Ambulatory Visit: Payer: Self-pay | Attending: Family Medicine | Admitting: Family Medicine

## 2018-01-11 VITALS — BP 147/67 | HR 78 | Temp 98.4°F | Resp 16 | Ht 66.0 in | Wt 132.4 lb

## 2018-01-11 DIAGNOSIS — R0602 Shortness of breath: Secondary | ICD-10-CM | POA: Insufficient documentation

## 2018-01-11 DIAGNOSIS — F17200 Nicotine dependence, unspecified, uncomplicated: Secondary | ICD-10-CM

## 2018-01-11 DIAGNOSIS — M1990 Unspecified osteoarthritis, unspecified site: Secondary | ICD-10-CM | POA: Insufficient documentation

## 2018-01-11 DIAGNOSIS — M159 Polyosteoarthritis, unspecified: Secondary | ICD-10-CM | POA: Insufficient documentation

## 2018-01-11 DIAGNOSIS — R918 Other nonspecific abnormal finding of lung field: Secondary | ICD-10-CM | POA: Insufficient documentation

## 2018-01-11 DIAGNOSIS — R5383 Other fatigue: Secondary | ICD-10-CM | POA: Insufficient documentation

## 2018-01-11 DIAGNOSIS — Z79891 Long term (current) use of opiate analgesic: Secondary | ICD-10-CM | POA: Insufficient documentation

## 2018-01-11 DIAGNOSIS — Z125 Encounter for screening for malignant neoplasm of prostate: Secondary | ICD-10-CM

## 2018-01-11 DIAGNOSIS — M150 Primary generalized (osteo)arthritis: Secondary | ICD-10-CM

## 2018-01-11 DIAGNOSIS — F17210 Nicotine dependence, cigarettes, uncomplicated: Secondary | ICD-10-CM | POA: Insufficient documentation

## 2018-01-11 DIAGNOSIS — H547 Unspecified visual loss: Secondary | ICD-10-CM

## 2018-01-11 DIAGNOSIS — R52 Pain, unspecified: Secondary | ICD-10-CM | POA: Insufficient documentation

## 2018-01-11 DIAGNOSIS — I10 Essential (primary) hypertension: Secondary | ICD-10-CM | POA: Insufficient documentation

## 2018-01-11 DIAGNOSIS — Z79899 Other long term (current) drug therapy: Secondary | ICD-10-CM | POA: Insufficient documentation

## 2018-01-11 MED ORDER — TRAMADOL HCL 50 MG PO TABS: ORAL_TABLET | ORAL | 0 refills | Status: DC

## 2018-01-11 MED ORDER — DICLOFENAC SODIUM 1 % TD GEL: 2.0000 g | Freq: Four times a day (QID) | TRANSDERMAL | 2 refills | Status: DC | PRN

## 2018-01-11 MED ORDER — HYDROCHLOROTHIAZIDE 25 MG PO TABS: 25.0000 mg | ORAL_TABLET | Freq: Every day | ORAL | 0 refills | Status: DC

## 2018-01-11 MED ORDER — NAPROXEN 500 MG PO TABS: ORAL_TABLET | ORAL | 0 refills | Status: DC

## 2018-01-11 MED ORDER — AMLODIPINE BESYLATE 10 MG PO TABS: 10.0000 mg | ORAL_TABLET | Freq: Every day | ORAL | 0 refills | Status: DC

## 2018-01-11 MED ORDER — CARVEDILOL 12.5 MG PO TABS: ORAL_TABLET | ORAL | 2 refills | Status: DC

## 2018-01-11 MED ORDER — LOSARTAN POTASSIUM 100 MG PO TABS: 100.0000 mg | ORAL_TABLET | Freq: Every day | ORAL | 0 refills | Status: DC

## 2018-01-11 MED FILL — HYDROCHLOROTHIAZIDE 25 MG T: 25 | 30 days supply | Qty: 30 | Fill #0

## 2018-01-11 MED FILL — ?CARVEDILOL 12.5 MG TABLET: 12.5 | 30 days supply | Qty: 30 | Fill #0

## 2018-01-11 MED FILL — AMLODIPINE BESYLATE 10 MG T: 10 | 30 days supply | Qty: 30 | Fill #0

## 2018-01-11 MED FILL — LOSARTAN POTASSIUM 100 MG T: 100 | 30 days supply | Qty: 30 | Fill #0

## 2018-01-11 MED FILL — NAPROXEN 500 MG TABLET: 500 | 20 days supply | Qty: 40 | Fill #0

## 2018-01-11 MED FILL — DICLOFENAC SODIUM 1% GEL: 1 | 12 days supply | Qty: 100 | Fill #0

## 2018-01-11 MED FILL — traMADol HCL 50 MG TABS: 50 | 13 days supply | Qty: 40 | Fill #0

## 2018-01-11 NOTE — Progress Notes (Signed)
Subjective:  Patient ID: Ruben Fuentes, male    DOB: 01/15/1961  Age: 57 y.o. MRN: [id number]  CC: Hypertension and Pain   HPI Ruben Fuentes presents for hypertension follow up. PMH of htn and chronic osteoarthritis of the joints. Arthralgias for which has been present for a few years. Pain is located in multiple joints ( bilateral shoulders, hips, and knees) , is described as aching, and is constant . The patient has tried NSAIDs and narcotics in the past for pain, with moderate relief. Related to injury:no. He denies any family history of cancers, unexplained weight loss, or bloody stools.     Hypertension  Disease Monitoring  Blood pressure range: 140's SBP at home.     Chest pain: no   Dyspnea: yes , intermittment symptoms QOD at rest. Onset 1 month. Current smoker, he report smoking  6 cigarettes per day. Smoking history over 20 years. He is not ready to quit at this time.  Claudication: no   Medication compliance: yes  Medication Side Effects  Lightheadedness: no   Urinary frequency: no . He does report weak urine stream at times. Denies any hematuria or dysuria.  Edema: no   Impotence: no   Preventitive Healthcare:  Exercise: yes   Diet Pattern: low salt   Salt Restriction: no    Outpatient Medications Prior to Visit  Medication Sig Dispense Refill  . atorvastatin (LIPITOR) 20 MG tablet TAKE 1 TABLET BY MOUTH DAILY. 30 tablet 0  . amLODipine (NORVASC) 10 MG tablet Take 1 tablet (10 mg total) by mouth daily. 90 tablet 3  . carvedilol (COREG) 6.25 MG tablet TAKE 1/2 TABLET BY MOUTH 2 TIMES DAILY WITH A MEAL. 60 tablet 0  . hydrochlorothiazide (HYDRODIURIL) 25 MG tablet Take 1 tablet (25 mg total) by mouth daily. 90 tablet 3  . losartan (COZAAR) 100 MG tablet Take 1 tablet (100 mg total) by mouth daily. 90 tablet 3  . naproxen (NAPROSYN) 500 MG tablet TAKE 1 TABLET BY MOUTH TWICE A DAY WITH MEALS FOR 10 DAYS. THEN ONE TABLET BY MOUTH ONCE DAILY WITH MEALS AS NEEDED.  60 tablet 0  . traMADol (ULTRAM) 50 MG tablet TAKE 1 TABLET BY MOUTH EVERY 8 HOURS AS NEEDED FOR SEVERE PAIN 30 tablet 0   No facility-administered medications prior to visit.     ROS Review of Systems  Constitutional: Positive for fatigue.  Respiratory: Positive for shortness of breath.   Cardiovascular: Negative.   Gastrointestinal: Negative.   Musculoskeletal: Positive for arthralgias and myalgias.  Skin: Negative.   Psychiatric/Behavioral: Negative.     Objective:  BP (!) 147/67 (BP Location: Left Arm, Patient Position: Sitting, Cuff Size: Normal)   Pulse 78   Temp 98.4 F (36.9 C) (Oral)   Resp 16   Ht 5\' 6"  (1.676 m)   Wt 132 lb 6.4 oz (60.1 kg)   SpO2 96%   BMI 21.37 kg/m   BP/Weight 01/11/2018 09/24/2017 08/11/2017  Systolic BP 147 160 157  Diastolic BP 67 78 75  Wt. (Lbs) 132.4 - -  BMI 21.37 - -     Physical Exam  Constitutional: He appears well-developed and well-nourished.  Neck: No JVD present.  Cardiovascular: Normal rate, regular rhythm, normal heart sounds and intact distal pulses.  Pulmonary/Chest: Effort normal and breath sounds normal.  Abdominal: Soft. Bowel sounds are normal. There is no tenderness.  Neurological: He has normal strength.  Skin: Skin is warm and dry.  Psychiatric: He has a normal  mood and affect.  Nursing note and vitals reviewed.   Assessment & Plan:   1. Fatigue, unspecified type  - C-reactive protein - CK - Basic metabolic panel - CBC - DG Chest 2 View; Future  2. Chronic arthritis  - Ambulatory referral to Physical Therapy - naproxen (NAPROSYN) 500 MG tablet; TAKE 1 TABLET BY MOUTH TWICE A DAY WITH MEALS FOR 5 DAYS. THEN ONE TABLET BY MOUTH ONCE DAILY WITH MEALS AS NEEDED.  Dispense: 40 tablet; Refill: 0 - traMADol (ULTRAM) 50 MG tablet; TAKE 1 TABLET BY MOUTH EVERY 8 HOURS AS NEEDED FOR SEVERE PAIN  Dispense: 40 tablet; Refill: 0  3. Essential hypertension Follow up with clinical pharmacist in 2 weeks. -  carvedilol (COREG) 12.5 MG tablet; TAKE 1/2 TABLET BY MOUTH 2 TIMES DAILY WITH A MEAL.  Dispense: 60 tablet; Refill: 2 - amLODipine (NORVASC) 10 MG tablet; Take 1 tablet (10 mg total) by mouth daily.  Dispense: 90 tablet; Refill: 0 - hydrochlorothiazide (HYDRODIURIL) 25 MG tablet; Take 1 tablet (25 mg total) by mouth daily.  Dispense: 90 tablet; Refill: 0 - losartan (COZAAR) 100 MG tablet; Take 1 tablet (100 mg total) by mouth daily.  Dispense: 90 tablet; Refill: 0  4. Generalized body aches  - naproxen (NAPROSYN) 500 MG tablet; TAKE 1 TABLET BY MOUTH TWICE A DAY WITH MEALS FOR 5 DAYS. THEN ONE TABLET BY MOUTH ONCE DAILY WITH MEALS AS NEEDED.  Dispense: 40 tablet; Refill: 0  5. On statin therapy  - CK - Basic metabolic panel - Lipid Panel  6. Shortness of breath  - DG Chest 2 View; Future - EKG 12-Lead  7. Screening PSA (prostate specific antigen)  - PSA  8. Current smoker  - DG Chest 2 View; Future      Follow-up: Return in about 2 weeks (around 01/25/2018) for BP check with Stacy.   Lizbeth Bark FNP

## 2018-01-11 NOTE — Patient Instructions (Signed)
Managing Your Hypertension Hypertension is commonly called high blood pressure. This is when the force of your blood pressing against the walls of your arteries is too strong. Arteries are blood vessels that carry blood from your heart throughout your body. Hypertension forces the heart to work harder to pump blood, and may cause the arteries to become narrow or stiff. Having untreated or uncontrolled hypertension can cause heart attack, stroke, kidney disease, and other problems. What are blood pressure readings? A blood pressure reading consists of a higher number over a lower number. Ideally, your blood pressure should be below 120/80. The first ("top") number is called the systolic pressure. It is a measure of the pressure in your arteries as your heart beats. The second ("bottom") number is called the diastolic pressure. It is a measure of the pressure in your arteries as the heart relaxes. What does my blood pressure reading mean? Blood pressure is classified into four stages. Based on your blood pressure reading, your health care provider may use the following stages to determine what type of treatment you need, if any. Systolic pressure and diastolic pressure are measured in a unit called mm Hg. Normal  Systolic pressure: below 120.  Diastolic pressure: below 80. Elevated  Systolic pressure: 120-129.  Diastolic pressure: below 80. Hypertension stage 1  Systolic pressure: 130-139.  Diastolic pressure: 80-89. Hypertension stage 2  Systolic pressure: 140 or above.  Diastolic pressure: 90 or above. What health risks are associated with hypertension? Managing your hypertension is an important responsibility. Uncontrolled hypertension can lead to:  A heart attack.  A stroke.  A weakened blood vessel (aneurysm).  Heart failure.  Kidney damage.  Eye damage.  Metabolic syndrome.  Memory and concentration problems.  What changes can I make to manage my  hypertension? Hypertension can be managed by making lifestyle changes and possibly by taking medicines. Your health care provider will help you make a plan to bring your blood pressure within a normal range. Eating and drinking  Eat a diet that is high in fiber and potassium, and low in salt (sodium), added sugar, and fat. An example eating plan is called the DASH (Dietary Approaches to Stop Hypertension) diet. To eat this way: ? Eat plenty of fresh fruits and vegetables. Try to fill half of your plate at each meal with fruits and vegetables. ? Eat whole grains, such as whole wheat pasta, brown rice, or whole grain bread. Fill about one quarter of your plate with whole grains. ? Eat low-fat diary products. ? Avoid fatty cuts of meat, processed or cured meats, and poultry with skin. Fill about one quarter of your plate with lean proteins such as fish, chicken without skin, beans, eggs, and tofu. ? Avoid premade and processed foods. These tend to be higher in sodium, added sugar, and fat.  Reduce your daily sodium intake. Most people with hypertension should eat less than 1,500 mg of sodium a day.  Limit alcohol intake to no more than 1 drink a day for nonpregnant women and 2 drinks a day for men. One drink equals 12 oz of beer, 5 oz of wine, or 1 oz of hard liquor. Lifestyle  Work with your health care provider to maintain a healthy body weight, or to lose weight. Ask what an ideal weight is for you.  Get at least 30 minutes of exercise that causes your heart to beat faster (aerobic exercise) most days of the week. Activities may include walking, swimming, or biking.  Include exercise   to strengthen your muscles (resistance exercise), such as weight lifting, as part of your weekly exercise routine. Try to do these types of exercises for 30 minutes at least 3 days a week.  Do not use any products that contain nicotine or tobacco, such as cigarettes and e-cigarettes. If you need help quitting, ask  your health care provider.  Control any long-term (chronic) conditions you have, such as high cholesterol or diabetes. Monitoring  Monitor your blood pressure at home as told by your health care provider. Your personal target blood pressure may vary depending on your medical conditions, your age, and other factors.  Have your blood pressure checked regularly, as often as told by your health care provider. Working with your health care provider  Review all the medicines you take with your health care provider because there may be side effects or interactions.  Talk with your health care provider about your diet, exercise habits, and other lifestyle factors that may be contributing to hypertension.  Visit your health care provider regularly. Your health care provider can help you create and adjust your plan for managing hypertension. Will I need medicine to control my blood pressure? Your health care provider may prescribe medicine if lifestyle changes are not enough to get your blood pressure under control, and if:  Your systolic blood pressure is 130 or higher.  Your diastolic blood pressure is 80 or higher.  Take medicines only as told by your health care provider. Follow the directions carefully. Blood pressure medicines must be taken as prescribed. The medicine does not work as well when you skip doses. Skipping doses also puts you at risk for problems. Contact a health care provider if:  You think you are having a reaction to medicines you have taken.  You have repeated (recurrent) headaches.  You feel dizzy.  You have swelling in your ankles.  You have trouble with your vision. Get help right away if:  You develop a severe headache or confusion.  You have unusual weakness or numbness, or you feel faint.  You have severe pain in your chest or abdomen.  You vomit repeatedly.  You have trouble breathing. Summary  Hypertension is when the force of blood pumping through  your arteries is too strong. If this condition is not controlled, it may put you at risk for serious complications.  Your personal target blood pressure may vary depending on your medical conditions, your age, and other factors. For most people, a normal blood pressure is less than 120/80.  Hypertension is managed by lifestyle changes, medicines, or both. Lifestyle changes include weight loss, eating a healthy, low-sodium diet, exercising more, and limiting alcohol. This information is not intended to replace advice given to you by your health care provider. Make sure you discuss any questions you have with your health care provider. Document Released: 08/18/2012 Document Revised: 10/22/2016 Document Reviewed: 10/22/2016 Elsevier Interactive Patient Education  2018 [employer]  

## 2018-01-11 NOTE — Progress Notes (Signed)
Patient is here for a blood pressure check.   Patient stated he have pain all over his body and think it may be due to arthritis.   Patient's wife stated he have bad muscle pain. Patient's would like cancer screening due to being a smoker and referral to the ophthalmology.

## 2018-01-12 ENCOUNTER — Other Ambulatory Visit: Payer: Self-pay | Admitting: Family Medicine

## 2018-01-12 DIAGNOSIS — R0989 Other specified symptoms and signs involving the circulatory and respiratory systems: Secondary | ICD-10-CM

## 2018-01-12 DIAGNOSIS — E782 Mixed hyperlipidemia: Secondary | ICD-10-CM

## 2018-01-12 DIAGNOSIS — R0602 Shortness of breath: Secondary | ICD-10-CM

## 2018-01-12 LAB — CK: Total CK: 166 U/L (ref 24–204)

## 2018-01-12 LAB — BASIC METABOLIC PANEL
BUN / CREAT RATIO: 8 — AB (ref 9–20)
BUN: 11 mg/dL (ref 6–24)
CALCIUM: 9.5 mg/dL (ref 8.7–10.2)
CHLORIDE: 94 mmol/L — AB (ref 96–106)
CO2: 24 mmol/L (ref 20–29)
CREATININE: 1.3 mg/dL — AB (ref 0.76–1.27)
GFR calc Af Amer: 71 mL/min/{1.73_m2} (ref >59)
GFR calc non Af Amer: 61 mL/min/{1.73_m2} (ref >59)
GLUCOSE: 92 mg/dL (ref 65–99)
Potassium: 4.1 mmol/L (ref 3.5–5.2)
Sodium: 135 mmol/L (ref 134–144)

## 2018-01-12 LAB — PSA: Prostate Specific Ag, Serum: 1.9 ng/mL (ref 0.0–4.0)

## 2018-01-12 LAB — LIPID PANEL
CHOLESTEROL TOTAL: 156 mg/dL (ref 100–199)
Chol/HDL Ratio: 3.3 ratio (ref 0.0–5.0)
HDL: 48 mg/dL (ref >39)
LDL CALC: 88 mg/dL (ref 0–99)
TRIGLYCERIDES: 99 mg/dL (ref 0–149)
VLDL Cholesterol Cal: 20 mg/dL (ref 5–40)

## 2018-01-12 LAB — CBC
HEMATOCRIT: 32.8 % — AB (ref 37.5–51.0)
Hemoglobin: 11.1 g/dL — ABNORMAL LOW (ref 13.0–17.7)
MCH: 29.7 pg (ref 26.6–33.0)
MCHC: 33.8 g/dL (ref 31.5–35.7)
MCV: 88 fL (ref 79–97)
Platelets: 274 10*3/uL (ref 150–379)
RBC: 3.74 x10E6/uL — AB (ref 4.14–5.80)
RDW: 15.5 % — AB (ref 12.3–15.4)
WBC: 6.4 10*3/uL (ref 3.4–10.8)

## 2018-01-12 LAB — C-REACTIVE PROTEIN: CRP: 1.1 mg/L (ref 0.0–4.9)

## 2018-01-12 MED ORDER — ATORVASTATIN CALCIUM 20 MG PO TABS: 20.0000 mg | ORAL_TABLET | Freq: Every day | ORAL | 1 refills | Status: DC

## 2018-01-12 MED ORDER — ALBUTEROL SULFATE HFA 108 (90 BASE) MCG/ACT IN AERS: 2.0000 | INHALATION_SPRAY | Freq: Four times a day (QID) | RESPIRATORY_TRACT | 2 refills | Status: DC | PRN

## 2018-01-12 MED FILL — !VENTOLIN HFA INHALER: 108 (90 BAS | 25 days supply | Qty: 18 | Fill #0

## 2018-01-12 MED FILL — ?ATORVASTATIN 20 MG TABLET: 20 | 30 days supply | Qty: 30 | Fill #0

## 2018-01-22 ENCOUNTER — Telehealth (INDEPENDENT_AMBULATORY_CARE_PROVIDER_SITE_OTHER): Payer: Self-pay | Admitting: *Deleted

## 2018-01-22 NOTE — Telephone Encounter (Signed)
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 You will be referred to pulmonology. You will be prescribed an inhaler. Creatine slightly elevated , kidney function is normal. Increase water intake.  Continue to take your medications for blood pressure. Encourage quitting smoking.  Lab that evaluate muscle breakdown is normal. Lab that evaluate inflammation is normal. Labs normal. Cholesterol levels normal on current dosage of atorvastatin.

## 2018-01-22 NOTE — Telephone Encounter (Signed)
Medical Assistant left message on patient's home and cell voicemail. Voicemail states to give a call back [place] [facility] at [phone]. 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 Patient has been referred to pulmonology and [facility]. Patient kidney function is normal but showing some protein, patient needs to increase water intake and take medications as prescribed and to quit smoking. Patient should continue with atorvastatin!!!

## 2018-02-02 ENCOUNTER — Ambulatory Visit: Payer: Self-pay | Attending: Family Medicine | Admitting: *Deleted

## 2018-02-02 ENCOUNTER — Other Ambulatory Visit: Payer: Self-pay

## 2018-02-02 VITALS — BP 108/65 | HR 53

## 2018-02-02 DIAGNOSIS — F17210 Nicotine dependence, cigarettes, uncomplicated: Secondary | ICD-10-CM | POA: Insufficient documentation

## 2018-02-02 DIAGNOSIS — I10 Essential (primary) hypertension: Secondary | ICD-10-CM | POA: Insufficient documentation

## 2018-02-02 NOTE — Progress Notes (Signed)
[facility] Pt alert and oriented and arrives in good spirits. Blood pressure at last OV  01/11/2018 with  PCP was 147/67 .  Pt denies chest pain, SOB, HA, dizziness, or blurred vision.  Verified medication with patient. Pt states he has cut back on smoking. He now smokes 2 cigarettes daily. He is trying to stop smoking those two as well.   He states medication was taken this morning.  Blood reading: 108/65  Pt aware of results from 01/11/2018. Number was given so he can return call to [facility].

## 2018-02-02 NOTE — Patient Instructions (Signed)
[facility]-  [phone]

## 2018-02-08 ENCOUNTER — Institutional Professional Consult (permissible substitution): Payer: Self-pay | Admitting: Internal Medicine

## 2018-02-09 ENCOUNTER — Telehealth: Payer: Self-pay | Admitting: Family Medicine

## 2018-02-09 NOTE — Telephone Encounter (Signed)
Patients wife picked up the papers from november 2018.

## 2018-02-12 ENCOUNTER — Ambulatory Visit: Payer: Self-pay | Attending: Internal Medicine

## 2018-02-12 MED FILL — HYDROCHLOROTHIAZIDE 25 MG T: 25 | 30 days supply | Qty: 30 | Fill #1

## 2018-02-12 MED FILL — ?CARVEDILOL 12.5 MG TABLET: 12.5 | 30 days supply | Qty: 30 | Fill #1

## 2018-02-12 MED FILL — ?ATORVASTATIN 20 MG TABLET: 20 | 30 days supply | Qty: 30 | Fill #1

## 2018-03-09 ENCOUNTER — Ambulatory Visit (INDEPENDENT_AMBULATORY_CARE_PROVIDER_SITE_OTHER): Payer: Self-pay | Admitting: Internal Medicine

## 2018-03-09 ENCOUNTER — Encounter: Payer: Self-pay | Admitting: Internal Medicine

## 2018-03-09 VITALS — BP 132/70 | HR 63 | Ht 66.0 in | Wt 136.0 lb

## 2018-03-09 DIAGNOSIS — F17210 Nicotine dependence, cigarettes, uncomplicated: Secondary | ICD-10-CM | POA: Insufficient documentation

## 2018-03-09 DIAGNOSIS — J449 Chronic obstructive pulmonary disease, unspecified: Secondary | ICD-10-CM | POA: Insufficient documentation

## 2018-03-09 MED FILL — AMLODIPINE BESYLATE 10 MG T: 10 | 30 days supply | Qty: 30 | Fill #1

## 2018-03-09 MED FILL — ?CARVEDILOL 12.5 MG TABLET: 12.5 | 30 days supply | Qty: 30 | Fill #2

## 2018-03-09 MED FILL — LOSARTAN POTASSIUM 100 MG T: 100 | 30 days supply | Qty: 30 | Fill #1

## 2018-03-09 MED FILL — ?ATORVASTATIN 20 MG TABLET: 20 | 30 days supply | Qty: 30 | Fill #2

## 2018-03-09 NOTE — Patient Instructions (Addendum)
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

## 2018-03-09 NOTE — Progress Notes (Signed)
Subjective:     Patient ID: Ruben Fuentes, male   DOB: 08/31/1961,    MRN: [id number]  HPI  [age] yobm active smoker with new onset doe x end 2018 better with saba and referred to pulmonary clinic 03/09/2018 by  Arrie SenateMandesia Hairston    03/09/2018 1st [facility] office visit/ Braylie Badami   Chief Complaint  Patient presents with  . Pulmonary Consult    Referred by Dr. Arrie SenateMandesia Hairston. Pt c/o SOB for the past 4-6 months. He states he gets SOB after he walks for approx 1/2 mile. He is using his albuterol inhaler 2 x daily on average.   indolent onset doe = MMRC1 = can walk nl pace, flat grade, can't hurry or go uphills or steps s sob  Some better p saba/ also limited by "aches all over"  No noct sob/ cough or need for noct saba  Both breathing and aches worse in cold weather   No obvious day to day or daytime variability or assoc excess/ purulent sputum or mucus plugs or hemoptysis or cp or chest tightness, subjective wheeze or overt sinus or hb symptoms. No unusual exposure hx or h/o childhood pna/ asthma or knowledge of premature birth.  Sleeping ok flat without nocturnal  or early am exacerbation  of respiratory  c/o's or need for noct saba. Also denies any obvious fluctuation of symptoms with weather or environmental changes or other aggravating or alleviating factors except as outlined above   Current Allergies, Complete Past Medical History, Past Surgical History, Family History, and Social History were reviewed in [facility] electronic medical record.  ROS  The following are not active complaints unless bolded Hoarseness, sore throat, dysphagia, dental problems, itching, sneezing,  nasal congestion or discharge of excess mucus or purulent secretions, ear ache,   fever, chills, sweats, unintended wt loss or wt gain, classically pleuritic or exertional cp,  orthopnea pnd or leg swelling, presyncope, palpitations, abdominal pain, anorexia, nausea, vomiting, diarrhea  or change in bowel  habits or change in bladder habits, change in stools or change in urine, dysuria, hematuria,  rash, arthralgias, visual complaints, headache, numbness, weakness or ataxia or problems with walking or coordination,  change in mood/affect or memory.        Current Meds  Medication Sig  . albuterol (PROVENTIL HFA;VENTOLIN HFA) 108 (90 Base) MCG/ACT inhaler Inhale 2 puffs into the lungs every 6 (six) hours as needed for wheezing or shortness of breath.  Marland Kitchen. amLODipine (NORVASC) 10 MG tablet Take 1 tablet (10 mg total) by mouth daily.  Marland Kitchen. atorvastatin (LIPITOR) 20 MG tablet Take 1 tablet (20 mg total) by mouth daily.  . carvedilol (COREG) 12.5 MG tablet TAKE 1/2 TABLET BY MOUTH 2 TIMES DAILY WITH A MEAL.  Marland Kitchen. diclofenac sodium (VOLTAREN) 1 % GEL Apply 2 g topically 4 (four) times daily as needed. Apply to affected areas.  . hydrochlorothiazide (HYDRODIURIL) 25 MG tablet Take 1 tablet (25 mg total) by mouth daily.  Marland Kitchen. losartan (COZAAR) 100 MG tablet Take 1 tablet (100 mg total) by mouth daily.  . naproxen (NAPROSYN) 500 MG tablet TAKE 1 TABLET BY MOUTH TWICE A DAY WITH MEALS FOR 5 DAYS. THEN ONE TABLET BY MOUTH ONCE DAILY WITH MEALS AS NEEDED.          Review of Systems     Objective:   Physical Exam Poor dentition    amb thin bm nad   Wt Readings from Last 3 Encounters:  03/09/18 136 lb (61.7 kg)  01/11/18 132 lb 6.4 oz (60.1 kg)  07/08/17 133 lb 9.6 oz (60.6 kg)     Vital signs reviewed - Note on arrival 02 sats  100% on RA      HEENT: nl dentition / oropharynx. Nl external ear canals without cough reflex - moderate bilateral non-specific turbinate edema     NECK :  without JVD/Nodes/TM/ nl carotid upstrokes bilaterally   LUNGS: no acc muscle use,  Mod barrel  contour chest wall with bilateral  Distant bs s audible wheeze and  without cough on insp or exp maneuver and mod   Hyperresonant  to  percussion bilaterally     CV:  RRR  no s3 or murmur or increase in P2, and no edema    ABD:  soft and nontender with pos mid insp Hoover's  in the supine position. No bruits or organomegaly appreciated, bowel sounds nl  MS:   Nl gait/  ext warm without deformities, calf tenderness, cyanosis or clubbing No obvious joint restrictions   SKIN: warm and dry without lesions    NEURO:  alert, approp, nl sensorium with  no motor or cerebellar deficits apparent.     I personally reviewed images and agree with radiology impression as follows:  CXR:   01/11/18  No active cardiopulmonary disease. Hyperinflation of the lungs is noted        Assessment:

## 2018-03-09 NOTE — Assessment & Plan Note (Signed)

## 2018-03-09 NOTE — Assessment & Plan Note (Signed)
Spirometry 03/09/2018  FEV1 2.06 (75%)  Ratio 62 w/in 4 h of saba with mild curvature      I reviewed the Fletcher curve with the patient and wife  that basically indicates  if you quit smoking when your best day FEV1 is still   preserved (as is relatively still   the case here)  it is highly unlikely you will progress to severe disease and informed the patient there was  no medication on the market that has proven to alter the curve/ its downward trajectory  or the likelihood of progression of their disease(unlike other chronic medical conditions such as atheroclerosis where we do think we can change the natural hx with risk reducing meds)    Therefore stopping smoking and maintaining abstinence are  the most important aspects of care, not choice of inhalers or for that matter, doctors.   Treatment other than smoking cessation  is entirely directed by severity of symptoms and focused also on reducing exacerbations, not attempting to change the natural history of the disease.   He really is not having exac tendency  and walking test today showed more slowed down by legs so ok to just use prn saba an work on smoking and pulmonary f/u can be prn    Total time devoted to counseling  > 50 % of initial 60 min office visit:  review case with pt/wife Thurston HoleAnne discussion of options/alternatives/ personally creating written customized instructions  in presence of pt  then going over those specific  Instructions directly with the pt including how to use all of the meds but in particular covering each new medication in detail and the difference between the maintenance= "automatic" meds and the prns using an action plan format for the latter (If this problem/symptom => do that organization reading Left to right).  Please see AVS from this visit for a full list of these instructions which I personally wrote for this pt and  are unique to this visit.

## 2018-05-17 ENCOUNTER — Ambulatory Visit: Payer: Self-pay | Attending: Nurse Practitioner | Admitting: Nurse Practitioner

## 2018-05-17 ENCOUNTER — Encounter: Payer: Self-pay | Admitting: Nurse Practitioner

## 2018-05-17 DIAGNOSIS — J449 Chronic obstructive pulmonary disease, unspecified: Secondary | ICD-10-CM | POA: Insufficient documentation

## 2018-05-17 DIAGNOSIS — Z79899 Other long term (current) drug therapy: Secondary | ICD-10-CM | POA: Insufficient documentation

## 2018-05-17 DIAGNOSIS — Z833 Family history of diabetes mellitus: Secondary | ICD-10-CM | POA: Insufficient documentation

## 2018-05-17 DIAGNOSIS — I10 Essential (primary) hypertension: Secondary | ICD-10-CM | POA: Insufficient documentation

## 2018-05-17 DIAGNOSIS — R0602 Shortness of breath: Secondary | ICD-10-CM | POA: Insufficient documentation

## 2018-05-17 DIAGNOSIS — F17200 Nicotine dependence, unspecified, uncomplicated: Secondary | ICD-10-CM | POA: Insufficient documentation

## 2018-05-17 DIAGNOSIS — M1990 Unspecified osteoarthritis, unspecified site: Secondary | ICD-10-CM | POA: Insufficient documentation

## 2018-05-17 DIAGNOSIS — Z8249 Family history of ischemic heart disease and other diseases of the circulatory system: Secondary | ICD-10-CM | POA: Insufficient documentation

## 2018-05-17 DIAGNOSIS — E782 Mixed hyperlipidemia: Secondary | ICD-10-CM | POA: Insufficient documentation

## 2018-05-17 DIAGNOSIS — Z8261 Family history of arthritis: Secondary | ICD-10-CM | POA: Insufficient documentation

## 2018-05-17 MED ORDER — ATORVASTATIN CALCIUM 20 MG PO TABS: 20.0000 mg | ORAL_TABLET | Freq: Every day | ORAL | 1 refills | Status: DC

## 2018-05-17 MED ORDER — AMLODIPINE BESYLATE 10 MG PO TABS: 10.0000 mg | ORAL_TABLET | Freq: Every day | ORAL | 0 refills | Status: DC

## 2018-05-17 MED ORDER — FLUTICASONE PROPIONATE HFA 110 MCG/ACT IN AERO: 1.0000 | INHALATION_SPRAY | Freq: Every day | RESPIRATORY_TRACT | 12 refills | Status: DC

## 2018-05-17 MED ORDER — FLUTICASONE PROPIONATE HFA 44 MCG/ACT IN AERO
2.0000 | INHALATION_SPRAY | Freq: Two times a day (BID) | RESPIRATORY_TRACT | 12 refills | Status: DC
Start: 2018-05-17 — End: 2018-05-17

## 2018-05-17 MED ORDER — DICLOFENAC SODIUM 1 % TD GEL: 2.0000 g | Freq: Four times a day (QID) | TRANSDERMAL | 2 refills | Status: DC | PRN

## 2018-05-17 MED ORDER — NAPROXEN 500 MG PO TABS: ORAL_TABLET | ORAL | 0 refills | Status: DC

## 2018-05-17 MED ORDER — ALBUTEROL SULFATE HFA 108 (90 BASE) MCG/ACT IN AERS: 2.0000 | INHALATION_SPRAY | Freq: Four times a day (QID) | RESPIRATORY_TRACT | 2 refills | Status: DC | PRN

## 2018-05-17 MED ORDER — LOSARTAN POTASSIUM 100 MG PO TABS: 100.0000 mg | ORAL_TABLET | Freq: Every day | ORAL | 0 refills | Status: DC

## 2018-05-17 MED FILL — !FLOVENT HFA 44 MCG INHALER: 44 MCG | 30 days supply | Qty: 1 | Fill #0

## 2018-05-17 MED FILL — !VENTOLIN HFA INHALER: 108 (90 BAS | 25 days supply | Qty: 18 | Fill #0

## 2018-05-17 MED FILL — NAPROXEN 500 MG TABLET: 500 | 30 days supply | Qty: 35 | Fill #0

## 2018-05-17 NOTE — Progress Notes (Signed)
Assessment & Plan:  Ruben Fuentes was seen today for establish care and medication refill.  Diagnoses and all orders for this visit:  Essential hypertension -     CBC -     CMP14+EGFR -     losartan (COZAAR) 100 MG tablet; Take 1 tablet (100 mg total) by mouth daily. -     amLODipine (NORVASC) 10 MG tablet; Take 1 tablet (10 mg total) by mouth daily.  Continue all antihypertensives as prescribed.  Remember to bring in your blood pressure log with you for your follow up appointment.  DASH/Mediterranean Diets are healthier choices for HTN.  Will adjust BP meds once renal function levels obtained.   Hyperlipidemia, mixed -     atorvastatin (LIPITOR) 20 MG tablet; Take 1 tablet (20 mg total) by mouth daily. INSTRUCTIONS: Work on a low fat, heart healthy diet and participate in regular aerobic exercise program by working out at least 150 minutes per week. No fried foods. No junk foods, sodas, sugary drinks, unhealthy snacking, alcohol or smoking.     Chronic arthritis -     naproxen (NAPROSYN) 500 MG tablet; TAKE 1 TABLET BY MOUTH TWICE A DAY WITH MEALS FOR 5 DAYS. THEN ONE TABLET BY MOUTH ONCE DAILY WITH MEALS AS NEEDED. -     Ambulatory referral to Pain Clinic -     diclofenac sodium (VOLTAREN) 1 % GEL; Apply 2 g topically 4 (four) times daily as needed. Apply to affected areas.  Shortness of breath -     albuterol (PROVENTIL HFA;VENTOLIN HFA) 108 (90 Base) MCG/ACT inhaler; Inhale 2 puffs into the lungs every 6 (six) hours as needed for wheezing or shortness of breath.  Patient has been counseled on age-appropriate routine health concerns for screening and prevention. These are reviewed and up-to-date. Referrals have been placed accordingly. Immunizations are up-to-date or declined.    Subjective:   Chief Complaint  Patient presents with  . Establish Care    pt. is here to establish care for hypertension.   . Medication Refill   HPI Ruben Fuentes 57 y.o. male presents to office today  to establish care  Essential Hypertension Chronic.Not well controlled today. Will refill amlodipine 30m and losartan 1085mdaily.  He does not take his medications daily as prescribed. Although he has been out of his carvedilol for a few months his heart rate is somewhat low. I will not refill his coreg or HCTZ as his most recent renal function results showed some decline. Denies chest pain, shortness of breath, palpitations, lightheadedness, dizziness, headaches or BLE edema.  BP Readings from Last 3 Encounters:  05/17/18 (!) 188/83  03/09/18 132/70  02/02/18 108/65   Osteoarthritis  Chronic. Bilateral shoulder blades, wrists, hands and knees. He has taken tramadol in the past with significant relief of symptoms however he does report current use of marijuana so we had a long discussion regarding why I would not be able to prescribe this for him. He verbalized understanding. Will continue voltaren gel and naproxen at this time and refer to pain management.   Hyperlipidemia Patient presents for follow up to hyperlipidemia.  He is medication compliant. He is diet compliant and denies lower extremity edema, poor exercise tolerance and skin xanthelasma or statin intolerance including myalgias.  Lab Results  Component Value Date   CHOL 156 01/11/2018   Lab Results  Component Value Date   HDL 48 01/11/2018   Lab Results  Component Value Date   LDLCALC 88 01/11/2018  Lab Results  Component Value Date   TRIG 99 01/11/2018   Lab Results  Component Value Date   CHOLHDL 3.3 01/11/2018    MILD COPD Chronic. He was seen by Pulmonology in April and was instructed to quit smoking (he has not) and continue use of saba prn.  Today he endorses daily use of his albuterol inhaler. When I questioned what symptoms he was experiencing that warranted using his albuterol daily he could recall exactly why he is using his inhaler. I instructed him to only use his inhaler for cough wheezing or shortness  of breath. All of which he denies today.   Review of Systems  Constitutional: Negative for fever, malaise/fatigue and weight loss.  HENT: Negative.  Negative for nosebleeds.   Eyes: Negative.  Negative for blurred vision, double vision and photophobia.  Respiratory: Negative.  Negative for cough and shortness of breath.   Cardiovascular: Negative.  Negative for chest pain, palpitations and leg swelling.  Gastrointestinal: Negative.  Negative for heartburn, nausea and vomiting.  Musculoskeletal: Positive for back pain, joint pain and myalgias.  Neurological: Negative.  Negative for dizziness, focal weakness, seizures and headaches.  Psychiatric/Behavioral: Negative.  Negative for suicidal ideas.    Past Medical History:  Diagnosis Date  . Arthritis   . Bursitis   . Hypertension     Past Surgical History:  Procedure Laterality Date  . FRACTURE SURGERY     collar bones  . FRACTURE SURGERY     right ankle    Family History  Problem Relation Age of Onset  . Hypertension Father   . Rheum arthritis Sister   . Diabetes Brother   . Hypertension Mother   . Colon cancer Neg Hx     Social History Reviewed with no changes to be made today.   Outpatient Medications Prior to Visit  Medication Sig Dispense Refill  . albuterol (PROVENTIL HFA;VENTOLIN HFA) 108 (90 Base) MCG/ACT inhaler Inhale 2 puffs into the lungs every 6 (six) hours as needed for wheezing or shortness of breath. 1 Inhaler 2  . amLODipine (NORVASC) 10 MG tablet Take 1 tablet (10 mg total) by mouth daily. 90 tablet 0  . atorvastatin (LIPITOR) 20 MG tablet Take 1 tablet (20 mg total) by mouth daily. 90 tablet 1  . carvedilol (COREG) 12.5 MG tablet TAKE 1/2 TABLET BY MOUTH 2 TIMES DAILY WITH A MEAL. 60 tablet 2  . diclofenac sodium (VOLTAREN) 1 % GEL Apply 2 g topically 4 (four) times daily as needed. Apply to affected areas. 100 g 2  . hydrochlorothiazide (HYDRODIURIL) 25 MG tablet Take 1 tablet (25 mg total) by mouth  daily. 90 tablet 0  . losartan (COZAAR) 100 MG tablet Take 1 tablet (100 mg total) by mouth daily. 90 tablet 0  . naproxen (NAPROSYN) 500 MG tablet TAKE 1 TABLET BY MOUTH TWICE A DAY WITH MEALS FOR 5 DAYS. THEN ONE TABLET BY MOUTH ONCE DAILY WITH MEALS AS NEEDED. 40 tablet 0  . traMADol (ULTRAM) 50 MG tablet TAKE 1 TABLET BY MOUTH EVERY 8 HOURS AS NEEDED FOR SEVERE PAIN (Patient not taking: Reported on 03/09/2018) 40 tablet 0   No facility-administered medications prior to visit.     No Known Allergies     Objective:    BP (!) 188/83 (BP Location: Left Arm, Patient Position: Sitting, Cuff Size: Normal)   Pulse (!) 53   Temp 98.4 F (36.9 C) (Oral)   Ht '5\' 7"'  (1.702 m)   Wt 125 lb  9.6 oz (57 kg)   SpO2 100%   BMI 19.67 kg/m  Wt Readings from Last 3 Encounters:  05/17/18 125 lb 9.6 oz (57 kg)  03/09/18 136 lb (61.7 kg)  01/11/18 132 lb 6.4 oz (60.1 kg)    Physical Exam  Constitutional: He is oriented to person, place, and time. He appears well-developed and well-nourished. He is cooperative.  HENT:  Head: Normocephalic and atraumatic.  Eyes: EOM are normal.  Neck: Normal range of motion.  Cardiovascular: Regular rhythm and normal heart sounds. Bradycardia present. Exam reveals no gallop and no friction rub.  No murmur heard. Pulmonary/Chest: Effort normal and breath sounds normal. No tachypnea. No respiratory distress. He has no decreased breath sounds. He has no wheezes. He has no rhonchi. He has no rales. He exhibits no tenderness.  Abdominal: Soft. Bowel sounds are normal.  Musculoskeletal: Normal range of motion. He exhibits no edema.  Neurological: He is alert and oriented to person, place, and time. Coordination normal.  Skin: Skin is warm and dry.  Psychiatric: He has a normal mood and affect. His behavior is normal. Judgment and thought content normal.  Nursing note and vitals reviewed.      Patient has been counseled extensively about nutrition and exercise as well  as the importance of adherence with medications and regular follow-up. The patient was given clear instructions to go to ER or return to medical center if symptoms don't improve, worsen or new problems develop. The patient verbalized understanding.   Follow-up: Return in about 3 weeks (around 06/07/2018) for BP recheck.   Gildardo Pounds, FNP-BC [facility] and [facility], [place]   05/17/2018, 1:11 PM

## 2018-05-18 LAB — CMP14+EGFR
A/G RATIO: 1.7 (ref 1.2–2.2)
ALT: 35 IU/L (ref 0–44)
AST: 38 IU/L (ref 0–40)
Albumin: 5 g/dL (ref 3.5–5.5)
Alkaline Phosphatase: 92 IU/L (ref 39–117)
BILIRUBIN TOTAL: 0.3 mg/dL (ref 0.0–1.2)
BUN/Creatinine Ratio: 9 (ref 9–20)
BUN: 11 mg/dL (ref 6–24)
CHLORIDE: 98 mmol/L (ref 96–106)
CO2: 25 mmol/L (ref 20–29)
Calcium: 10.3 mg/dL — ABNORMAL HIGH (ref 8.7–10.2)
Creatinine, Ser: 1.17 mg/dL (ref 0.76–1.27)
GFR calc Af Amer: 80 mL/min/{1.73_m2} (ref >59)
GFR, EST NON AFRICAN AMERICAN: 69 mL/min/{1.73_m2} (ref >59)
Globulin, Total: 2.9 g/dL (ref 1.5–4.5)
Glucose: 102 mg/dL — ABNORMAL HIGH (ref 65–99)
POTASSIUM: 5.2 mmol/L (ref 3.5–5.2)
Sodium: 139 mmol/L (ref 134–144)
Total Protein: 7.9 g/dL (ref 6.0–8.5)

## 2018-05-18 LAB — CBC
Hematocrit: 43.5 % (ref 37.5–51.0)
Hemoglobin: 14.5 g/dL (ref 13.0–17.7)
MCH: 29.9 pg (ref 26.6–33.0)
MCHC: 33.3 g/dL (ref 31.5–35.7)
MCV: 90 fL (ref 79–97)
PLATELETS: 307 10*3/uL (ref 150–450)
RBC: 4.85 x10E6/uL (ref 4.14–5.80)
RDW: 13.9 % (ref 12.3–15.4)
WBC: 7.1 10*3/uL (ref 3.4–10.8)

## 2018-05-19 ENCOUNTER — Telehealth: Payer: Self-pay

## 2018-05-19 NOTE — Telephone Encounter (Signed)
CMA attempt to call patient to inform on lab results.  No answer and left a VM for patient to call back.   If patient call back, please inform:  Labs are essentially normal. There is no anemia based on lab work reviewed today.  Make sure you are drinking at least 48 oz of water per day. Work on eating a low fat, heart healthy diet and participate in regular aerobic exercise program to control as well. Exercise at least 150 minutes per week.

## 2018-05-19 NOTE — Telephone Encounter (Signed)
-----   Message from Claiborne RiggZelda W Fleming, NP sent at 05/18/2018  1:41 PM EDT ----- Labs are essentially normal. There is no anemia based on lab work reviewed today.  Make sure you are drinking at least 48 oz of water per day. Work on eating a low fat, heart healthy diet and participate in regular aerobic exercise program to control as well. Exercise at least 150 minutes per week.

## 2018-06-09 ENCOUNTER — Encounter: Payer: Self-pay | Admitting: Nurse Practitioner

## 2018-07-06 ENCOUNTER — Ambulatory Visit: Payer: Self-pay | Attending: Nurse Practitioner | Admitting: Nurse Practitioner

## 2018-07-06 ENCOUNTER — Encounter: Payer: Self-pay | Admitting: Nurse Practitioner

## 2018-07-06 VITALS — BP 177/84 | HR 58 | Temp 98.4°F | Ht 67.0 in | Wt 134.4 lb

## 2018-07-06 DIAGNOSIS — Z79899 Other long term (current) drug therapy: Secondary | ICD-10-CM | POA: Insufficient documentation

## 2018-07-06 DIAGNOSIS — F17200 Nicotine dependence, unspecified, uncomplicated: Secondary | ICD-10-CM

## 2018-07-06 DIAGNOSIS — F17210 Nicotine dependence, cigarettes, uncomplicated: Secondary | ICD-10-CM | POA: Insufficient documentation

## 2018-07-06 DIAGNOSIS — I10 Essential (primary) hypertension: Secondary | ICD-10-CM | POA: Insufficient documentation

## 2018-07-06 DIAGNOSIS — Z8249 Family history of ischemic heart disease and other diseases of the circulatory system: Secondary | ICD-10-CM | POA: Insufficient documentation

## 2018-07-06 MED ORDER — LOSARTAN POTASSIUM 100 MG PO TABS: 100.0000 mg | ORAL_TABLET | Freq: Every day | ORAL | 0 refills | Status: DC

## 2018-07-06 MED ORDER — AMLODIPINE BESYLATE 10 MG PO TABS: 10.0000 mg | ORAL_TABLET | Freq: Every day | ORAL | 0 refills | Status: DC

## 2018-07-06 MED FILL — AMLODIPINE BESYLATE 10 MG T: 10 | 30 days supply | Qty: 30 | Fill #0

## 2018-07-06 MED FILL — LOSARTAN POTASSIUM 100 MG T: 100 | 30 days supply | Qty: 30 | Fill #0

## 2018-07-06 NOTE — Patient Instructions (Signed)
DASH Eating Plan DASH stands for "Dietary Approaches to Stop Hypertension." The DASH eating plan is a healthy eating plan that has been shown to reduce high blood pressure (hypertension). It may also reduce your risk for type 2 diabetes, heart disease, and stroke. The DASH eating plan may also help with weight loss. What are tips for following this plan? General guidelines  Avoid eating more than 2,300 mg (milligrams) of salt (sodium) a day. If you have hypertension, you may need to reduce your sodium intake to 1,500 mg a day.  Limit alcohol intake to no more than 1 drink a day for nonpregnant women and 2 drinks a day for men. One drink equals 12 oz of beer, 5 oz of wine, or 1 oz of hard liquor.  Work with your health care provider to maintain a healthy body weight or to lose weight. Ask what an ideal weight is for you.  Get at least 30 minutes of exercise that causes your heart to beat faster (aerobic exercise) most days of the week. Activities may include walking, swimming, or biking.  Work with your health care provider or diet and nutrition specialist (dietitian) to adjust your eating plan to your individual calorie needs. Reading food labels  Check food labels for the amount of sodium per serving. Choose foods with less than 5 percent of the Daily Value of sodium. Generally, foods with less than 300 mg of sodium per serving fit into this eating plan.  To find whole grains, look for the word "whole" as the first word in the ingredient list. Shopping  Buy products labeled as "low-sodium" or "no salt added."  Buy fresh foods. Avoid canned foods and premade or frozen meals. Cooking  Avoid adding salt when cooking. Use salt-free seasonings or herbs instead of table salt or sea salt. Check with your health care provider or pharmacist before using salt substitutes.  Do not fry foods. Cook foods using healthy methods such as baking, boiling, grilling, and broiling instead.  Cook with  heart-healthy oils, such as olive, canola, soybean, or sunflower oil. Meal planning   Eat a balanced diet that includes: ? 5 or more servings of fruits and vegetables each day. At each meal, try to fill half of your plate with fruits and vegetables. ? Up to 6-8 servings of whole grains each day. ? Less than 6 oz of lean meat, poultry, or fish each day. A 3-oz serving of meat is about the same size as a deck of cards. One egg equals 1 oz. ? 2 servings of low-fat dairy each day. ? A serving of nuts, seeds, or beans 5 times each week. ? Heart-healthy fats. Healthy fats called Omega-3 fatty acids are found in foods such as flaxseeds and coldwater fish, like sardines, salmon, and mackerel.  Limit how much you eat of the following: ? Canned or prepackaged foods. ? Food that is high in trans fat, such as fried foods. ? Food that is high in saturated fat, such as fatty meat. ? Sweets, desserts, sugary drinks, and other foods with added sugar. ? Full-fat dairy products.  Do not salt foods before eating.  Try to eat at least 2 vegetarian meals each week.  Eat more home-cooked food and less restaurant, buffet, and fast food.  When eating at a restaurant, ask that your food be prepared with less salt or no salt, if possible. What foods are recommended? The items listed may not be a complete list. Talk with your dietitian about what   dietary choices are best for you. Grains Whole-grain or whole-wheat bread. Whole-grain or whole-wheat pasta. Brown rice. Oatmeal. Quinoa. Bulgur. Whole-grain and low-sodium cereals. Pita bread. Low-fat, low-sodium crackers. Whole-wheat flour tortillas. Vegetables Fresh or frozen vegetables (raw, steamed, roasted, or grilled). Low-sodium or reduced-sodium tomato and vegetable juice. Low-sodium or reduced-sodium tomato sauce and tomato paste. Low-sodium or reduced-sodium canned vegetables. Fruits All fresh, dried, or frozen fruit. Canned fruit in natural juice (without  added sugar). Meat and other protein foods Skinless chicken or turkey. Ground chicken or turkey. Pork with fat trimmed off. Fish and seafood. Egg whites. Dried beans, peas, or lentils. Unsalted nuts, nut butters, and seeds. Unsalted canned beans. Lean cuts of beef with fat trimmed off. Low-sodium, lean deli meat. Dairy Low-fat (1%) or fat-free (skim) milk. Fat-free, low-fat, or reduced-fat cheeses. Nonfat, low-sodium ricotta or cottage cheese. Low-fat or nonfat yogurt. Low-fat, low-sodium cheese. Fats and oils Soft margarine without trans fats. Vegetable oil. Low-fat, reduced-fat, or light mayonnaise and salad dressings (reduced-sodium). Canola, safflower, olive, soybean, and sunflower oils. Avocado. Seasoning and other foods Herbs. Spices. Seasoning mixes without salt. Unsalted popcorn and pretzels. Fat-free sweets. What foods are not recommended? The items listed may not be a complete list. Talk with your dietitian about what dietary choices are best for you. Grains Baked goods made with fat, such as croissants, muffins, or some breads. Dry pasta or rice meal packs. Vegetables Creamed or fried vegetables. Vegetables in a cheese sauce. Regular canned vegetables (not low-sodium or reduced-sodium). Regular canned tomato sauce and paste (not low-sodium or reduced-sodium). Regular tomato and vegetable juice (not low-sodium or reduced-sodium). Pickles. Olives. Fruits Canned fruit in a light or heavy syrup. Fried fruit. Fruit in cream or butter sauce. Meat and other protein foods Fatty cuts of meat. Ribs. Fried meat. Bacon. Sausage. Bologna and other processed lunch meats. Salami. Fatback. Hotdogs. Bratwurst. Salted nuts and seeds. Canned beans with added salt. Canned or smoked fish. Whole eggs or egg yolks. Chicken or turkey with skin. Dairy Whole or 2% milk, cream, and half-and-half. Whole or full-fat cream cheese. Whole-fat or sweetened yogurt. Full-fat cheese. Nondairy creamers. Whipped toppings.  Processed cheese and cheese spreads. Fats and oils Butter. Stick margarine. Lard. Shortening. Ghee. Bacon fat. Tropical oils, such as coconut, palm kernel, or palm oil. Seasoning and other foods Salted popcorn and pretzels. Onion salt, garlic salt, seasoned salt, table salt, and sea salt. Worcestershire sauce. Tartar sauce. Barbecue sauce. Teriyaki sauce. Soy sauce, including reduced-sodium. Steak sauce. Canned and packaged gravies. Fish sauce. Oyster sauce. Cocktail sauce. Horseradish that you find on the shelf. Ketchup. Mustard. Meat flavorings and tenderizers. Bouillon cubes. Hot sauce and Tabasco sauce. Premade or packaged marinades. Premade or packaged taco seasonings. Relishes. Regular salad dressings. Where to find more information:  National Heart, Lung, and Blood Institute: www.nhlbi.nih.gov  American Heart Association: www.heart.org Summary  The DASH eating plan is a healthy eating plan that has been shown to reduce high blood pressure (hypertension). It may also reduce your risk for type 2 diabetes, heart disease, and stroke.  With the DASH eating plan, you should limit salt (sodium) intake to 2,300 mg a day. If you have hypertension, you may need to reduce your sodium intake to 1,500 mg a day.  When on the DASH eating plan, aim to eat more fresh fruits and vegetables, whole grains, lean proteins, low-fat dairy, and heart-healthy fats.  Work with your health care provider or diet and nutrition specialist (dietitian) to adjust your eating plan to your individual   calorie needs. This information is not intended to replace advice given to you by your health care provider. Make sure you discuss any questions you have with your health care provider. Document Released: 11/13/2011 Document Revised: 11/17/2016 Document Reviewed: 11/17/2016 Elsevier Interactive Patient Education  2018 [employer]  

## 2018-07-06 NOTE — Progress Notes (Signed)
Assessment & Plan:  Ruben Fuentes was seen today for blood pressure check.  Diagnoses and all orders for this visit:  Essential hypertension -     amLODipine (NORVASC) 10 MG tablet; Take 1 tablet (10 mg total) by mouth daily. -     losartan (COZAAR) 100 MG tablet; Take 1 tablet (100 mg total) by mouth daily. -     CMP14+EGFR Continue all antihypertensives as prescribed.  Remember to bring in your blood pressure log with you for your follow up appointment.  DASH/Mediterranean Diets are healthier choices for HTN.  F/U 3 weeks BP recheck  Tobacco Dependence Ruben Fuentes was counseled on the dangers of tobacco use, and was advised to quit. Reviewed strategies to maximize success, including removing cigarettes and smoking materials from environment, stress management and support of family/friends as well as pharmacological alternatives including: Wellbutrin, Chantix, Nicotine patch, Nicotine gum or lozenges. Smoking cessation support: smoking cessation hotline: 1-800-QUIT-NOW.  Smoking cessation classes are also available through [facility]. Call 701-363-1676 or visit our website at [email or web address]   A total of 3 minutes was spent on counseling for smoking cessation and Ruben Fuentes is not ready to quit.   Patient has been counseled on age-appropriate routine health concerns for screening and prevention. These are reviewed and up-to-date. Referrals have been placed accordingly. Immunizations are up-to-date or declined.    Subjective:   Chief Complaint  Patient presents with  . Blood Pressure Check    Patient is here for blood pressure check.    HPI Ruben Fuentes 57 y.o. male presents to office today for BP recheck. His last office visit with me on 05-17-2018 was to establish care. At that time his blood pressure was poroly controlled. He has a history of noncomplinace with his medications. At that time I dc'd his HCTZ due to renal function as well as his coreg due to  bradycardia. He was started on amlodipine 98m and Losartan 100 mg. Today he reports he did not pick up his amlodipine and has only been taking losartan.   CHRONIC HYPERTENSION Disease Monitoring  Blood pressure range: He has a blood pressure log from home today. Readings 140-160/80-90s. He has not been taking his amlodipine.  BP Readings from Last 3 Encounters:  07/06/18 (!) 177/84  05/17/18 (!) 188/83  03/09/18 132/70   Chest pain: no   Dyspnea: no   Claudication: no  Medication compliance: no, Did not pick up amlodipine 145mMedication Side Effects  Lightheadedness: no   Urinary frequency: no   Edema: no   Impotence: no  Preventitive Healthcare:  Exercise: no   Diet Pattern: salty foods: liver pudding  Salt Restriction:  no   Review of Systems  Constitutional: Negative for fever, malaise/fatigue and weight loss.  HENT: Negative.  Negative for nosebleeds.   Eyes: Negative.  Negative for blurred vision, double vision and photophobia.  Respiratory: Negative.  Negative for cough and shortness of breath.   Cardiovascular: Negative.  Negative for chest pain, palpitations and leg swelling.  Gastrointestinal: Negative.  Negative for heartburn, nausea and vomiting.  Musculoskeletal: Negative.  Negative for myalgias.  Neurological: Negative.  Negative for dizziness, focal weakness, seizures and headaches.  Psychiatric/Behavioral: Negative.  Negative for suicidal ideas.    Past Medical History:  Diagnosis Date  . Arthritis   . Bursitis   . Hypertension     Past Surgical History:  Procedure Laterality Date  . FRACTURE SURGERY     collar bones  . FRACTURE SURGERY  right ankle    Family History  Problem Relation Age of Onset  . Hypertension Father   . Rheum arthritis Sister   . Diabetes Brother   . Hypertension Mother   . Colon cancer Neg Hx     Social History Reviewed with no changes to be made today.   Outpatient Medications Prior to Visit  Medication Sig  Dispense Refill  . albuterol (PROVENTIL HFA;VENTOLIN HFA) 108 (90 Base) MCG/ACT inhaler Inhale 2 puffs into the lungs every 6 (six) hours as needed for wheezing or shortness of breath. 1 Inhaler 2  . atorvastatin (LIPITOR) 20 MG tablet Take 1 tablet (20 mg total) by mouth daily. 90 tablet 1  . diclofenac sodium (VOLTAREN) 1 % GEL Apply 2 g topically 4 (four) times daily as needed. Apply to affected areas. 100 g 2  . amLODipine (NORVASC) 10 MG tablet Take 1 tablet (10 mg total) by mouth daily. 90 tablet 0  . losartan (COZAAR) 100 MG tablet Take 1 tablet (100 mg total) by mouth daily. 90 tablet 0  . naproxen (NAPROSYN) 500 MG tablet TAKE 1 TABLET BY MOUTH TWICE A DAY WITH MEALS FOR 5 DAYS. THEN ONE TABLET BY MOUTH ONCE DAILY WITH MEALS AS NEEDED. (Patient not taking: Reported on 07/06/2018) 40 tablet 0   No facility-administered medications prior to visit.     No Known Allergies     Objective:    BP (!) 177/84 (BP Location: Right Arm, Patient Position: Sitting, Cuff Size: Normal)   Pulse (!) 58   Temp 98.4 F (36.9 C) (Oral)   Ht '5\' 7"'  (1.702 m)   Wt 134 lb 6.4 oz (61 kg)   SpO2 96%   BMI 21.05 kg/m  Wt Readings from Last 3 Encounters:  07/06/18 134 lb 6.4 oz (61 kg)  05/17/18 125 lb 9.6 oz (57 kg)  03/09/18 136 lb (61.7 kg)    Physical Exam  Constitutional: He is oriented to person, place, and time. He appears well-developed and well-nourished. He is cooperative.  HENT:  Head: Normocephalic and atraumatic.  Eyes: EOM are normal.  Neck: Normal range of motion.  Cardiovascular: Regular rhythm and normal heart sounds. Bradycardia present. Exam reveals no gallop and no friction rub.  No murmur heard. Pulmonary/Chest: Effort normal and breath sounds normal. No tachypnea. No respiratory distress. He has no decreased breath sounds. He has no wheezes. He has no rhonchi. He has no rales. He exhibits no tenderness.  Abdominal: Soft. Bowel sounds are normal.  Musculoskeletal: Normal range  of motion. He exhibits no edema.  Neurological: He is alert and oriented to person, place, and time. Coordination normal.  Skin: Skin is warm and dry.  Psychiatric: He has a normal mood and affect. His behavior is normal. Judgment and thought content normal.  Nursing note and vitals reviewed.        Patient has been counseled extensively about nutrition and exercise as well as the importance of adherence with medications and regular follow-up. The patient was given clear instructions to go to ER or return to medical center if symptoms don't improve, worsen or new problems develop. The patient verbalized understanding.   Follow-up: No follow-ups on file.   Gildardo Pounds, FNP-BC [facility], [place]   07/06/2018, 2:10 PM

## 2018-07-07 LAB — CMP14+EGFR
A/G RATIO: 1.7 (ref 1.2–2.2)
ALT: 22 IU/L (ref 0–44)
AST: 25 IU/L (ref 0–40)
Albumin: 4.5 g/dL (ref 3.5–5.5)
Alkaline Phosphatase: 73 IU/L (ref 39–117)
BILIRUBIN TOTAL: 0.3 mg/dL (ref 0.0–1.2)
BUN / CREAT RATIO: 8 — AB (ref 9–20)
BUN: 9 mg/dL (ref 6–24)
CHLORIDE: 99 mmol/L (ref 96–106)
CO2: 18 mmol/L — ABNORMAL LOW (ref 20–29)
Calcium: 9.4 mg/dL (ref 8.7–10.2)
Creatinine, Ser: 1.08 mg/dL (ref 0.76–1.27)
GFR calc non Af Amer: 76 mL/min/{1.73_m2} (ref >59)
GFR, EST AFRICAN AMERICAN: 88 mL/min/{1.73_m2} (ref >59)
GLUCOSE: 94 mg/dL (ref 65–99)
Globulin, Total: 2.6 g/dL (ref 1.5–4.5)
Potassium: 4.3 mmol/L (ref 3.5–5.2)
Sodium: 140 mmol/L (ref 134–144)
Total Protein: 7.1 g/dL (ref 6.0–8.5)

## 2018-07-08 ENCOUNTER — Telehealth: Payer: Self-pay

## 2018-07-08 MED FILL — !FLOVENT HFA 44 MCG INHALER: 44 MCG | 90 days supply | Qty: 3 | Fill #1

## 2018-07-08 MED FILL — DICLOFENAC SODIUM 1% GEL: 1 | 12 days supply | Qty: 100 | Fill #0

## 2018-07-08 MED FILL — ?ATORVASTATIN 20 MG TABLET: 20 | 30 days supply | Qty: 30 | Fill #0

## 2018-07-08 MED FILL — !VENTOLIN HFA INHALER: 108 (90 BAS | 25 days supply | Qty: 18 | Fill #1

## 2018-07-08 NOTE — Telephone Encounter (Signed)
-----   Message from Claiborne RiggZelda W Fleming, NP sent at 07/07/2018  9:53 PM EDT ----- Kidney function, sodium, potassium and liver function are normal

## 2018-07-08 NOTE — Telephone Encounter (Signed)
CMA attempt to call patient to inform on lab results.  No answer and left a VM for patient.  If patient call back, please inform:  Kidney function, sodium, potassium and liver function are normal

## 2018-07-13 MED FILL — NAPROXEN 500 MG TABLET: 500 | 4 days supply | Qty: 5 | Fill #1

## 2018-07-13 MED FILL — ?ATORVASTATIN 20 MG TABLET: 20 | 30 days supply | Qty: 30 | Fill #3

## 2018-07-20 ENCOUNTER — Other Ambulatory Visit: Payer: Self-pay

## 2018-08-05 ENCOUNTER — Other Ambulatory Visit: Payer: Self-pay | Admitting: Nurse Practitioner

## 2018-08-05 DIAGNOSIS — Z79899 Other long term (current) drug therapy: Secondary | ICD-10-CM

## 2018-08-06 ENCOUNTER — Telehealth: Payer: Self-pay | Admitting: Nurse Practitioner

## 2018-08-06 ENCOUNTER — Ambulatory Visit: Payer: Self-pay

## 2018-08-06 ENCOUNTER — Encounter: Payer: Self-pay | Admitting: Pharmacist

## 2018-08-06 ENCOUNTER — Ambulatory Visit: Payer: Self-pay | Attending: Family Medicine | Admitting: Pharmacist

## 2018-08-06 VITALS — BP 176/83 | HR 62

## 2018-08-06 DIAGNOSIS — Z79899 Other long term (current) drug therapy: Secondary | ICD-10-CM

## 2018-08-06 DIAGNOSIS — I10 Essential (primary) hypertension: Secondary | ICD-10-CM | POA: Insufficient documentation

## 2018-08-06 MED FILL — AMLODIPINE BESYLATE 10 MG T: 10 | 30 days supply | Qty: 30 | Fill #1

## 2018-08-06 MED FILL — LOSARTAN POTASSIUM 100 MG T: 100 | 30 days supply | Qty: 30 | Fill #1

## 2018-08-06 NOTE — Progress Notes (Signed)
   S:   PCP: [name]  Patient arrives in good spirits. Presents to the clinic for hypertension evaluation, counseling, and management. Ruben DenverPatient was referred by Zelda on 07/06/18. Noted to have history of non-compliance.  Today, pt denies CP, SOB, HA, or blurred vision.   Denies adherence with medications. "I haven't taken anything in the past couple days because I didn't want to take pills".  Current BP Medications include:   -amlodipine 10 mg daily -losartan 100 mg daily  Dietary habits include:  - Does not limit salt - Consumes tea throughout the day Exercise habits include: - Does not exercise Family / Social history: - FH: HTN (father, mother), DM (brother) - Tobacco: reports smoking a couple of cigarettes/day - Alcohol: 4 beers a day  Home BP readings:  - wife measures BP at home  - 14-day range: SBP 151 - 191; DBP 60 - 112  O:  BP - 176/83 in L arm after 5 minutes rest; HR 62 Last 3 Office BP readings: BP Readings from Last 3 Encounters:  07/06/18 (!) 177/84  05/17/18 (!) 188/83  03/09/18 132/70   BMET    Component Value Date/Time   NA 140 07/06/2018 1423   K 4.3 07/06/2018 1423   CL 99 07/06/2018 1423   CO2 18 (L) 07/06/2018 1423   GLUCOSE 94 07/06/2018 1423   GLUCOSE 77 11/04/2016 1230   BUN 9 07/06/2018 1423   CREATININE 1.08 07/06/2018 1423   CREATININE 1.10 11/04/2016 1230   CALCIUM 9.4 07/06/2018 1423   GFRNONAA 76 07/06/2018 1423   GFRAA 88 07/06/2018 1423    Renal function: CrCl cannot be calculated (Patient's most recent lab result is older than the maximum 21 days allowed.).  A/P: Hypertension longstanding currently uncontrolled on current medications. BP Goal <130/80 mmHg. Patient is not adherent with medications. Explained importance of controlling BP to patient. Emphasized importance of taking BP medications as prescribed. Since he has a history of non-compliance, will continue current medications for now and have him f/u with me in 2  weeks for re-check.   -Continued anti-hypertensives.  -Counseled on lifestyle modifications for blood pressure control including reduced dietary sodium, increased exercise, adequate sleep  Results reviewed and written information provided.   Total time in face-to-face counseling 15 minutes.   F/U Clinic Visit in 2 weeks.    Patient seen with: Mosie LukesMichele Muir, PharmD Candidate [facility] [employer] 2021  Butch PennyLuke Van Ausdall, PharmD, CPP Clinical Pharmacist [facility] [phone]

## 2018-08-06 NOTE — Patient Instructions (Signed)
Thank you for coming to see us today.   Blood pressure today is elevated  Take losartan 100 mg in the morning. Take amlodipine 10 mg before bed.   Limiting salt and caffeine, as well as exercising as able for at least 30 minutes for 5 days out of the week, can also help you lower your blood pressure.  Take your blood pressure at home if you are able. Please write down these numbers and bring them to your visits.  If you have any questions about medications, please call me [phone].  Franky MachoLuke

## 2018-08-06 NOTE — Progress Notes (Signed)
Patient here for lab visit only 

## 2018-08-07 LAB — CMP14+EGFR
A/G RATIO: 1.6 (ref 1.2–2.2)
ALK PHOS: 82 IU/L (ref 39–117)
ALT: 43 IU/L (ref 0–44)
AST: 45 IU/L — AB (ref 0–40)
Albumin: 4.6 g/dL (ref 3.5–5.5)
BUN/Creatinine Ratio: 9 (ref 9–20)
BUN: 10 mg/dL (ref 6–24)
Bilirubin Total: 0.2 mg/dL (ref 0.0–1.2)
CALCIUM: 9.8 mg/dL (ref 8.7–10.2)
CO2: 25 mmol/L (ref 20–29)
Chloride: 98 mmol/L (ref 96–106)
Creatinine, Ser: 1.07 mg/dL (ref 0.76–1.27)
GFR calc Af Amer: 89 mL/min/{1.73_m2} (ref >59)
GFR, EST NON AFRICAN AMERICAN: 77 mL/min/{1.73_m2} (ref >59)
Globulin, Total: 2.9 g/dL (ref 1.5–4.5)
Glucose: 108 mg/dL — ABNORMAL HIGH (ref 65–99)
Potassium: 4.3 mmol/L (ref 3.5–5.2)
Sodium: 139 mmol/L (ref 134–144)
Total Protein: 7.5 g/dL (ref 6.0–8.5)

## 2018-08-12 ENCOUNTER — Telehealth: Payer: Self-pay

## 2018-08-12 NOTE — Telephone Encounter (Signed)
-----   Message from Claiborne Rigg, NP sent at 08/12/2018  1:59 AM EDT ----- Labs are essentially normal. There are some minor variations in your blood work that do not require any additional work up at this time. Will continue to monitor. Make sure you are drinking at least 48 oz of water per day. Work on eating a low fat, heart healthy diet and participate in regular aerobic exercise program to control as well. Exercise at least  30 minutes per day-5 days per week. Avoid red meat. No fried foods. No junk foods, sodas, sugary foods or drinks, unhealthy snacking, alcohol or smoking.

## 2018-08-12 NOTE — Telephone Encounter (Signed)
CMA spoke to patient to inform on lab results.  Patient verified DOB. Patient understood.  

## 2018-08-20 ENCOUNTER — Ambulatory Visit: Payer: Self-pay | Attending: Family Medicine | Admitting: Pharmacist

## 2018-08-20 ENCOUNTER — Encounter: Payer: Self-pay | Admitting: Pharmacist

## 2018-08-20 VITALS — BP 107/65 | HR 81

## 2018-08-20 DIAGNOSIS — Z9119 Patient's noncompliance with other medical treatment and regimen: Secondary | ICD-10-CM | POA: Insufficient documentation

## 2018-08-20 DIAGNOSIS — Z79899 Other long term (current) drug therapy: Secondary | ICD-10-CM | POA: Insufficient documentation

## 2018-08-20 DIAGNOSIS — Z8249 Family history of ischemic heart disease and other diseases of the circulatory system: Secondary | ICD-10-CM | POA: Insufficient documentation

## 2018-08-20 DIAGNOSIS — F17210 Nicotine dependence, cigarettes, uncomplicated: Secondary | ICD-10-CM | POA: Insufficient documentation

## 2018-08-20 DIAGNOSIS — I10 Essential (primary) hypertension: Secondary | ICD-10-CM | POA: Insufficient documentation

## 2018-08-20 DIAGNOSIS — H538 Other visual disturbances: Secondary | ICD-10-CM | POA: Insufficient documentation

## 2018-08-20 NOTE — Patient Instructions (Signed)
Thank you for coming to see us today.   Blood pressure today is very good! Much better!  Medications for blood pressure:  Losartan 100 mg in the morning  Amlodipine 10 mg in the evening before bed  Limiting salt and caffeine, as well as exercising as able for at least 30 minutes for 5 days out of the week, can also help you lower your blood pressure.  Take your blood pressure at home if you are able. Please write down these numbers and bring them to your visits.  If you have any questions about medications, please call me [phone].  Ruben Fuentes

## 2018-08-20 NOTE — Progress Notes (Signed)
   S:   PCP: Bertram DenverZelda Fleming  Patient arrives in good spirits. Presents to the clinic for hypertension evaluation, counseling, and management. Patient was last seen by us on 08/06/18. Noted to have history of non-compliance.  Today, pt reports "a little blurred vision" and denies CP, SOB, HA.   Reports adherence with medications.  Current BP Medications include:   -amlodipine 10 mg daily -losartan 100 mg daily  Dietary habits include:  - Does limit salt - Consumes tea throughout the day but has limited intake Exercise habits include: - walks > 30 minutes daily Family / Social history: - FH: HTN (father, mother), DM (brother) - Tobacco: reports smoking a couple of cigarettes/day - Alcohol: 3 beers a day  Home BP readings:  - wife measures BP at home  - Patient reports systolic BPs in the 170s  O:  BP L arm after 5 minutes rest 107/65; HR 81 Retake: in R arm: 121/66, HR 71 Last 3 Office BP readings: BP Readings from Last 3 Encounters:  08/06/18 (!) 176/83  07/06/18 (!) 177/84  05/17/18 (!) 188/83   BMET    Component Value Date/Time   NA 139 08/06/2018 0928   K 4.3 08/06/2018 0928   CL 98 08/06/2018 0928   CO2 25 08/06/2018 0928   GLUCOSE 108 (H) 08/06/2018 0928   GLUCOSE 77 11/04/2016 1230   BUN 10 08/06/2018 0928   CREATININE 1.07 08/06/2018 0928   CREATININE 1.10 11/04/2016 1230   CALCIUM 9.8 08/06/2018 0928   GFRNONAA 77 08/06/2018 0928   GFRAA 89 08/06/2018 0928    Renal function: CrCl cannot be calculated (Unknown ideal weight.).  A/P: Hypertension longstanding currently controlled on medications. BP Goal <130/80 mmHg. Patient is adherent with medications.   Of note, pt reported home BP of 170/83 this morning. I repeated his BP today and both values are at goal. I have instructed him to bring in home BP meter for troubleshooting next week.  -Continued anti-hypertensives.  -Counseled on lifestyle modifications for blood pressure control including reduced  dietary sodium, increased exercise, adequate sleep  Results reviewed and written information provided.   Total time in face-to-face counseling 15 minutes.   F/U Clinic Visit in 1 week    Patient seen with: Mosie LukesMichele Muir, PharmD Candidate [facility] [employer] of 2021  Butch PennyLuke Van Ausdall, PharmD, CPP Clinical Pharmacist [facility] [phone]

## 2018-08-27 ENCOUNTER — Ambulatory Visit: Payer: Self-pay | Attending: Family Medicine | Admitting: Pharmacist

## 2018-08-27 ENCOUNTER — Encounter: Payer: Self-pay | Admitting: Pharmacist

## 2018-08-27 VITALS — BP 138/78 | HR 76

## 2018-08-27 DIAGNOSIS — F17210 Nicotine dependence, cigarettes, uncomplicated: Secondary | ICD-10-CM | POA: Insufficient documentation

## 2018-08-27 DIAGNOSIS — I10 Essential (primary) hypertension: Secondary | ICD-10-CM | POA: Insufficient documentation

## 2018-08-27 NOTE — Patient Instructions (Signed)
Thank you for coming to see us today.   Blood pressure today is improving  Continue taking blood pressure medications as prescribed.   Limiting salt and caffeine, as well as exercising as able for at least 30 minutes for 5 days out of the week, can also help you lower your blood pressure.  Take your blood pressure at home if you are able. Please write down these numbers and bring them to your visits.  Make a follow-up appointment when you check out with Zelda as soon as possible.  If you have any questions about medications, please call me [phone].  Franky MachoLuke

## 2018-08-27 NOTE — Progress Notes (Signed)
   S:   PCP: Bertram DenverZelda Fleming  Patient arrives in good spirits. Presents to the clinic for hypertension management. Patient was last seen by us on 08/20/18. BP was at goal at that visit, however, he reported much higher pressures at home. He presents with his BP cuff today for troubleshooting.    Today, pt denies CP, SOB, HA or blurred vision.   Reports adherence with medications. Has taken losartan this morning.   Current BP Medications include:   -amlodipine 10 mg daily -losartan 100 mg daily  Dietary habits include:  - Reports limiting salt - Consumes tea throughout the day  Exercise habits include: - walks > 30 minutes daily Family / Social history: - FH: HTN (father, mother), DM (brother) - Tobacco: reports smoking a couple of cigarettes/day - Alcohol: 3 beers a day  Home BP readings:  - wife measures BP at home  - Patient reports systolic BPs in the 140s-150s  O:  BP L arm after 5 minutes rest: 138/78, HR 76 Home meter: 135/73 in same arm  Last 3 Office BP readings: BP Readings from Last 3 Encounters:  08/20/18 107/65  08/06/18 (!) 176/83  07/06/18 (!) 177/84   BMET    Component Value Date/Time   NA 139 08/06/2018 0928   K 4.3 08/06/2018 0928   CL 98 08/06/2018 0928   CO2 25 08/06/2018 0928   GLUCOSE 108 (H) 08/06/2018 0928   GLUCOSE 77 11/04/2016 1230   BUN 10 08/06/2018 0928   CREATININE 1.07 08/06/2018 0928   CREATININE 1.10 11/04/2016 1230   CALCIUM 9.8 08/06/2018 0928   GFRNONAA 77 08/06/2018 0928   GFRAA 89 08/06/2018 0928    Renal function: CrCl cannot be calculated (Unknown ideal weight.).  A/P: Hypertension longstanding. BP Goal <130/80 mmHg. He is slightly above goal today. Patient is adherent with medications.   Validated his meter. Value obtained with home BP cuff close to value obtained in clinic. I believe some of his BP elevations at home may be due to poor technique when measuring, lack of rest before measuring, or secondary to  pain/discomfort. He continues to report muscle aches. Reviewed his medication list. Nothing from a side-effect point of view. Will have him follow-up with PCP for management.   -Continued anti-hypertensives.  -Counseled on lifestyle modifications for blood pressure control including reduced dietary sodium, increased exercise, adequate sleep  Results reviewed and written information provided.   Total time in face-to-face counseling 15 minutes.   F/U with PCP 09/15/18.  Butch PennyLuke Van Ausdall, PharmD, CPP Clinical Pharmacist [facility] [phone]

## 2018-09-15 ENCOUNTER — Ambulatory Visit: Payer: Self-pay | Admitting: Nurse Practitioner

## 2018-09-28 ENCOUNTER — Encounter: Payer: Self-pay | Admitting: Nurse Practitioner

## 2018-09-28 ENCOUNTER — Ambulatory Visit: Payer: Self-pay | Attending: Nurse Practitioner | Admitting: Nurse Practitioner

## 2018-09-28 VITALS — BP 166/77 | HR 66 | Temp 99.3°F | Ht 67.0 in | Wt 132.4 lb

## 2018-09-28 DIAGNOSIS — R52 Pain, unspecified: Secondary | ICD-10-CM

## 2018-09-28 DIAGNOSIS — Z79899 Other long term (current) drug therapy: Secondary | ICD-10-CM | POA: Insufficient documentation

## 2018-09-28 DIAGNOSIS — E782 Mixed hyperlipidemia: Secondary | ICD-10-CM | POA: Insufficient documentation

## 2018-09-28 DIAGNOSIS — G894 Chronic pain syndrome: Secondary | ICD-10-CM | POA: Insufficient documentation

## 2018-09-28 DIAGNOSIS — I10 Essential (primary) hypertension: Secondary | ICD-10-CM | POA: Insufficient documentation

## 2018-09-28 DIAGNOSIS — M1990 Unspecified osteoarthritis, unspecified site: Secondary | ICD-10-CM | POA: Insufficient documentation

## 2018-09-28 MED ORDER — DICLOFENAC SODIUM 1 % TD GEL: 2.0000 g | Freq: Four times a day (QID) | TRANSDERMAL | 2 refills | Status: DC | PRN

## 2018-09-28 MED ORDER — LOSARTAN POTASSIUM 100 MG PO TABS: 100.0000 mg | ORAL_TABLET | Freq: Every day | ORAL | 0 refills | Status: DC

## 2018-09-28 MED ORDER — TRAMADOL HCL 50 MG PO TABS: 50.0000 mg | ORAL_TABLET | Freq: Three times a day (TID) | ORAL | 0 refills | Status: AC | PRN

## 2018-09-28 MED ORDER — ATORVASTATIN CALCIUM 20 MG PO TABS: 20.0000 mg | ORAL_TABLET | Freq: Every day | ORAL | 1 refills | Status: DC

## 2018-09-28 MED FILL — traMADol HCL 50 MG TABS: 50 | 20 days supply | Qty: 60 | Fill #0

## 2018-09-28 MED FILL — ?ATORVASTATIN 20 MG TABLET: 20 | 30 days supply | Qty: 30 | Fill #0

## 2018-09-28 MED FILL — DICLOFENAC SODIUM 1% GEL: 1 | 12 days supply | Qty: 100 | Fill #0

## 2018-09-28 MED FILL — LOSARTAN POTASSIUM 100 MG T: 100 | 30 days supply | Qty: 30 | Fill #0

## 2018-09-28 NOTE — Progress Notes (Signed)
Assessment & Plan:  Ruben Fuentes was seen today for generalized body aches.  Diagnoses and all orders for this visit:  Chronic pain syndrome -     traMADol (ULTRAM) 50 MG tablet; Take 1 tablet (50 mg total) by mouth every 8 (eight) hours as needed for moderate pain or severe pain. -     Drug Screen 13 w/Conf, WB  Essential hypertension -     losartan (COZAAR) 100 MG tablet; Take 1 tablet (100 mg total) by mouth daily. Continue all antihypertensives as prescribed.  Remember to bring in your blood pressure log with you for your follow up appointment.  DASH/Mediterranean Diets are healthier choices for HTN.   Encounter for pain management -     Drug Screen 12+Alcohol+CRT, Ur  Chronic arthritis -     diclofenac sodium (VOLTAREN) 1 % GEL; Apply 2 g topically 4 (four) times daily as needed. Apply to affected areas.  Hyperlipidemia, mixed -     atorvastatin (LIPITOR) 20 MG tablet; Take 1 tablet (20 mg total) by mouth daily. INSTRUCTIONS: Work on a low fat, heart healthy diet and participate in regular aerobic exercise program by working out at least 150 minutes per week; 5 days a week-30 minutes per day. Avoid red meat, fried foods. junk foods, sodas, sugary drinks, unhealthy snacking, alcohol and smoking.  Drink at least 48oz of water per day and monitor your carbohydrate intake daily.      Patient has been counseled on age-appropriate routine health concerns for screening and prevention. These are reviewed and up-to-date. Referrals have been placed accordingly. Immunizations are up-to-date or declined.    Subjective:   Chief Complaint  Patient presents with  . Generalized Body Aches    Pt. stated his whole body feels achy.   HPI Ruben Fuentes 56 y.o. male presents to office today for follow up to generalized myalgias and OA. Does not feel pain is adequately controlled. Was denied by pain management. Will try tylenol #3 and have pain contract signed today as well as drug screen.     CHRONIC HYPERTENSION Disease Monitoring  Blood pressure range BP Readings from Last 3 Encounters:  09/28/18 (!) 166/77  08/27/18 138/78  08/20/18 107/65  Blood pressure not well controlled. He is not taking his amlodipine 10mg  as he ran out of his medication.  Was only taking losartan 100 mg daily.   Chest pain: no   Dyspnea: no   Claudication: no  Medication compliance: no  Medication Side Effects  Lightheadedness: no   Urinary frequency: no   Edema: no   Impotence: no  Preventitive Healthcare:  Exercise: no   Diet Pattern: diet: general  Salt Restriction:  no    Hyperlipidemia Patient presents for follow up to hyperlipidemia.  He is not medication compliant taking atorvastatin 20mg  daily. He is not diet compliant and denies poor exercise tolerance, tachypnea and skin xanthelasma or statin intolerance including myalgias.  Lab Results  Component Value Date   CHOL 156 01/11/2018   Lab Results  Component Value Date   HDL 48 01/11/2018   Lab Results  Component Value Date   LDLCALC 88 01/11/2018   Lab Results  Component Value Date   TRIG 99 01/11/2018   Lab Results  Component Value Date   CHOLHDL 3.3 01/11/2018    Review of Systems  Constitutional: Negative for fever, malaise/fatigue and weight loss.  HENT: Negative.  Negative for nosebleeds.   Eyes: Negative.  Negative for blurred vision, double vision and photophobia.  Respiratory: Negative.  Negative for cough and shortness of breath.   Cardiovascular: Negative.  Negative for chest pain, palpitations and leg swelling.  Gastrointestinal: Negative.  Negative for heartburn, nausea and vomiting.  Musculoskeletal: Positive for back pain, joint pain and myalgias.  Neurological: Negative.  Negative for dizziness, focal weakness, seizures and headaches.  Psychiatric/Behavioral: Negative.  Negative for suicidal ideas.    Past Medical History:  Diagnosis Date  . Arthritis   . Bursitis   . Hypertension      Past Surgical History:  Procedure Laterality Date  . FRACTURE SURGERY     collar bones  . FRACTURE SURGERY     right ankle    Family History  Problem Relation Age of Onset  . Hypertension Father   . Rheum arthritis Sister   . Diabetes Brother   . Hypertension Mother   . Colon cancer Neg Hx     Social History Reviewed with no changes to be made today.   Outpatient Medications Prior to Visit  Medication Sig Dispense Refill  . albuterol (PROVENTIL HFA;VENTOLIN HFA) 108 (90 Base) MCG/ACT inhaler Inhale 2 puffs into the lungs every 6 (six) hours as needed for wheezing or shortness of breath. 1 Inhaler 2  . atorvastatin (LIPITOR) 20 MG tablet Take 1 tablet (20 mg total) by mouth daily. 90 tablet 1  . diclofenac sodium (VOLTAREN) 1 % GEL Apply 2 g topically 4 (four) times daily as needed. Apply to affected areas. 100 g 2  . losartan (COZAAR) 100 MG tablet Take 1 tablet (100 mg total) by mouth daily. 90 tablet 0  . amLODipine (NORVASC) 10 MG tablet Take 1 tablet (10 mg total) by mouth daily. (Patient not taking: Reported on 09/28/2018) 90 tablet 0  . naproxen (NAPROSYN) 500 MG tablet TAKE 1 TABLET BY MOUTH TWICE A DAY WITH MEALS FOR 5 DAYS. THEN ONE TABLET BY MOUTH ONCE DAILY WITH MEALS AS NEEDED. (Patient not taking: Reported on 07/06/2018) 40 tablet 0   No facility-administered medications prior to visit.     No Known Allergies     Objective:    BP (!) 166/77 (BP Location: Right Arm, Patient Position: Sitting, Cuff Size: Normal)   Pulse 66   Temp 99.3 F (37.4 C) (Oral)   Ht 5\' 7"  (1.702 m)   Wt 132 lb 6.4 oz (60.1 kg)   SpO2 97%   BMI 20.74 kg/m  Wt Readings from Last 3 Encounters:  09/28/18 132 lb 6.4 oz (60.1 kg)  07/06/18 134 lb 6.4 oz (61 kg)  05/17/18 125 lb 9.6 oz (57 kg)    Physical Exam  Constitutional: He is oriented to person, place, and time. He appears well-developed and well-nourished. He is cooperative.  HENT:  Head: Normocephalic and atraumatic.   Eyes: EOM are normal.  Neck: Normal range of motion.  Cardiovascular: Normal rate, regular rhythm, normal heart sounds and intact distal pulses. Exam reveals no gallop and no friction rub.  No murmur heard. Pulmonary/Chest: Effort normal and breath sounds normal. No tachypnea. No respiratory distress. He has no decreased breath sounds. He has no wheezes. He has no rhonchi. He has no rales. He exhibits no tenderness.  Abdominal: Soft. Bowel sounds are normal.  Musculoskeletal: Normal range of motion. He exhibits no edema or deformity.       Lumbar back: He exhibits pain.  Neurological: He is alert and oriented to person, place, and time. Coordination normal.  Skin: Skin is warm and dry.  Psychiatric: He has a  normal mood and affect. His behavior is normal. Judgment and thought content normal.  Nursing note and vitals reviewed.        Patient has been counseled extensively about nutrition and exercise as well as the importance of adherence with medications and regular follow-up. The patient was given clear instructions to go to ER or return to medical center if symptoms don't improve, worsen or new problems develop. The patient verbalized understanding.   Follow-up: Return in about 6 weeks (around 11/09/2018) for BP recheck.   Claiborne Rigg, FNP-BC [facility] [place], [place] [phone]   10/01/2018, 2:28 PM

## 2018-10-01 ENCOUNTER — Encounter: Payer: Self-pay | Admitting: Nurse Practitioner

## 2018-10-05 LAB — THC,MS,WB/SP RFX
CANNABIDIOL: NEGATIVE ng/mL
CARBOXY-THC: 47.4 ng/mL
Cannabinoid Confirmation: POSITIVE
Cannabinol: NEGATIVE ng/mL
HYDROXY-THC: 1 ng/mL
Tetrahydrocannabinol(THC): 1.5 ng/mL

## 2018-10-05 LAB — DRUG SCREEN 13 W/CONF, WB
Amphetamines, IA: NEGATIVE ng/mL
BARBITURATES, IA: NEGATIVE ug/mL
Benzodiazepines, IA: NEGATIVE ng/mL
COCAINE/METABOLITE,IA: NEGATIVE ng/mL
Fentanyl, IA: NEGATIVE ng/mL
Meperidine, IA: NEGATIVE ng/mL
Methadone, IA: NEGATIVE ng/mL
OXYCODONES, IA: NEGATIVE ng/mL
Opiates, IA: NEGATIVE ng/mL
PHENCYCLIDINE, IA: NEGATIVE ng/mL
Propoxyphene, IA: NEGATIVE ng/mL
THC (Marijuana) Mtb, IA: POSITIVE ng/mL — AB
TRAMADOL, IA: NEGATIVE ng/mL

## 2018-10-11 ENCOUNTER — Telehealth: Payer: Self-pay

## 2018-10-11 NOTE — Telephone Encounter (Signed)
-----   Message from Claiborne Rigg, NP sent at 10/07/2018  9:29 PM EDT ----- Labs positive for THC. Will not be able to refill tramadol.

## 2018-10-11 NOTE — Telephone Encounter (Signed)
CMA spoke to patient to inform on results.  Patient verified DOB. Pt. Understood.  

## 2018-11-05 ENCOUNTER — Ambulatory Visit: Payer: Self-pay | Admitting: Pharmacist

## 2018-11-29 ENCOUNTER — Encounter: Payer: Self-pay | Admitting: Pharmacist

## 2018-11-29 ENCOUNTER — Ambulatory Visit: Payer: Self-pay | Attending: Family Medicine | Admitting: Pharmacist

## 2018-11-29 VITALS — BP 152/73 | HR 56

## 2018-11-29 DIAGNOSIS — Z833 Family history of diabetes mellitus: Secondary | ICD-10-CM | POA: Insufficient documentation

## 2018-11-29 DIAGNOSIS — Z8249 Family history of ischemic heart disease and other diseases of the circulatory system: Secondary | ICD-10-CM | POA: Insufficient documentation

## 2018-11-29 DIAGNOSIS — F17210 Nicotine dependence, cigarettes, uncomplicated: Secondary | ICD-10-CM | POA: Insufficient documentation

## 2018-11-29 DIAGNOSIS — I10 Essential (primary) hypertension: Secondary | ICD-10-CM | POA: Insufficient documentation

## 2018-11-29 MED FILL — LOSARTAN POTASSIUM 100 MG T: 100 | 30 days supply | Qty: 30 | Fill #1

## 2018-11-29 MED FILL — AMLODIPINE BESYLATE 10 MG T: 10 | 30 days supply | Qty: 30 | Fill #2

## 2018-11-29 MED FILL — ?ATORVASTATIN 20 MG TABLET: 20 | 30 days supply | Qty: 30 | Fill #1

## 2018-11-29 NOTE — Progress Notes (Signed)
   S:   PCP: Bertram DenverZelda Fleming  Patient arrives in good spirits. Presents to the clinic for hypertension management. Patient was last seen 09/28/18. Reported non-compliance with amlodipine at that visit as BP was elevated.    Today, pt denies chest pain, shortness of breath, headache or blurred vision.   Denies adherence with medications. "Im out of about everything".    Current BP Medications include:   -amlodipine 10 mg daily -losartan 100 mg daily - Has tried HCTZ with side effects   Dietary habits include:  - Reports limiting salt - Consumes tea throughout the day  Exercise habits include: - walks > 30 minutes daily Family / Social history: - FH: HTN (father, mother), DM (brother) - Tobacco: reports smoking a couple of cigarettes/day - Alcohol: 3 beers a day  Home BP readings:  - wife measures BP at home  - Patient reports systolic BPs in the 140s-150s  O:  BP L arm after 5 minutes rest: 152/73, HR 56 Home meter: 135/73 in same arm  Last 3 Office BP readings: BP Readings from Last 3 Encounters:  09/28/18 (!) 166/77  08/27/18 138/78  08/20/18 107/65   BMET    Component Value Date/Time   NA 139 08/06/2018 0928   K 4.3 08/06/2018 0928   CL 98 08/06/2018 0928   CO2 25 08/06/2018 0928   GLUCOSE 108 (H) 08/06/2018 0928   GLUCOSE 77 11/04/2016 1230   BUN 10 08/06/2018 0928   CREATININE 1.07 08/06/2018 0928   CREATININE 1.10 11/04/2016 1230   CALCIUM 9.8 08/06/2018 0928   GFRNONAA 77 08/06/2018 0928   GFRAA 89 08/06/2018 0928   Renal function: CrCl cannot be calculated (Patient's most recent lab result is older than the maximum 21 days allowed.).  A/P: Hypertension longstanding. BP Goal <130/80 mmHg. Patient is not adherent with medications. Pt with history of non-compliance. He has not been taking his medications regularly. Stressed the importance of adherence; will give refills for amlodipine and losartan.  -Continued anti-hypertensives.  -Counseled on  lifestyle modifications for blood pressure control including reduced dietary sodium, increased exercise, adequate sleep  Results reviewed and written information provided.   Total time in face-to-face counseling 15 minutes.   F/U in 1 month for BP recheck.   Butch PennyLuke Van Ausdall, PharmD, CPP Clinical Pharmacist [facility] [phone]

## 2018-11-29 NOTE — Patient Instructions (Signed)
Thank you for coming to see us today.   Blood pressure today is elevated.  Start taking blood pressure medications as prescribed.   Limiting salt and caffeine, as well as exercising as able for at least 30 minutes for 5 days out of the week, can also help you lower your blood pressure.  Take your blood pressure at home if you are able. Please write down these numbers and bring them to your visits.  If you have any questions about medications, please call me [phone].  Luke.   

## 2018-12-29 NOTE — Progress Notes (Signed)
   S:   PCP: Bertram Denver  Patient arrives in poor spirits. His complaints today includes pain from OA.   Presents to the clinic for hypertension management. Patient was last seen and referred by Zelda on 09/28/18. I saw him on 11/29/18. He reported being without his medication - his BP was elevated. Made no changes to his medication regimen at that visit.    Today, pt denies chest pain, shortness of breath, headache or blurred vision.   Confirms adherence with medications. Requests refills.  Current BP Medications include:   -amlodipine 10 mg daily -losartan 100 mg daily  Dietary habits include:  - Reports limiting salt - Consumes tea throughout the day  Exercise habits include: - walks > 30 minutes daily Family / Social history: - FH: HTN (father, mother), DM (brother) - Tobacco: reports smoking a couple of cigarettes/day - Alcohol: 3 beers/a couple of the week  Home BP readings:  - wife measures BP at home   O:  BP in L arm after 5 minutes rest: 171/73, HR 79  Last 3 Office BP readings: BP Readings from Last 3 Encounters:  12/30/18 (!) 171/73  11/29/18 (!) 152/73  09/28/18 (!) 166/77   BMET    Component Value Date/Time   NA 139 08/06/2018 0928   K 4.3 08/06/2018 0928   CL 98 08/06/2018 0928   CO2 25 08/06/2018 0928   GLUCOSE 108 (H) 08/06/2018 0928   GLUCOSE 77 11/04/2016 1230   BUN 10 08/06/2018 0928   CREATININE 1.07 08/06/2018 0928   CREATININE 1.10 11/04/2016 1230   CALCIUM 9.8 08/06/2018 0928   GFRNONAA 77 08/06/2018 0928   GFRAA 89 08/06/2018 0928   Renal function: CrCl cannot be calculated (Patient's most recent lab result is older than the maximum 21 days allowed.).  A/P: Hypertension longstanding currently uncontrolled. BP Goal <130/80 mmHg. Patient is adherent with medications. He has tried HCTZ in the past but this was discontinued. Reviewed notes from his PCP. His creatinine increased while on the HCTZ. It has since normalized. Will try low  dose chlorthalidone. Pt counseled to stay hydrated. We will check a CMP today and continue to monitor.   -Continue amlodipine, Continue losartan -Add chlorthalidone 12.5 mg daily (1/2 of the 25 mg tablet) -CMP14+GFR, lipid -Counseled on lifestyle modifications for blood pressure control including reduced dietary sodium, increased exercise, adequate sleep -HM: UTD on vaccines  Results reviewed and written information provided. Total time in face-to-face counseling 15 minutes.   F/U in 1 month with PCP.   Patient seen with:  Arletha Pili, PharmD Candidate  [employer]  Class of 2022  Butch Penny, PharmD, CPP Clinical Pharmacist [facility] [phone]

## 2018-12-30 ENCOUNTER — Ambulatory Visit: Payer: Self-pay | Attending: Nurse Practitioner | Admitting: Pharmacist

## 2018-12-30 ENCOUNTER — Encounter: Payer: Self-pay | Admitting: Pharmacist

## 2018-12-30 DIAGNOSIS — Z8249 Family history of ischemic heart disease and other diseases of the circulatory system: Secondary | ICD-10-CM | POA: Insufficient documentation

## 2018-12-30 DIAGNOSIS — M1990 Unspecified osteoarthritis, unspecified site: Secondary | ICD-10-CM | POA: Insufficient documentation

## 2018-12-30 DIAGNOSIS — I10 Essential (primary) hypertension: Secondary | ICD-10-CM | POA: Insufficient documentation

## 2018-12-30 DIAGNOSIS — Z79899 Other long term (current) drug therapy: Secondary | ICD-10-CM | POA: Insufficient documentation

## 2018-12-30 DIAGNOSIS — F17210 Nicotine dependence, cigarettes, uncomplicated: Secondary | ICD-10-CM | POA: Insufficient documentation

## 2018-12-30 MED ORDER — CHLORTHALIDONE 25 MG PO TABS: 12.5000 mg | ORAL_TABLET | Freq: Every day | ORAL | 2 refills | Status: DC

## 2018-12-30 MED ORDER — AMLODIPINE BESYLATE 10 MG PO TABS: 10.0000 mg | ORAL_TABLET | Freq: Every day | ORAL | 2 refills | Status: DC

## 2018-12-30 MED FILL — LOSARTAN POTASSIUM 100 MG T: 100 | 30 days supply | Qty: 30 | Fill #2

## 2018-12-30 MED FILL — ?ATORVASTATIN 20 MG TABLET: 20 | 30 days supply | Qty: 30 | Fill #2

## 2018-12-30 MED FILL — AMLODIPINE BESYLATE 10 MG T: 10 | 30 days supply | Qty: 30 | Fill #0

## 2018-12-30 MED FILL — ?CHLORTHALIDONE 25 MG TABLE: 25 | 30 days supply | Qty: 15 | Fill #0

## 2018-12-30 NOTE — Patient Instructions (Signed)
Thank you for coming to see [name] today.   Blood pressure today is elevated.  Continue amlodipine and losartan.  I want to add chlorthalidone at a low dose. Take 1/2 of a tablet every morning with losartan. Take amlodipine in the evening before bed.  You must drink at least 48 oz of water a day.   OTC items to try for smoking:  1. 2 mg lozenge or gum as needed for cravings Or  2. 7 mg/day nicotine patch.   Limiting salt and caffeine, as well as exercising as able for at least 30 minutes for 5 days out of the week, can also help you lower your blood pressure.  Take your blood pressure at home if you are able. Please write down these numbers and bring them to your visits.  If you have any questions about medications, please call me [phone].  [name]

## 2018-12-31 LAB — LIPID PANEL
Chol/HDL Ratio: 3.1 ratio (ref 0.0–5.0)
Cholesterol, Total: 176 mg/dL (ref 100–199)
HDL: 56 mg/dL (ref >39)
LDL Calculated: 100 mg/dL — ABNORMAL HIGH (ref 0–99)
TRIGLYCERIDES: 99 mg/dL (ref 0–149)
VLDL Cholesterol Cal: 20 mg/dL (ref 5–40)

## 2018-12-31 LAB — CMP14+EGFR
A/G RATIO: 1.5 (ref 1.2–2.2)
ALK PHOS: 89 IU/L (ref 39–117)
ALT: 38 IU/L (ref 0–44)
AST: 47 IU/L — AB (ref 0–40)
Albumin: 4.8 g/dL (ref 3.8–4.9)
BILIRUBIN TOTAL: 0.7 mg/dL (ref 0.0–1.2)
BUN/Creatinine Ratio: 11 (ref 9–20)
BUN: 12 mg/dL (ref 6–24)
CHLORIDE: 95 mmol/L — AB (ref 96–106)
CO2: 22 mmol/L (ref 20–29)
Calcium: 10.1 mg/dL (ref 8.7–10.2)
Creatinine, Ser: 1.05 mg/dL (ref 0.76–1.27)
GFR calc Af Amer: 91 mL/min/{1.73_m2} (ref >59)
GFR calc non Af Amer: 78 mL/min/{1.73_m2} (ref >59)
GLOBULIN, TOTAL: 3.1 g/dL (ref 1.5–4.5)
Glucose: 111 mg/dL — ABNORMAL HIGH (ref 65–99)
POTASSIUM: 4.2 mmol/L (ref 3.5–5.2)
SODIUM: 135 mmol/L (ref 134–144)
Total Protein: 7.9 g/dL (ref 6.0–8.5)

## 2019-01-11 ENCOUNTER — Telehealth: Payer: Self-pay

## 2019-01-11 NOTE — Telephone Encounter (Signed)
-----   Message from Claiborne Rigg, NP sent at 01/10/2019  8:46 PM EST ----- Labs are essentially normal. Avoid red meat/beef, junk foods, fried foods, and unhealthy snacking. Go for a walk at least 3 times per week and drink at least 48 oz of water per day

## 2019-01-11 NOTE — Telephone Encounter (Signed)
CMA left a VM of lab results for patient and to callback if there are any concerns.

## 2019-02-02 ENCOUNTER — Ambulatory Visit: Payer: Self-pay | Attending: Nurse Practitioner | Admitting: Nurse Practitioner

## 2019-02-02 ENCOUNTER — Encounter: Payer: Self-pay | Admitting: Nurse Practitioner

## 2019-02-02 VITALS — BP 154/72 | HR 62 | Temp 99.7°F | Ht 67.0 in | Wt 129.4 lb

## 2019-02-02 DIAGNOSIS — E782 Mixed hyperlipidemia: Secondary | ICD-10-CM

## 2019-02-02 DIAGNOSIS — M2550 Pain in unspecified joint: Secondary | ICD-10-CM

## 2019-02-02 DIAGNOSIS — I10 Essential (primary) hypertension: Secondary | ICD-10-CM

## 2019-02-02 DIAGNOSIS — F17200 Nicotine dependence, unspecified, uncomplicated: Secondary | ICD-10-CM

## 2019-02-02 DIAGNOSIS — J449 Chronic obstructive pulmonary disease, unspecified: Secondary | ICD-10-CM

## 2019-02-02 MED ORDER — ATORVASTATIN CALCIUM 20 MG PO TABS: 20.0000 mg | ORAL_TABLET | Freq: Every day | ORAL | 1 refills | Status: DC

## 2019-02-02 MED ORDER — AMLODIPINE BESYLATE 10 MG PO TABS: 10.0000 mg | ORAL_TABLET | Freq: Every day | ORAL | 2 refills | Status: DC

## 2019-02-02 MED ORDER — CHLORTHALIDONE 25 MG PO TABS: 12.5000 mg | ORAL_TABLET | Freq: Every day | ORAL | 1 refills | Status: DC

## 2019-02-02 MED ORDER — NICOTINE 7 MG/24HR TD PT24: 7.0000 mg | MEDICATED_PATCH | Freq: Every day | TRANSDERMAL | 0 refills | Status: AC

## 2019-02-02 MED ORDER — NICOTINE 14 MG/24HR TD PT24: 14.0000 mg | MEDICATED_PATCH | Freq: Every day | TRANSDERMAL | 0 refills | Status: AC

## 2019-02-02 MED ORDER — CHLORTHALIDONE 25 MG PO TABS: 12.5000 mg | ORAL_TABLET | Freq: Every day | ORAL | 2 refills | Status: DC

## 2019-02-02 MED ORDER — LOSARTAN POTASSIUM 100 MG PO TABS: 100.0000 mg | ORAL_TABLET | Freq: Every day | ORAL | 0 refills | Status: DC

## 2019-02-02 MED FILL — LOSARTAN POTASSIUM 100 MG T: 100 | 30 days supply | Qty: 30 | Fill #0

## 2019-02-02 MED FILL — ?ATORVASTATIN 20 MG TABLET: 20 | 30 days supply | Qty: 30 | Fill #0

## 2019-02-02 MED FILL — NICOTINE 14 MG/24HR PATCH: 14 | 28 days supply | Qty: 28 | Fill #0

## 2019-02-02 MED FILL — NICOTINE 7 MG/24HR PATCH: 7 | 28 days supply | Qty: 28 | Fill #0

## 2019-02-02 MED FILL — AMLODIPINE BESYLATE 10 MG T: 10 | 30 days supply | Qty: 30 | Fill #0

## 2019-02-02 MED FILL — ?CHLORTHALIDONE 25 MG TABLE: 25 | 30 days supply | Qty: 15 | Fill #0

## 2019-02-02 NOTE — Progress Notes (Signed)
f  Assessment & Plan:  Ruben Fuentes was seen today for follow-up.  Diagnoses and all orders for this visit:  Essential hypertension -     amLODipine (NORVASC) 10 MG tablet; Take 1 tablet (10 mg total) by mouth daily. -     losartan (COZAAR) 100 MG tablet; Take 1 tablet (100 mg total) by mouth daily. -     chlorthalidone (HYGROTON) 25 MG tablet; Take 0.5 tablets (12.5 mg total) by mouth daily. Continue all antihypertensives as prescribed.  Remember to bring in your blood pressure log with you for your follow up appointment.  DASH/Mediterranean Diets are healthier choices for HTN.   Hyperlipidemia, mixed -     atorvastatin (LIPITOR) 20 MG tablet; Take 1 tablet (20 mg total) by mouth daily. INSTRUCTIONS: Work on a low fat, heart healthy diet and participate in regular aerobic exercise program by working out at least 150 minutes per week; 5 days a week-30 minutes per day. Avoid red meat, fried foods. junk foods, sodas, sugary drinks, unhealthy snacking, alcohol and smoking.  Drink at least 48oz of water per day and monitor your carbohydrate intake daily.    Tobacco dependence 1. Ruben Fuentes continues to smoke 1/4 pack of cigarettes per day. 2. Ruben Fuentes was counseled on the dangers of tobacco use, and was advised to quit. We reviewed specific strategies to maximize success, including removing cigarettes and smoking materials from environment, stress management and support of family/friends as well as pharmacological alternatives. 3. A total of 4 minutes was spent on counseling for smoking cessation and Ruben Fuentes is ready to quit and has chosen Nicotine patches to start today.  4. Ruben Fuentes was offered Wellbutrin, Chantix, Nicotine patch, Nicotine gum or lozenges.  Due to out of pocket costs Ruben Fuentes was also given smoking cessation support and advised to contact: the Smoking Cessation hotline: 1-800-QUIT-NOW.  Ruben Fuentes was also informed of our Smoking cessation classes which are also available through [facility] and  [facility] by calling [phone] or visit our website at [email or web address].  5. Will follow up at next scheduled office visit.   -     nicotine (NICODERM CQ - DOSED IN MG/24 HOURS) 14 mg/24hr patch; Place 1 patch (14 mg total) onto the skin daily. -     nicotine (NICODERM CQ - DOSED IN MG/24 HR) 7 mg/24hr patch; Place 1 patch (7 mg total) onto the skin daily for 28 days.  COPD mixed type (HCC) Chronic and controlled.  Continue albuterol inhaler prn  Arthralgia of multiple joints -     Ambulatory referral to Pain Clinic   Patient has been counseled on age-appropriate routine health concerns for screening and prevention. These are reviewed and up-to-date. Referrals have been placed accordingly. Immunizations are up-to-date or declined.    Subjective:   Chief Complaint  Patient presents with  . Follow-up    Pt. is here for hypertension follow-up. Pt. stated he need medication refill, been out about two days.    HPI Ruben Fuentes 58 y.o. male presents to office today for follow up to HTN. He endorses bilateral shoulder pain which is chronic in addition to his generalized arthralgias. Has been taking OTC analgesics with little relief of pain. Had been prescribed tramadol in the past however UDS was THC+.  Essential Hypertension He has been monitoring his blood pressure at home with reported SBP 130s. Endorses medication compliance however ran out of BP meds a few days ago.  Current medications include amlodipine 10mg , chlorthalidone 12.5 mg daily, losartan 100  mg daily. Denies chest pain, shortness of breath or BLE edema.  BP Readings from Last 3 Encounters:  02/02/19 (!) 154/72  12/30/18 (!) 171/73  11/29/18 (!) 152/73    Hyperlipidemia Patient presents for follow up to hyperlipidemia.  He is medication compliant. He is not consistently diet compliant and denies chest pain or statin intolerance including myalgias.  Lab Results  Component Value Date   CHOL 176 12/30/2018    Lab Results  Component Value Date   HDL 56 12/30/2018   Lab Results  Component Value Date   LDLCALC 100 (H) 12/30/2018   Lab Results  Component Value Date   TRIG 99 12/30/2018   Lab Results  Component Value Date   CHOLHDL 3.1 12/30/2018    Review of Systems  Constitutional: Negative for fever, malaise/fatigue and weight loss.  HENT: Negative.  Negative for nosebleeds.   Eyes: Negative.  Negative for blurred vision, double vision and photophobia.  Respiratory: Negative.  Negative for cough and shortness of breath.   Cardiovascular: Negative.  Negative for chest pain, palpitations and leg swelling.  Gastrointestinal: Negative.  Negative for heartburn, nausea and vomiting.  Musculoskeletal: Positive for joint pain (chronic; generalized). Negative for myalgias.  Neurological: Negative.  Negative for dizziness, focal weakness, seizures and headaches.  Psychiatric/Behavioral: Negative.  Negative for suicidal ideas.    Past Medical History:  Diagnosis Date  . Arthritis   . Bursitis   . Hypertension     Past Surgical History:  Procedure Laterality Date  . FRACTURE SURGERY     collar bones  . FRACTURE SURGERY     right ankle    Family History  Problem Relation Age of Onset  . Hypertension Father   . Rheum arthritis Sister   . Diabetes Brother   . Hypertension Mother   . Colon cancer Neg Hx     Social History Reviewed with no changes to be made today.   Outpatient Medications Prior to Visit  Medication Sig Dispense Refill  . albuterol (PROVENTIL HFA;VENTOLIN HFA) 108 (90 Base) MCG/ACT inhaler Inhale 2 puffs into the lungs every 6 (six) hours as needed for wheezing or shortness of breath. 1 Inhaler 2  . diclofenac sodium (VOLTAREN) 1 % GEL Apply 2 g topically 4 (four) times daily as needed. Apply to affected areas. 100 g 2  . amLODipine (NORVASC) 10 MG tablet Take 1 tablet (10 mg total) by mouth daily. 30 tablet 2  . atorvastatin (LIPITOR) 20 MG tablet Take 1  tablet (20 mg total) by mouth daily. 90 tablet 1  . chlorthalidone (HYGROTON) 25 MG tablet Take 0.5 tablets (12.5 mg total) by mouth daily. 15 tablet 2  . losartan (COZAAR) 100 MG tablet Take 1 tablet (100 mg total) by mouth daily. 90 tablet 0   No facility-administered medications prior to visit.     No Known Allergies     Objective:    BP (!) 154/72 (BP Location: Left Arm, Patient Position: Sitting, Cuff Size: Normal)   Pulse 62   Temp 99.7 F (37.6 C) (Oral)   Ht 5\' 7"  (1.702 m)   Wt 129 lb 6.4 oz (58.7 kg)   SpO2 97%   BMI 20.27 kg/m  Wt Readings from Last 3 Encounters:  02/02/19 129 lb 6.4 oz (58.7 kg)  09/28/18 132 lb 6.4 oz (60.1 kg)  07/06/18 134 lb 6.4 oz (61 kg)    Physical Exam Vitals signs and nursing note reviewed.  Constitutional:      Appearance: He  is well-developed.  HENT:     Head: Normocephalic and atraumatic.  Neck:     Musculoskeletal: Normal range of motion.  Cardiovascular:     Rate and Rhythm: Normal rate and regular rhythm.     Heart sounds: Normal heart sounds. No murmur. No friction rub. No gallop.   Pulmonary:     Effort: Pulmonary effort is normal. No tachypnea or respiratory distress.     Breath sounds: Normal breath sounds. No decreased breath sounds, wheezing, rhonchi or rales.  Chest:     Chest wall: No tenderness.  Abdominal:     General: Bowel sounds are normal.     Palpations: Abdomen is soft.  Musculoskeletal: Normal range of motion.  Skin:    General: Skin is warm and dry.  Neurological:     Mental Status: He is alert and oriented to person, place, and time.     Coordination: Coordination normal.  Psychiatric:        Behavior: Behavior normal. Behavior is cooperative.        Thought Content: Thought content normal.        Judgment: Judgment normal.          Patient has been counseled extensively about nutrition and exercise as well as the importance of adherence with medications and regular follow-up. The patient was  given clear instructions to go to ER or return to medical center if symptoms don't improve, worsen or new problems develop. The patient verbalized understanding.   Follow-up: Return in about 3 weeks (around 02/23/2019) for BP recheck with luke.   Claiborne Rigg, FNP-BC [facility] [place], [place] [phone]   02/02/2019, 12:09 PM

## 2019-02-14 ENCOUNTER — Ambulatory Visit: Payer: Self-pay | Attending: Nurse Practitioner

## 2019-02-23 ENCOUNTER — Other Ambulatory Visit: Payer: Self-pay

## 2019-02-23 ENCOUNTER — Encounter: Payer: Self-pay | Admitting: Pharmacist

## 2019-02-23 ENCOUNTER — Ambulatory Visit: Payer: Self-pay | Attending: Family Medicine | Admitting: Pharmacist

## 2019-02-23 VITALS — BP 105/65 | HR 90

## 2019-02-23 DIAGNOSIS — I10 Essential (primary) hypertension: Secondary | ICD-10-CM

## 2019-02-23 NOTE — Progress Notes (Signed)
   S:    PCP: Bertram Denver   Patient arrives in good spirits. Presents to the clinic for hypertension management. Patient was referred by Zelda on 02/02/19.  BP at that visit 154/72 - no changes made because patient ran out of meds for a few days before that visit.   Patient reports adherence with medications. Patient denies chest pain, dyspnea, HA or blurred vision.   Current BP Medications include:  Amlodipine 10 mg daily, chlorthalidone 12.5 mg daily, and losartan 100 mg daily  Antihypertensives tried in the past include: carvedilol, hydralazine, HCTZ  Dietary habits include:  - Limits salt - Denies drinking caffiene Exercise habits include: - nothing outside of work Family / Social history:  - FHx: HTN (father, mother - both deceased) - Tobacco: using NRT via patch ~1 week; reports smoking 5-6 cigarettes so far in the month of March - Alcohol: denies use  Home BP readings: reports SBPs in 130s at home  O:  L arm after 5 minutes rest: 105/65, HR 90   Last 3 Office BP readings:  BP Readings from Last 3 Encounters:  02/02/19 (!) 154/72  12/30/18 (!) 171/73  11/29/18 (!) 152/73   BMET    Component Value Date/Time   NA 135 12/30/2018 1055   K 4.2 12/30/2018 1055   CL 95 (L) 12/30/2018 1055   CO2 22 12/30/2018 1055   GLUCOSE 111 (H) 12/30/2018 1055   GLUCOSE 77 11/04/2016 1230   BUN 12 12/30/2018 1055   CREATININE 1.05 12/30/2018 1055   CREATININE 1.10 11/04/2016 1230   CALCIUM 10.1 12/30/2018 1055   GFRNONAA 78 12/30/2018 1055   GFRAA 91 12/30/2018 1055   Renal function: CrCl cannot be calculated (Patient's most recent lab result is older than the maximum 21 days allowed.).  Clinical ASCVD: No  The 10-year ASCVD risk score Denman George DC Jr., et al., 2013) is: 25.2%   Values used to calculate the score:     Age: 58 years     Sex: Male     Is Non-Hispanic African American: Yes     Diabetic: No     Tobacco smoker: Yes     Systolic Blood Pressure: 154 mmHg     Is  BP treated: Yes     HDL Cholesterol: 56 mg/dL     Total Cholesterol: 176 mg/dL  A/P: Hypertension longstanding currently controlled on current medications. BP Goal <130/80 mmHg. Patient is adherent with current medications.   He has a hx of non-compliance, however, his BP has been controlled in the past when he takes his medicines as directed. I have emphasized the importance of adherence, and I took the time to show him his clinic BP readings that correlate with his medication adherence. He knows he can contact me or our pharmacy for refills.   -Continued current regimen. -Commended patient on adherence -Counseled on lifestyle modifications for blood pressure control including reduced dietary sodium, increased exercise, adequate sleep  Results reviewed and written information provided. Total time in face-to-face counseling 15 minutes.   F/U Clinic Visit in 1 month.  Butch Penny, PharmD, CPP Clinical Pharmacist [facility] [phone]

## 2019-02-23 NOTE — Patient Instructions (Signed)

## 2019-03-04 MED FILL — ?CHLORTHALIDONE 25 MG TABLE: 25 | 30 days supply | Qty: 15 | Fill #1

## 2019-03-07 MED FILL — AMLODIPINE BESYLATE 10 MG T: 10 | 60 days supply | Qty: 60 | Fill #1

## 2019-03-07 MED FILL — LOSARTAN POTASSIUM 100 MG T: 100 | 30 days supply | Qty: 30 | Fill #1

## 2019-03-07 MED FILL — ?ATORVASTATIN 20 MG TABLET: 20 | 90 days supply | Qty: 90 | Fill #1

## 2019-03-28 ENCOUNTER — Ambulatory Visit: Payer: Self-pay | Admitting: Pharmacist

## 2019-03-30 ENCOUNTER — Ambulatory Visit: Payer: Self-pay | Attending: Family Medicine | Admitting: Pharmacist

## 2019-03-30 ENCOUNTER — Other Ambulatory Visit: Payer: Self-pay

## 2019-03-30 DIAGNOSIS — I10 Essential (primary) hypertension: Secondary | ICD-10-CM

## 2019-03-30 MED FILL — ?CHLORTHALIDONE 25 MG TABLE: 25 | 30 days supply | Qty: 15 | Fill #2

## 2019-03-30 MED FILL — LOSARTAN POTASSIUM 100 MG T: 100 | 30 days supply | Qty: 30 | Fill #2

## 2019-03-30 NOTE — Progress Notes (Signed)
   S:    PCP: Bertram Denver   Patient arrives in good spirits. Presents to the clinic for hypertension management. Patient was referred by Zelda on 02/02/19.  I last saw him 02/23/19 and BP was well controlled. He has a hx of non-compliance but when he does adhere to his medication regimen, his BP is well-controlled.   Patient reports adherence with medications. Patient denies chest pain, dyspnea, HA or blurred vision.   Current BP Medications include:  Amlodipine 10 mg daily, chlorthalidone 12.5 mg daily, and losartan 100 mg daily  Antihypertensives tried in the past include: carvedilol, hydralazine, HCTZ  Dietary habits include:  - Limits salt - Denies drinking caffiene Exercise habits include: - Yard work - Walking  ~10 minutes every afternoon  Family / Social history:  - FHx: HTN (father, mother - both deceased) - Tobacco: using NRT via patch ~1 week; reports smoking down to 4 cigarettes/day  - Alcohol: occasionally   Home BP readings:  - Took at home this morning: 116/78, HR 88 - L arm (8 AM - before medications) - reports 110s/70s at home since last visit  O:  Last 3 Office BP readings:  BP Readings from Last 3 Encounters:  02/23/19 105/65  02/02/19 (!) 154/72  12/30/18 (!) 171/73   BMET    Component Value Date/Time   NA 135 12/30/2018 1055   K 4.2 12/30/2018 1055   CL 95 (L) 12/30/2018 1055   CO2 22 12/30/2018 1055   GLUCOSE 111 (H) 12/30/2018 1055   GLUCOSE 77 11/04/2016 1230   BUN 12 12/30/2018 1055   CREATININE 1.05 12/30/2018 1055   CREATININE 1.10 11/04/2016 1230   CALCIUM 10.1 12/30/2018 1055   GFRNONAA 78 12/30/2018 1055   GFRAA 91 12/30/2018 1055   Renal function: CrCl cannot be calculated (Patient's most recent lab result is older than the maximum 21 days allowed.).  Clinical ASCVD: No  The 10-year ASCVD risk score Denman George DC Jr., et al., 2013) is: 13%   Values used to calculate the score:     Age: 58 years     Sex: Male     Is Non-Hispanic  African American: Yes     Diabetic: No     Tobacco smoker: Yes     Systolic Blood Pressure: 105 mmHg     Is BP treated: Yes     HDL Cholesterol: 56 mg/dL     Total Cholesterol: 176 mg/dL  A/P: Hypertension longstanding currently controlled on current medications. BP Goal <130/80 mmHg. Patient is adherent with current medications.   He has a hx of non-compliance, however, his BP has been controlled in the past when he takes his medicines as directed. I have emphasized the importance of adherence, and I took the time to show him his clinic BP readings that correlate with his medication adherence. He knows he can contact me or our pharmacy for refills.   -Continued current regimen. -Commended patient on adherence -Counseled on lifestyle modifications for blood pressure control including reduced dietary sodium, increased exercise, adequate sleep  Results reviewed and written information provided. Total time in face-to-face counseling 15 minutes.   F/U w/ PCP  Butch Penny, PharmD, CPP Clinical Pharmacist [facility] [phone]

## 2019-05-05 MED FILL — LOSARTAN POTASSIUM 100 MG T: 100 | 30 days supply | Qty: 30 | Fill #2

## 2019-05-05 MED FILL — ?CHLORTHALIDONE 25 MG TABLE: 25 | 30 days supply | Qty: 15 | Fill #3

## 2019-06-08 MED FILL — LOSARTAN POTASSIUM 100 MG T: 100 | 90 days supply | Qty: 90 | Fill #0

## 2019-06-08 MED FILL — ?CHLORTHALIDONE 25 MG TABLE: 25 | 30 days supply | Qty: 15 | Fill #4

## 2019-06-08 MED FILL — ?ATORVASTATIN 20 MG TABLET: 20 | 60 days supply | Qty: 60 | Fill #2

## 2019-07-13 MED FILL — ?CHLORTHALIDONE 25 MG TABLE: 25 | 30 days supply | Qty: 15 | Fill #5

## 2019-08-05 ENCOUNTER — Ambulatory Visit: Payer: Self-pay | Attending: Nurse Practitioner | Admitting: Pharmacist

## 2019-08-05 ENCOUNTER — Other Ambulatory Visit: Payer: Self-pay

## 2019-08-05 DIAGNOSIS — Z23 Encounter for immunization: Secondary | ICD-10-CM

## 2019-08-05 MED FILL — ?ATORVASTATIN 20 MG TABLET: 20 | 30 days supply | Qty: 30 | Fill #0

## 2019-08-05 MED FILL — ?CHLORTHALIDONE 25 MG TABLE: 25 | 30 days supply | Qty: 15 | Fill #0

## 2019-08-05 NOTE — Progress Notes (Signed)
Patient presents for vaccination against influenza per orders of Zelda. Consent given. Counseling provided. No contraindications exists. Vaccine administered without incident.   

## 2019-09-07 ENCOUNTER — Other Ambulatory Visit: Payer: Self-pay | Admitting: Nurse Practitioner

## 2019-09-07 DIAGNOSIS — I10 Essential (primary) hypertension: Secondary | ICD-10-CM

## 2019-09-07 MED FILL — ?CHLORTHALIDONE 25 MG TABLE: 25 | 30 days supply | Qty: 15 | Fill #1

## 2019-09-07 MED FILL — ?ATORVASTATIN 20 MG TABLET: 20 | 30 days supply | Qty: 30 | Fill #1

## 2019-09-07 MED FILL — DICLOFENAC SODIUM 1% GEL: 1 | 12 days supply | Qty: 100 | Fill #1

## 2019-09-08 MED FILL — AMLODIPINE BESYLATE 10 MG T: 10 | 30 days supply | Qty: 30 | Fill #0

## 2019-09-08 MED FILL — LOSARTAN POTASSIUM 100 MG T: 100 | 30 days supply | Qty: 30 | Fill #0

## 2019-10-03 ENCOUNTER — Encounter: Payer: Self-pay | Admitting: Nurse Practitioner

## 2019-10-03 ENCOUNTER — Other Ambulatory Visit: Payer: Self-pay

## 2019-10-03 ENCOUNTER — Ambulatory Visit: Payer: Self-pay | Attending: Nurse Practitioner | Admitting: Nurse Practitioner

## 2019-10-03 VITALS — BP 136/67 | HR 60 | Temp 98.4°F | Ht 67.0 in | Wt 128.0 lb

## 2019-10-03 DIAGNOSIS — I10 Essential (primary) hypertension: Secondary | ICD-10-CM

## 2019-10-03 DIAGNOSIS — F17200 Nicotine dependence, unspecified, uncomplicated: Secondary | ICD-10-CM

## 2019-10-03 DIAGNOSIS — J449 Chronic obstructive pulmonary disease, unspecified: Secondary | ICD-10-CM

## 2019-10-03 DIAGNOSIS — E782 Mixed hyperlipidemia: Secondary | ICD-10-CM

## 2019-10-03 DIAGNOSIS — M25511 Pain in right shoulder: Secondary | ICD-10-CM

## 2019-10-03 MED ORDER — LOSARTAN POTASSIUM 100 MG PO TABS: 100.0000 mg | ORAL_TABLET | Freq: Every day | ORAL | 0 refills | Status: DC

## 2019-10-03 MED ORDER — ATORVASTATIN CALCIUM 20 MG PO TABS: 20.0000 mg | ORAL_TABLET | Freq: Every day | ORAL | 1 refills | Status: DC

## 2019-10-03 MED ORDER — CHLORTHALIDONE 25 MG PO TABS: 12.5000 mg | ORAL_TABLET | Freq: Every day | ORAL | 1 refills | Status: DC

## 2019-10-03 MED ORDER — FLOVENT HFA 110 MCG/ACT IN AERO: 2.0000 | INHALATION_SPRAY | Freq: Two times a day (BID) | RESPIRATORY_TRACT | 12 refills | Status: DC

## 2019-10-03 MED ORDER — ALBUTEROL SULFATE HFA 108 (90 BASE) MCG/ACT IN AERS: 2.0000 | INHALATION_SPRAY | Freq: Four times a day (QID) | RESPIRATORY_TRACT | 1 refills | Status: DC | PRN

## 2019-10-03 MED ORDER — AMLODIPINE BESYLATE 10 MG PO TABS: 10.0000 mg | ORAL_TABLET | Freq: Every day | ORAL | 1 refills | Status: DC

## 2019-10-03 MED ORDER — MELOXICAM 7.5 MG PO TABS: 7.5000 mg | ORAL_TABLET | Freq: Every day | ORAL | 1 refills | Status: DC

## 2019-10-03 MED FILL — ?CHLORTHALIDONE 25 MG TABLE: 25 | 90 days supply | Qty: 45 | Fill #0

## 2019-10-03 MED FILL — AMLODIPINE BESYLATE 10 MG T: 10 | 90 days supply | Qty: 90 | Fill #0

## 2019-10-03 MED FILL — !FLOVENT HFA 110 MCG INHALE: 110 | 30 days supply | Qty: 12 | Fill #0

## 2019-10-03 MED FILL — MELOXICAM 7.5 MG TABLET: 7.5 | 30 days supply | Qty: 30 | Fill #0

## 2019-10-03 MED FILL — ATORVASTATIN CALCIUM 20 MG: 20 | 30 days supply | Qty: 30 | Fill #0

## 2019-10-03 MED FILL — !VENTOLIN HFA INHALER: 108 (90 BAS | 25 days supply | Qty: 18 | Fill #0

## 2019-10-03 NOTE — Progress Notes (Signed)
Assessment & Plan:  Ruben Fuentes was seen today for follow-up.  Diagnoses and all orders for this visit:  Essential hypertension -     CMP14+EGFR -     CBC -     amLODipine (NORVASC) 10 MG tablet; Take 1 tablet (10 mg total) by mouth daily. -     chlorthalidone (HYGROTON) 25 MG tablet; Take 0.5 tablets (12.5 mg total) by mouth daily. -     losartan (COZAAR) 100 MG tablet; Take 1 tablet (100 mg total) by mouth daily. Continue all antihypertensives as prescribed.  Remember to bring in your blood pressure log with you for your follow up appointment.  DASH/Mediterranean Diets are healthier choices for HTN.    Tobacco dependence Ruben Fuentes was counseled on the dangers of tobacco use, and was advised to quit. Reviewed strategies to maximize success, including removing cigarettes and smoking materials from environment, stress management and support of family/friends as well as pharmacological alternatives including: Wellbutrin, Chantix, Nicotine patch, Nicotine gum or lozenges. Smoking cessation support: smoking cessation hotline: 1-800-QUIT-NOW.  Smoking cessation classes are also available through [facility]. Call [phone] or visit our website at [email or web address]   A total of 3 minutes was spent on counseling for smoking cessation and Ruben Fuentes is not ready to quit.   Hyperlipidemia, mixed -     atorvastatin (LIPITOR) 20 MG tablet; Take 1 tablet (20 mg total) by mouth daily. INSTRUCTIONS: Work on a low fat, heart healthy diet and participate in regular aerobic exercise program by working out at least 150 minutes per week; 5 days a week-30 minutes per day. Avoid red meat/beef/steak,  fried foods. junk foods, sodas, sugary drinks, unhealthy snacking, alcohol and smoking.  Drink at least 80 oz of water per day and monitor your carbohydrate intake daily.    Shortness of breath -     albuterol (VENTOLIN HFA) 108 (90 Base) MCG/ACT inhaler; Inhale 2 puffs into the lungs every 6  (six) hours as needed for wheezing or shortness of breath.  Pain in joint of right shoulder -     meloxicam (MOBIC) 7.5 MG tablet; Take 1 tablet (7.5 mg total) by mouth daily. May alternate with heat and ice application for pain relief. May also alternate with acetaminophen as prescribed pain relief. Other alternatives include massage, acupuncture and water aerobics.  You must stay active and avoid a sedentary lifestyle.   Patient has been counseled on age-appropriate routine health concerns for screening and prevention. These are reviewed and up-to-date. Referrals have been placed accordingly. Immunizations are up-to-date or declined.    Subjective:   Chief Complaint  Patient presents with  . Follow-up    Pt. is here for blood pressure check up. Pt. is having pain on both of his shoulder.    HPI Ruben Fuentes 58 y.o. male presents to office today for HTN.   Essential Hypertension Well controlled. Running low on a  few of his medications today. Current medications include amlodipine 10 mg daily, losartan 100 mg daily and chlorthalidone 12.5 mg daily. Denies chest pain, shortness of breath, palpitations, lightheadedness, dizziness, headaches or BLE edema. He does monitor his blood pressure at home.  BP Readings from Last 3 Encounters:  10/03/19 136/67  02/23/19 105/65  02/02/19 (!) 154/72    Shoulder Pain Patient complaints of bilateral shoulder pain with  right > left. Denies any trauma or injury. The pain is described as aching and stiffness.  The onset of the pain was gradual.  The  pain occurs continuously.  Location is global, acromioclavicular joint. No history of dislocation. Symptoms are aggravated by all activities. Symptoms are diminished by  rest. mild stiffness, no weakness, no swelling, no crepitus noted is reported. He has been using diclofenac gel with on relief of symptoms.   COPD Out of flovent inhlaer. Has been using albuterol inhaler excessively. Continues to smoke  cigarettes daily.    Review of Systems  Constitutional: Negative for fever, malaise/fatigue and weight loss.  HENT: Negative.  Negative for nosebleeds.   Eyes: Negative.  Negative for blurred vision, double vision and photophobia.  Respiratory: Negative.  Negative for cough and shortness of breath.   Cardiovascular: Negative.  Negative for chest pain, palpitations and leg swelling.  Gastrointestinal: Negative.  Negative for heartburn, nausea and vomiting.  Musculoskeletal: Positive for joint pain. Negative for myalgias.  Neurological: Negative.  Negative for dizziness, focal weakness, seizures and headaches.  Psychiatric/Behavioral: Negative.  Negative for suicidal ideas.    Past Medical History:  Diagnosis Date  . Arthritis   . Bursitis   . Hypertension     Past Surgical History:  Procedure Laterality Date  . FRACTURE SURGERY     collar bones  . FRACTURE SURGERY     right ankle    Family History  Problem Relation Age of Onset  . Hypertension Father   . Rheum arthritis Sister   . Diabetes Brother   . Hypertension Mother   . Colon cancer Neg Hx     Social History Reviewed with no changes to be made today.   Outpatient Medications Prior to Visit  Medication Sig Dispense Refill  . diclofenac sodium (VOLTAREN) 1 % GEL Apply 2 g topically 4 (four) times daily as needed. Apply to affected areas. 100 g 2  . albuterol (PROVENTIL HFA;VENTOLIN HFA) 108 (90 Base) MCG/ACT inhaler Inhale 2 puffs into the lungs every 6 (six) hours as needed for wheezing or shortness of breath. 1 Inhaler 2  . amLODipine (NORVASC) 10 MG tablet TAKE 1 TABLET (10 MG TOTAL) BY MOUTH DAILY. 30 tablet 0  . atorvastatin (LIPITOR) 20 MG tablet Take 1 tablet (20 mg total) by mouth daily. 90 tablet 1  . losartan (COZAAR) 100 MG tablet TAKE 1 TABLET (100 MG TOTAL) BY MOUTH DAILY. 30 tablet 0  . chlorthalidone (HYGROTON) 25 MG tablet Take 0.5 tablets (12.5 mg total) by mouth daily. 45 tablet 1   No  facility-administered medications prior to visit.     No Known Allergies     Objective:    BP 136/67 (BP Location: Left Arm, Patient Position: Sitting, Cuff Size: Normal)   Pulse 60   Temp 98.4 F (36.9 C) (Oral)   Ht _0  (1.702 m)   Wt 128 lb (58.1 kg)   SpO2 100%   BMI 20.05 kg/m  Wt Readings from Last 3 Encounters:  10/03/19 128 lb (58.1 kg)  02/02/19 129 lb 6.4 oz (58.7 kg)  09/28/18 132 lb 6.4 oz (60.1 kg)    Physical Exam Vitals signs and nursing note reviewed.  Constitutional:      Appearance: He is well-developed.  HENT:     Head: Normocephalic and atraumatic.  Neck:     Musculoskeletal: Normal range of motion.  Cardiovascular:     Rate and Rhythm: Normal rate and regular rhythm.     Heart sounds: Normal heart sounds. No murmur. No friction rub. No gallop.   Pulmonary:     Effort: Pulmonary effort is normal. No tachypnea or  respiratory distress.     Breath sounds: Normal breath sounds. No decreased breath sounds, wheezing, rhonchi or rales.  Chest:     Chest wall: No tenderness.  Abdominal:     General: Bowel sounds are normal.     Palpations: Abdomen is soft.  Musculoskeletal: Normal range of motion.  Skin:    General: Skin is warm and dry.  Neurological:     Mental Status: He is alert and oriented to person, place, and time.     Coordination: Coordination normal.  Psychiatric:        Behavior: Behavior normal. Behavior is cooperative.        Thought Content: Thought content normal.        Judgment: Judgment normal.          Patient has been counseled extensively about nutrition and exercise as well as the importance of adherence with medications and regular follow-up. The patient was given clear instructions to go to ER or return to medical center if symptoms don't improve, worsen or new problems develop. The patient verbalized understanding.   Follow-up: Return for 6 weeks with me right shoulder.   Gildardo Pounds, FNP-BC [facility] and [facility], [place]   10/03/2019, 12:55 PM

## 2019-10-04 LAB — CMP14+EGFR
ALT: 32 IU/L (ref 0–44)
AST: 34 IU/L (ref 0–40)
Albumin/Globulin Ratio: 2 (ref 1.2–2.2)
Albumin: 4.9 g/dL (ref 3.8–4.9)
Alkaline Phosphatase: 81 IU/L (ref 39–117)
BUN/Creatinine Ratio: 14 (ref 9–20)
BUN: 16 mg/dL (ref 6–24)
Bilirubin Total: 0.4 mg/dL (ref 0.0–1.2)
CO2: 23 mmol/L (ref 20–29)
Calcium: 10.2 mg/dL (ref 8.7–10.2)
Chloride: 95 mmol/L — ABNORMAL LOW (ref 96–106)
Creatinine, Ser: 1.17 mg/dL (ref 0.76–1.27)
GFR calc Af Amer: 79 mL/min/{1.73_m2} (ref >59)
GFR calc non Af Amer: 68 mL/min/{1.73_m2} (ref >59)
Globulin, Total: 2.5 g/dL (ref 1.5–4.5)
Glucose: 108 mg/dL — ABNORMAL HIGH (ref 65–99)
Potassium: 4.1 mmol/L (ref 3.5–5.2)
Sodium: 134 mmol/L (ref 134–144)
Total Protein: 7.4 g/dL (ref 6.0–8.5)

## 2019-10-04 LAB — CBC
Hematocrit: 37.9 % (ref 37.5–51.0)
Hemoglobin: 12.9 g/dL — ABNORMAL LOW (ref 13.0–17.7)
MCH: 29.5 pg (ref 26.6–33.0)
MCHC: 34 g/dL (ref 31.5–35.7)
MCV: 87 fL (ref 79–97)
Platelets: 296 10*3/uL (ref 150–450)
RBC: 4.38 x10E6/uL (ref 4.14–5.80)
RDW: 12.2 % (ref 11.6–15.4)
WBC: 6.4 10*3/uL (ref 3.4–10.8)

## 2019-10-14 ENCOUNTER — Other Ambulatory Visit: Payer: Self-pay

## 2019-10-14 ENCOUNTER — Ambulatory Visit: Payer: Self-pay

## 2019-11-07 ENCOUNTER — Other Ambulatory Visit: Payer: Self-pay | Admitting: Family Medicine

## 2019-11-07 DIAGNOSIS — I10 Essential (primary) hypertension: Secondary | ICD-10-CM

## 2019-11-07 MED FILL — MELOXICAM 7.5 MG TABLET: 7.5 | 30 days supply | Qty: 30 | Fill #1

## 2019-11-09 MED FILL — LOSARTAN POTASSIUM 100 MG T: 100 | 30 days supply | Qty: 30 | Fill #0

## 2019-11-14 ENCOUNTER — Ambulatory Visit: Payer: Self-pay | Attending: Nurse Practitioner | Admitting: Nurse Practitioner

## 2019-11-14 ENCOUNTER — Encounter: Payer: Self-pay | Admitting: Nurse Practitioner

## 2019-11-14 DIAGNOSIS — E782 Mixed hyperlipidemia: Secondary | ICD-10-CM

## 2019-11-14 DIAGNOSIS — M159 Polyosteoarthritis, unspecified: Secondary | ICD-10-CM

## 2019-11-14 DIAGNOSIS — M8949 Other hypertrophic osteoarthropathy, multiple sites: Secondary | ICD-10-CM

## 2019-11-14 DIAGNOSIS — M25511 Pain in right shoulder: Secondary | ICD-10-CM

## 2019-11-14 DIAGNOSIS — G8929 Other chronic pain: Secondary | ICD-10-CM

## 2019-11-14 MED ORDER — ATORVASTATIN CALCIUM 20 MG PO TABS: 20.0000 mg | ORAL_TABLET | Freq: Every day | ORAL | 1 refills | Status: DC

## 2019-11-14 MED ORDER — NAPROXEN 500 MG PO TABS: 500.0000 mg | ORAL_TABLET | Freq: Two times a day (BID) | ORAL | 0 refills | Status: DC

## 2019-11-14 MED FILL — NAPROXEN 500 MG TABLET: 500 | 30 days supply | Qty: 60 | Fill #0

## 2019-11-14 MED FILL — ATORVASTATIN CALCIUM 20 MG: 20 | 90 days supply | Qty: 90 | Fill #0

## 2019-11-14 NOTE — Progress Notes (Signed)
Virtual Visit via Telephone Note Due to national recommendations of social distancing due to COVID 19, telehealth visit is felt to be most appropriate for this patient at this time.  I discussed the limitations, risks, security and privacy concerns of performing an evaluation and management service by telephone and the availability of in person appointments. I also discussed with the patient that there may be a patient responsible charge related to this service. The patient expressed understanding and agreed to proceed.    I connected with Rudi Rummage on 11/14/19  at   9:10 AM EST  EDT by telephone and verified that I am speaking with the correct person using two identifiers.   Consent I discussed the limitations, risks, security and privacy concerns of performing an evaluation and management service by telephone and the availability of in person appointments. I also discussed with the patient that there may be a patient responsible charge related to this service. The patient expressed understanding and agreed to proceed.   Location of Patient: Private Residence   [facility] Provider[facility]    Persons participating in Telemedicine visit: Bertram Denver FNP-BC YY Anderson CMA Rudi Rummage    History of Present Illness: Telemedicine visit for: Right shoulder Pain  Chronic with onset a few months ago.  He does endorse bilateral shoulder pain with right greater than left.  Denies any trauma or inciting events and pain is described as sharp, aching, and stiff.  Location is global and that acromioclavicular joint.  Aggravating factors: All activities.  Relieving factors: Rest, he denies any swelling, crepitus, or weakness.  Diclofenac gel does provide some relief of his pain.  He was prescribed meloxicam 7.5 mg at his last office visit with me October 26 however today he states the meloxicam has been ineffective in helping to relieve his pain.   Past  Medical History:  Diagnosis Date  . Arthritis   . Bursitis   . Hypertension     Past Surgical History:  Procedure Laterality Date  . FRACTURE SURGERY     collar bones  . FRACTURE SURGERY     right ankle    Family History  Problem Relation Age of Onset  . Hypertension Father   . Rheum arthritis Sister   . Diabetes Brother   . Hypertension Mother   . Colon cancer Neg Hx     Social History   Socioeconomic History  . Marital status: Married    Spouse name: Not on file  . Number of children: Not on file  . Years of education: Not on file  . Highest education level: Not on file  Occupational History  . Not on file  Social Needs  . Financial resource strain: Not on file  . Food insecurity    Worry: Not on file    Inability: Not on file  . Transportation needs    Medical: Not on file    Non-medical: Not on file  Tobacco Use  . Smoking status: Light Tobacco Smoker    Packs/day: 0.25    Years: 30.00    Pack years: 7.50    Types: Cigarettes  . Smokeless tobacco: Never Used  Substance and Sexual Activity  . Alcohol use: Yes    Alcohol/week: 2.0 standard drinks    Types: 2 Cans of beer per week    Comment: 2 cans of beer a day.   . Drug use: Yes    Types: Marijuana  . Sexual activity: Not  Currently  Lifestyle  . Physical activity    Days per week: Not on file    Minutes per session: Not on file  . Stress: Not on file  Relationships  . Social Herbalist on phone: Not on file    Gets together: Not on file    Attends religious service: Not on file    Active member of club or organization: Not on file    Attends meetings of clubs or organizations: Not on file    Relationship status: Not on file  Other Topics Concern  . Not on file  Social History Narrative  . Not on file     Observations/Objective: Awake, alert and oriented x 3   Review of Systems  Constitutional: Negative for fever, malaise/fatigue and weight loss.  HENT: Negative.  Negative  for nosebleeds.   Eyes: Negative.  Negative for blurred vision, double vision and photophobia.  Respiratory: Negative.  Negative for cough and shortness of breath.   Cardiovascular: Negative.  Negative for chest pain, palpitations and leg swelling.  Gastrointestinal: Negative.  Negative for heartburn, nausea and vomiting.  Musculoskeletal: Positive for joint pain. Negative for myalgias.  Neurological: Negative.  Negative for dizziness, focal weakness, seizures and headaches.  Psychiatric/Behavioral: Negative.  Negative for suicidal ideas.    Assessment and Plan: Chirstopher was seen today for follow-up.  Diagnoses and all orders for this visit:  Primary osteoarthritis involving multiple joints -     naproxen (NAPROSYN) 500 MG tablet; Take 1 tablet (500 mg total) by mouth 2 (two) times daily with a meal. -     DG Shoulder Right; Future  Hyperlipidemia, mixed -     atorvastatin (LIPITOR) 20 MG tablet; Take 1 tablet (20 mg total) by mouth daily. Please fill as a 90 day supply INSTRUCTIONS: Work on a low fat, heart healthy diet and participate in regular aerobic exercise program by working out at least 150 minutes per week; 5 days a week-30 minutes per day. Avoid red meat/beef/steak,  fried foods. junk foods, sodas, sugary drinks, unhealthy snacking, alcohol and smoking.  Drink at least 80 oz of water per day and monitor your carbohydrate intake daily.    Chronic right shoulder pain -     naproxen (NAPROSYN) 500 MG tablet; Take 1 tablet (500 mg total) by mouth 2 (two) times daily with a meal. -     DG Shoulder Right; Future May alternate with heat and ice application for pain relief. May also alternate with Tylenol arthritis or extra strength Tylenol  as prescribed pain relief. Other alternatives include massage, acupuncture and water aerobics.  You must stay active and avoid a sedentary lifestyle.  He was instructed to avoid ibuprofen, meloxicam, Advil, Aleve and Motrin while taking  naproxen.  Follow Up Instructions Return in about 6 weeks (around 12/26/2019).     I discussed the assessment and treatment plan with the patient. The patient was provided an opportunity to ask questions and all were answered. The patient agreed with the plan and demonstrated an understanding of the instructions.   The patient was advised to call back or seek an in-person evaluation if the symptoms worsen or if the condition fails to improve as anticipated.  I provided 18 minutes of non-face-to-face time during this encounter including median intraservice time, reviewing previous notes, labs, imaging, medications and explaining diagnosis and management.  Gildardo Pounds, FNP-BC

## 2019-12-23 ENCOUNTER — Other Ambulatory Visit: Payer: Self-pay | Admitting: Nurse Practitioner

## 2019-12-23 DIAGNOSIS — M25511 Pain in right shoulder: Secondary | ICD-10-CM

## 2019-12-23 DIAGNOSIS — M159 Polyosteoarthritis, unspecified: Secondary | ICD-10-CM

## 2019-12-23 DIAGNOSIS — G8929 Other chronic pain: Secondary | ICD-10-CM

## 2019-12-23 MED FILL — LOSARTAN POTASSIUM 100 MG T: 100 | 30 days supply | Qty: 30 | Fill #1

## 2019-12-26 MED FILL — ?CHLORTHALIDONE 25MG TABL: 25 | 90 days supply | Qty: 45 | Fill #1

## 2019-12-26 MED FILL — ?AMLODIPINE BESYLATE 10 MG: 10 | 90 days supply | Qty: 90 | Fill #1

## 2020-01-12 ENCOUNTER — Other Ambulatory Visit: Payer: Self-pay | Admitting: Nurse Practitioner

## 2020-01-12 DIAGNOSIS — M159 Polyosteoarthritis, unspecified: Secondary | ICD-10-CM

## 2020-01-12 DIAGNOSIS — G8929 Other chronic pain: Secondary | ICD-10-CM

## 2020-01-12 MED FILL — ?NAPROXEN 500 MG TABS: 500 | 30 days supply | Qty: 60 | Fill #0

## 2020-01-27 MED FILL — LOSARTAN POTASSIUM 100 MG T: 100 | 30 days supply | Qty: 30 | Fill #2

## 2020-02-08 MED FILL — ATORVASTATIN CALCIUM 20 MG: 20 | 90 days supply | Qty: 90 | Fill #1

## 2020-02-10 ENCOUNTER — Ambulatory Visit: Payer: Self-pay

## 2020-02-17 ENCOUNTER — Ambulatory Visit: Payer: Self-pay | Attending: Internal Medicine

## 2020-02-17 DIAGNOSIS — Z23 Encounter for immunization: Secondary | ICD-10-CM

## 2020-02-17 NOTE — Progress Notes (Signed)
   Covid-19 Vaccination Clinic  Name:  JOHNLUKE HAUGEN    MRN: [id number] DOB: [birth year]  02/17/2020  [name] was observed post Covid-19 immunization for 15 minutes without incident. He was provided with Vaccine Information Sheet and instruction to access the V-Safe system.   [name] was instructed to call 911 with any severe reactions post vaccine: Marland Kitchen Difficulty breathing  . Swelling of face and throat  . A fast heartbeat  . A bad rash all over body  . Dizziness and weakness   Immunizations Administered    Name Date Dose VIS Date Route   Pfizer COVID-19 Vaccine 02/17/2020 10:58 AM 0.3 mL 11/18/2019 Intramuscular   Manufacturer: [employer], [employer]   Lot: [id number]   NDC: [address]-2

## 2020-02-21 ENCOUNTER — Ambulatory Visit: Payer: Self-pay | Admitting: Nurse Practitioner

## 2020-03-12 ENCOUNTER — Ambulatory Visit: Payer: Self-pay | Attending: Internal Medicine

## 2020-03-12 DIAGNOSIS — Z23 Encounter for immunization: Secondary | ICD-10-CM

## 2020-03-12 NOTE — Progress Notes (Signed)
   Covid-19 Vaccination Clinic  Name:  RAFAL ARCHULETA    MRN: [id number] DOB: [birth year]  03/12/2020  Mr. Vo was observed post Covid-19 immunization for 15 minutes without incident. He was provided with Vaccine Information Sheet and instruction to access the V-Safe system.   Mr. Abernethy was instructed to call 911 with any severe reactions post vaccine: Marland Kitchen Difficulty breathing  . Swelling of face and throat  . A fast heartbeat  . A bad rash all over body  . Dizziness and weakness   Immunizations Administered    Name Date Dose VIS Date Route   Pfizer COVID-19 Vaccine 03/12/2020  2:04 PM 0.3 mL 11/18/2019 Intramuscular   Manufacturer: [employer], [employer]   Lot: [id number]   NDC: [address]-9

## 2020-03-15 ENCOUNTER — Ambulatory Visit: Payer: Self-pay | Admitting: Nurse Practitioner

## 2020-03-16 ENCOUNTER — Ambulatory Visit: Payer: Self-pay | Admitting: Nurse Practitioner

## 2020-03-16 ENCOUNTER — Telehealth: Payer: Self-pay | Admitting: Nurse Practitioner

## 2020-03-16 ENCOUNTER — Other Ambulatory Visit: Payer: Self-pay | Admitting: Pharmacist

## 2020-03-16 DIAGNOSIS — E782 Mixed hyperlipidemia: Secondary | ICD-10-CM

## 2020-03-16 DIAGNOSIS — G8929 Other chronic pain: Secondary | ICD-10-CM

## 2020-03-16 DIAGNOSIS — F17200 Nicotine dependence, unspecified, uncomplicated: Secondary | ICD-10-CM

## 2020-03-16 DIAGNOSIS — M159 Polyosteoarthritis, unspecified: Secondary | ICD-10-CM

## 2020-03-16 DIAGNOSIS — I10 Essential (primary) hypertension: Secondary | ICD-10-CM

## 2020-03-16 DIAGNOSIS — J449 Chronic obstructive pulmonary disease, unspecified: Secondary | ICD-10-CM

## 2020-03-16 MED ORDER — CHLORTHALIDONE 25 MG PO TABS: 12.5000 mg | ORAL_TABLET | Freq: Every day | ORAL | 0 refills | Status: DC

## 2020-03-16 MED ORDER — ALBUTEROL SULFATE HFA 108 (90 BASE) MCG/ACT IN AERS: 2.0000 | INHALATION_SPRAY | Freq: Four times a day (QID) | RESPIRATORY_TRACT | 0 refills | Status: DC | PRN

## 2020-03-16 MED ORDER — ATORVASTATIN CALCIUM 20 MG PO TABS: 20.0000 mg | ORAL_TABLET | Freq: Every day | ORAL | 0 refills | Status: DC

## 2020-03-16 MED ORDER — NAPROXEN 500 MG PO TABS: 500.0000 mg | ORAL_TABLET | Freq: Two times a day (BID) | ORAL | 0 refills | Status: DC

## 2020-03-16 MED ORDER — LOSARTAN POTASSIUM 100 MG PO TABS: 100.0000 mg | ORAL_TABLET | Freq: Every day | ORAL | 0 refills | Status: DC

## 2020-03-16 MED ORDER — AMLODIPINE BESYLATE 10 MG PO TABS: 10.0000 mg | ORAL_TABLET | Freq: Every day | ORAL | 0 refills | Status: DC

## 2020-03-16 MED FILL — ?CHLORTHALIDONE 25MG TABL: 25 | 30 days supply | Qty: 15 | Fill #0

## 2020-03-16 MED FILL — AMLODIPINE BESYLATE 10 MG T: 10 | 30 days supply | Qty: 30 | Fill #0

## 2020-03-16 MED FILL — !VENTOLIN HFA INHALER: 108 (90 BAS | 25 days supply | Qty: 18 | Fill #0

## 2020-03-16 MED FILL — ?NAPROXEN 500 MG TABS: 500 | 30 days supply | Qty: 60 | Fill #0

## 2020-03-16 MED FILL — LOSARTAN POTASSIUM 100 MG T: 100 | 30 days supply | Qty: 30 | Fill #0

## 2020-03-16 NOTE — Telephone Encounter (Signed)
Error

## 2020-03-21 NOTE — Progress Notes (Signed)
Patient did not show for appointment.   

## 2020-03-22 ENCOUNTER — Ambulatory Visit (HOSPITAL_BASED_OUTPATIENT_CLINIC_OR_DEPARTMENT_OTHER): Payer: Self-pay | Admitting: Family

## 2020-03-22 DIAGNOSIS — Z5329 Procedure and treatment not carried out because of patient's decision for other reasons: Secondary | ICD-10-CM

## 2020-04-18 ENCOUNTER — Other Ambulatory Visit: Payer: Self-pay

## 2020-04-18 ENCOUNTER — Ambulatory Visit: Payer: Self-pay | Attending: Nurse Practitioner | Admitting: Nurse Practitioner

## 2020-04-18 ENCOUNTER — Encounter: Payer: Self-pay | Admitting: Nurse Practitioner

## 2020-04-18 VITALS — BP 158/66 | HR 69 | Temp 97.7°F | Ht 67.0 in | Wt 122.0 lb

## 2020-04-18 DIAGNOSIS — I10 Essential (primary) hypertension: Secondary | ICD-10-CM

## 2020-04-18 DIAGNOSIS — F17200 Nicotine dependence, unspecified, uncomplicated: Secondary | ICD-10-CM

## 2020-04-18 DIAGNOSIS — E782 Mixed hyperlipidemia: Secondary | ICD-10-CM

## 2020-04-18 DIAGNOSIS — J449 Chronic obstructive pulmonary disease, unspecified: Secondary | ICD-10-CM

## 2020-04-18 DIAGNOSIS — M1990 Unspecified osteoarthritis, unspecified site: Secondary | ICD-10-CM

## 2020-04-18 DIAGNOSIS — R202 Paresthesia of skin: Secondary | ICD-10-CM

## 2020-04-18 MED ORDER — CHLORTHALIDONE 25 MG PO TABS: 12.5000 mg | ORAL_TABLET | Freq: Every day | ORAL | 1 refills | Status: DC

## 2020-04-18 MED ORDER — LOSARTAN POTASSIUM 100 MG PO TABS: 100.0000 mg | ORAL_TABLET | Freq: Every day | ORAL | 1 refills | Status: DC

## 2020-04-18 MED ORDER — FLOVENT HFA 110 MCG/ACT IN AERO: 2.0000 | INHALATION_SPRAY | Freq: Two times a day (BID) | RESPIRATORY_TRACT | 12 refills | Status: DC

## 2020-04-18 MED ORDER — AMLODIPINE BESYLATE 10 MG PO TABS: 10.0000 mg | ORAL_TABLET | Freq: Every day | ORAL | 1 refills | Status: DC

## 2020-04-18 MED ORDER — ATORVASTATIN CALCIUM 20 MG PO TABS: 20.0000 mg | ORAL_TABLET | Freq: Every day | ORAL | 1 refills | Status: DC

## 2020-04-18 MED ORDER — DICLOFENAC SODIUM 1 % EX GEL: 4.0000 g | Freq: Four times a day (QID) | CUTANEOUS | 1 refills | Status: DC

## 2020-04-18 MED ORDER — GABAPENTIN 100 MG PO CAPS: 100.0000 mg | ORAL_CAPSULE | Freq: Three times a day (TID) | ORAL | 3 refills | Status: DC

## 2020-04-18 NOTE — Progress Notes (Addendum)
Assessment & Plan:  [name] was seen today for hypertension.  Diagnoses and all orders for this visit:  Left hand paresthesia -     gabapentin (NEURONTIN) 100 MG capsule; Take 1 capsule (100 mg total) by mouth 3 (three) times daily. -     HIV antibody (with reflex) -     B12 and Folate Panel  Hyperlipidemia, mixed -     Lipid panel -     atorvastatin (LIPITOR) 20 MG tablet; Take 1 tablet (20 mg total) by mouth daily. INSTRUCTIONS: Work on a low fat, heart healthy diet and participate in regular aerobic exercise program by working out at least 150 minutes per week; 5 days a week-30 minutes per day. Avoid red meat/beef/steak,  fried foods. junk foods, sodas, sugary drinks, unhealthy snacking, alcohol and smoking.  Drink at least 80 oz of water per day and monitor your carbohydrate intake daily.   Essential hypertension -     CMP14+EGFR -     amLODipine (NORVASC) 10 MG tablet; Take 1 tablet (10 mg total) by mouth daily. -     chlorthalidone (HYGROTON) 25 MG tablet; Take 0.5 tablets (12.5 mg total) by mouth daily. -     losartan (COZAAR) 100 MG tablet; Take 1 tablet (100 mg total) by mouth daily. Continue all antihypertensives as prescribed.  Remember to bring in your blood pressure log with you for your follow up appointment.  DASH/Mediterranean Diets are healthier choices for HTN.    Tobacco dependence -     fluticasone (FLOVENT HFA) 110 MCG/ACT inhaler; Inhale 2 puffs into the lungs 2 (two) times daily.  COPD GOLD II -     fluticasone (FLOVENT HFA) 110 MCG/ACT inhaler; Inhale 2 puffs into the lungs 2 (two) times daily.  Arthritis -     diclofenac Sodium (VOLTAREN) 1 % GEL; Apply 4 g topically 4 (four) times daily.    Patient has been counseled on age-appropriate routine health concerns for screening and prevention. These are reviewed and up-to-date. Referrals have been placed accordingly. Immunizations are up-to-date or declined.    Subjective:   Chief Complaint  Patient  presents with  . Hypertension    Pt. is here for blood pressure check. Pt. state he's been having left hand numbness for a week.    HPI Ruben Fuentes 59 y.o. male presents to office today for HTN.    Essential Hypertension He has been out of his blood pressure medications for a few days. Currently taking amlodipine 10 mg, chlorthalidone 25 mg , losartan 100 mg daily. Denies chest pain, shortness of breath, palpitations, lightheadedness, dizziness, headaches or BLE edema. He does not monitor his blood   BP Readings from Last 3 Encounters:  04/18/20 (!) 158/66  10/03/19 136/67  02/23/19 105/65    Neuropathy  He describes symptoms of numbness. Onset of symptoms was gradual, starting about 1 week ago. Symptoms are currently of mild and moderate severity. Symptoms occur all day and last all day. The patient denies lancinating pain, cramping, squeezing and hypersensitivity.He denies left arm numbness or any left sided weakness. This is the first time he is being evaluated for his current symptoms. Numbness is present only in digits 2-5 of the left hand.   Review of Systems  Constitutional: Negative for fever, malaise/fatigue and weight loss.  HENT: Negative.  Negative for nosebleeds.   Eyes: Negative.  Negative for blurred vision, double vision and photophobia.  Respiratory: Negative.  Negative for cough and shortness of breath.  Cardiovascular: Negative.  Negative for chest pain, palpitations and leg swelling.  Gastrointestinal: Negative.  Negative for heartburn, nausea and vomiting.  Musculoskeletal: Positive for joint pain (left elbow and left knee). Negative for myalgias.  Neurological: Positive for tingling and sensory change. Negative for dizziness, tremors, focal weakness, seizures, weakness and headaches.  Psychiatric/Behavioral: Negative.  Negative for suicidal ideas.    Past Medical History:  Diagnosis Date  . Arthritis   . Bursitis   . Hypertension     Past Surgical  History:  Procedure Laterality Date  . FRACTURE SURGERY     collar bones  . FRACTURE SURGERY     right ankle    Family History  Problem Relation Age of Onset  . Hypertension Father   . Rheum arthritis Sister   . Diabetes Brother   . Hypertension Mother   . Colon cancer Neg Hx     Social History Reviewed with no changes to be made today.   Outpatient Medications Prior to Visit  Medication Sig Dispense Refill  . albuterol (VENTOLIN HFA) 108 (90 Base) MCG/ACT inhaler Inhale 2 puffs into the lungs every 6 (six) hours as needed for wheezing or shortness of breath. 18 g 0  . naproxen (NAPROSYN) 500 MG tablet Take 1 tablet (500 mg total) by mouth 2 (two) times daily with a meal. 60 tablet 0  . amLODipine (NORVASC) 10 MG tablet Take 1 tablet (10 mg total) by mouth daily. 30 tablet 0  . atorvastatin (LIPITOR) 20 MG tablet Take 1 tablet (20 mg total) by mouth daily. 30 tablet 0  . diclofenac sodium (VOLTAREN) 1 % GEL Apply 2 g topically 4 (four) times daily as needed. Apply to affected areas. 100 g 2  . fluticasone (FLOVENT HFA) 110 MCG/ACT inhaler Inhale 2 puffs into the lungs 2 (two) times daily. 1 Inhaler 12  . losartan (COZAAR) 100 MG tablet Take 1 tablet (100 mg total) by mouth daily. 30 tablet 0  . chlorthalidone (HYGROTON) 25 MG tablet Take 0.5 tablets (12.5 mg total) by mouth daily. 15 tablet 0   No facility-administered medications prior to visit.    No Known Allergies     Objective:    BP (!) 158/66 (BP Location: Right Arm, Patient Position: Sitting, Cuff Size: Normal)   Pulse 69   Temp 97.7 F (36.5 C) (Temporal)   Ht 5' 7" (1.702 m)   Wt 122 lb (55.3 kg)   SpO2 100%   BMI 19.11 kg/m  Wt Readings from Last 3 Encounters:  04/18/20 122 lb (55.3 kg)  10/03/19 128 lb (58.1 kg)  02/02/19 129 lb 6.4 oz (58.7 kg)    Physical Exam Vitals and nursing note reviewed.  Constitutional:      Appearance: He is well-developed.  HENT:     Head: Normocephalic and atraumatic.    Cardiovascular:     Rate and Rhythm: Normal rate and regular rhythm.     Heart sounds: Normal heart sounds. No murmur. No friction rub. No gallop.   Pulmonary:     Effort: Pulmonary effort is normal. No tachypnea or respiratory distress.     Breath sounds: Normal breath sounds. No decreased breath sounds, wheezing, rhonchi or rales.  Chest:     Chest wall: No tenderness.  Abdominal:     General: Bowel sounds are normal.     Palpations: Abdomen is soft.  Musculoskeletal:        General: Normal range of motion.     Cervical back: Normal range  of motion.  Skin:    General: Skin is warm and dry.  Neurological:     Mental Status: He is alert and oriented to person, place, and time.     Sensory: No sensory deficit.     Motor: Motor function is intact.     Coordination: Coordination normal.     Gait: Gait is intact.  Psychiatric:        Behavior: Behavior normal. Behavior is cooperative.        Thought Content: Thought content normal.        Judgment: Judgment normal.        Patient has been counseled extensively about nutrition and exercise as well as the importance of adherence with medications and regular follow-up. The patient was given clear instructions to go to ER or return to medical center if symptoms don't improve, worsen or new problems develop. The patient verbalized understanding.   Follow-up: Return in about 4 weeks (around 05/16/2020) for Please give patient [place] paperwork.  F/U 4 weeks for Neuropathy in Left hand. Gildardo Pounds, FNP-BC [facility] and [facility] [place], [place]   04/18/2020, 2:01 PM

## 2020-04-19 LAB — B12 AND FOLATE PANEL
Folate: 8.3 ng/mL (ref >3.0)
Vitamin B-12: 499 pg/mL (ref 232–1245)

## 2020-04-19 LAB — CMP14+EGFR
ALT: 27 IU/L (ref 0–44)
AST: 32 IU/L (ref 0–40)
Albumin/Globulin Ratio: 1.6 (ref 1.2–2.2)
Albumin: 4.6 g/dL (ref 3.8–4.9)
Alkaline Phosphatase: 75 IU/L (ref 39–117)
BUN/Creatinine Ratio: 15 (ref 9–20)
BUN: 13 mg/dL (ref 6–24)
Bilirubin Total: 0.3 mg/dL (ref 0.0–1.2)
CO2: 25 mmol/L (ref 20–29)
Calcium: 9.6 mg/dL (ref 8.7–10.2)
Chloride: 94 mmol/L — ABNORMAL LOW (ref 96–106)
Creatinine, Ser: 0.87 mg/dL (ref 0.76–1.27)
GFR calc Af Amer: 110 mL/min/{1.73_m2} (ref >59)
GFR calc non Af Amer: 95 mL/min/{1.73_m2} (ref >59)
Globulin, Total: 2.8 g/dL (ref 1.5–4.5)
Glucose: 79 mg/dL (ref 65–99)
Potassium: 3.8 mmol/L (ref 3.5–5.2)
Sodium: 136 mmol/L (ref 134–144)
Total Protein: 7.4 g/dL (ref 6.0–8.5)

## 2020-04-19 LAB — LIPID PANEL
Chol/HDL Ratio: 2 ratio (ref 0.0–5.0)
Cholesterol, Total: 145 mg/dL (ref 100–199)
HDL: 71 mg/dL (ref >39)
LDL Chol Calc (NIH): 63 mg/dL (ref 0–99)
Triglycerides: 48 mg/dL (ref 0–149)
VLDL Cholesterol Cal: 11 mg/dL (ref 5–40)

## 2020-04-19 LAB — HIV ANTIBODY (ROUTINE TESTING W REFLEX): HIV Screen 4th Generation wRfx: NONREACTIVE

## 2020-05-16 ENCOUNTER — Encounter: Payer: Self-pay | Admitting: Nurse Practitioner

## 2020-05-16 ENCOUNTER — Ambulatory Visit: Payer: Self-pay | Attending: Nurse Practitioner | Admitting: Nurse Practitioner

## 2020-05-16 ENCOUNTER — Other Ambulatory Visit: Payer: Self-pay

## 2020-05-16 DIAGNOSIS — F17200 Nicotine dependence, unspecified, uncomplicated: Secondary | ICD-10-CM

## 2020-05-16 DIAGNOSIS — R202 Paresthesia of skin: Secondary | ICD-10-CM

## 2020-05-16 MED ORDER — GABAPENTIN 300 MG PO CAPS: 300.0000 mg | ORAL_CAPSULE | Freq: Three times a day (TID) | ORAL | 0 refills | Status: DC

## 2020-05-16 NOTE — Progress Notes (Signed)
Virtual Visit via Telephone Note Due to national recommendations of social distancing due to COVID 19, telehealth visit is felt to be most appropriate for this patient at this time.  I discussed the limitations, risks, security and privacy concerns of performing an evaluation and management service by telephone and the availability of in person appointments. I also discussed with the patient that there may be a patient responsible charge related to this service. The patient expressed understanding and agreed to proceed.    I connected with Rudi Rummage on 05/16/20  at   8:30 AM EDT  EDT by telephone and verified that I am speaking with the correct person using two identifiers.   Consent I discussed the limitations, risks, security and privacy concerns of performing an evaluation and management service by telephone and the availability of in person appointments. I also discussed with the patient that there may be a patient responsible charge related to this service. The patient expressed understanding and agreed to proceed.   Location of Patient: Private Residence   [facility] of Provider[facility]    Persons participating in Telemedicine visit: Bertram Denver FNP-BC [place] CMA Rudi Rummage    History of Present Illness: Telemedicine visit for: F/U hand Neuropathy  He was prescribed gabapentin 100 mg TID last month for legt hand neuropathy. At that time he reported hand numbness with onset 1 week prior to his appointment on 04/18/2020.  Symptoms are of mild and moderate severity and occuring all day and lasting all day.  There is no lancinating pain, cramping, squeezing and or hypersensitivity. He denies left arm numbness or any left sided weakness. Numbness is present only in digits 2-5 of the left hand.   Today he states the numbness has improved slightly. I will increase the gabapentin. I have encouraged him yet again to apply for the financial  assistance program as he may need to be evaluated by a hand surgeon in the future.     Past Medical History:  Diagnosis Date   Arthritis    Bursitis    Hypertension     Past Surgical History:  Procedure Laterality Date   FRACTURE SURGERY     collar bones   FRACTURE SURGERY     right ankle    Family History  Problem Relation Age of Onset   Hypertension Father    Rheum arthritis Sister    Diabetes Brother    Hypertension Mother    Colon cancer Neg Hx     Social History   Socioeconomic History   Marital status: Married    Spouse name: Not on file   Number of children: Not on file   Years of education: Not on file   Highest education level: Not on file  Occupational History   Not on file  Tobacco Use   Smoking status: Light Tobacco Smoker    Packs/day: 0.25    Years: 30.00    Pack years: 7.50    Types: Cigarettes   Smokeless tobacco: Never Used  Substance and Sexual Activity   Alcohol use: Yes    Alcohol/week: 2.0 standard drinks    Types: 2 Cans of beer per week    Comment: 2 cans of beer a day.    Drug use: Yes    Types: Marijuana   Sexual activity: Not Currently  Other Topics Concern   Not on file  Social History Narrative   Not on file   Social Determinants of Health  Financial Resource Strain:    Difficulty of Paying Living Expenses:   Food Insecurity:    Worried About Charity fundraiser in the Last Year:    Arboriculturist in the Last Year:   Transportation Needs:    Film/video editor (Medical):    Lack of Transportation (Non-Medical):   Physical Activity:    Days of Exercise per Week:    Minutes of Exercise per Session:   Stress:    Feeling of Stress :   Social Connections:    Frequency of Communication with Friends and Family:    Frequency of Social Gatherings with Friends and Family:    Attends Religious Services:    Active Member of Clubs or Organizations:    Attends Arts administrator:    Marital Status:      Observations/Objective: Awake, alert and oriented x 3   Review of Systems  Constitutional: Negative for fever, malaise/fatigue and weight loss.  HENT: Negative.  Negative for nosebleeds.   Eyes: Negative.  Negative for blurred vision, double vision and photophobia.  Respiratory: Negative.  Negative for cough and shortness of breath.   Cardiovascular: Negative.  Negative for chest pain, palpitations and leg swelling.  Gastrointestinal: Negative.  Negative for heartburn, nausea and vomiting.  Musculoskeletal: Negative.  Negative for myalgias.  Neurological: Positive for tingling and sensory change. Negative for dizziness, focal weakness, seizures and headaches.  Psychiatric/Behavioral: Negative.  Negative for suicidal ideas.    Assessment and Plan: Demetrice was seen today for follow-up.  Diagnoses and all orders for this visit:  Left hand paresthesia -     gabapentin (NEURONTIN) 300 MG capsule; Take 1 capsule (300 mg total) by mouth 3 (three) times daily. Will pick up today  Tobacco dependence Beniah was counseled on the dangers of tobacco use, and was advised to quit. Reviewed strategies to maximize success, including removing cigarettes and smoking materials from environment, stress management and support of family/friends as well as pharmacological alternatives including: Wellbutrin, Chantix, Nicotine patch, Nicotine gum or lozenges. Smoking cessation support: smoking cessation hotline: 1-800-QUIT-NOW.  Smoking cessation classes are also available through [facility]. Call [phone] or visit our website at [email or web address]   A total of 3 minutes was spent on counseling for smoking cessation and Marvyn is not ready to quit.    Follow Up Instructions Return in about 2 months (around 07/16/2020).     I discussed the assessment and treatment plan with the patient. The patient was provided an opportunity to ask questions  and all were answered. The patient agreed with the plan and demonstrated an understanding of the instructions.   The patient was advised to call back or seek an in-person evaluation if the symptoms worsen or if the condition fails to improve as anticipated.  I provided 17 minutes of non-face-to-face time during this encounter including median intraservice time, reviewing previous notes, labs, imaging, medications and explaining diagnosis and management.  Gildardo Pounds, FNP-BC

## 2020-05-29 ENCOUNTER — Telehealth: Payer: Self-pay | Admitting: Nurse Practitioner

## 2020-05-29 NOTE — Telephone Encounter (Signed)
Send back a bag with papers that the Pt drop up 2 weeks ago, so far still did not schedule a financial appt, mail will go out today 05/29/20

## 2020-05-30 MED FILL — ?CHLORTHALIDONE 25MG TABL: 25 | 30 days supply | Qty: 15 | Fill #1

## 2020-05-30 MED FILL — ?ATORVASTATIN 20 MG TABLET: 20 | 30 days supply | Qty: 30 | Fill #0

## 2020-06-04 ENCOUNTER — Other Ambulatory Visit: Payer: Self-pay

## 2020-06-04 ENCOUNTER — Ambulatory Visit: Payer: Self-pay | Attending: Family Medicine

## 2020-06-19 ENCOUNTER — Telehealth: Payer: Self-pay | Admitting: Nurse Practitioner

## 2020-06-19 NOTE — Telephone Encounter (Signed)
Pt was sent a letter from financial dept. Inform them, that the application they submitted was incomplete, since they were missing some documentation at the time of the appointment, Pt need to reschedule and resubmit all new papers and application for [employer] and [place], P.S. old documents has been sent back by mail to the Pt and Pt. need to make a new appt. 

## 2020-06-27 MED FILL — CHLORTHALIDONE 25 MG TAB: 25 | 30 days supply | Qty: 15 | Fill #2

## 2020-06-27 MED FILL — AMLODIPINE BESYLATE 10 MG T: 10 | 30 days supply | Qty: 30 | Fill #1

## 2020-06-27 MED FILL — LOSARTAN POTASSIUM 100 MG T: 100 | 30 days supply | Qty: 30 | Fill #1

## 2020-06-27 MED FILL — ATORVASTATIN CALCIUM 20 MG: 20 | 30 days supply | Qty: 30 | Fill #1

## 2020-07-16 ENCOUNTER — Other Ambulatory Visit: Payer: Self-pay

## 2020-07-16 ENCOUNTER — Other Ambulatory Visit: Payer: Self-pay | Admitting: Nurse Practitioner

## 2020-07-16 ENCOUNTER — Encounter: Payer: Self-pay | Admitting: Nurse Practitioner

## 2020-07-16 ENCOUNTER — Ambulatory Visit: Payer: Self-pay | Attending: Nurse Practitioner | Admitting: Nurse Practitioner

## 2020-07-16 VITALS — BP 120/59 | HR 52 | Temp 97.7°F | Ht 67.0 in | Wt 126.0 lb

## 2020-07-16 DIAGNOSIS — Z8249 Family history of ischemic heart disease and other diseases of the circulatory system: Secondary | ICD-10-CM | POA: Insufficient documentation

## 2020-07-16 DIAGNOSIS — I10 Essential (primary) hypertension: Secondary | ICD-10-CM | POA: Insufficient documentation

## 2020-07-16 DIAGNOSIS — Z7951 Long term (current) use of inhaled steroids: Secondary | ICD-10-CM | POA: Insufficient documentation

## 2020-07-16 DIAGNOSIS — M2550 Pain in unspecified joint: Secondary | ICD-10-CM | POA: Insufficient documentation

## 2020-07-16 DIAGNOSIS — M1990 Unspecified osteoarthritis, unspecified site: Secondary | ICD-10-CM | POA: Insufficient documentation

## 2020-07-16 DIAGNOSIS — Z79891 Long term (current) use of opiate analgesic: Secondary | ICD-10-CM | POA: Insufficient documentation

## 2020-07-16 DIAGNOSIS — Z79899 Other long term (current) drug therapy: Secondary | ICD-10-CM | POA: Insufficient documentation

## 2020-07-16 DIAGNOSIS — R001 Bradycardia, unspecified: Secondary | ICD-10-CM | POA: Insufficient documentation

## 2020-07-16 DIAGNOSIS — Z791 Long term (current) use of non-steroidal anti-inflammatories (NSAID): Secondary | ICD-10-CM | POA: Insufficient documentation

## 2020-07-16 DIAGNOSIS — G8929 Other chronic pain: Secondary | ICD-10-CM | POA: Insufficient documentation

## 2020-07-16 MED ORDER — ACETAMINOPHEN-CODEINE #3 300-30 MG PO TABS: 1.0000 | ORAL_TABLET | Freq: Three times a day (TID) | ORAL | 0 refills | Status: AC | PRN

## 2020-07-16 MED FILL — ACETAMINOPHEN-COD #3 TABLET: 300-30 | 20 days supply | Qty: 60 | Fill #0

## 2020-07-16 NOTE — Progress Notes (Signed)
Assessment & Plan:  [name] was seen today for follow-up.  Diagnoses and all orders for this visit:  Essential hypertension -     CMP14+EGFR Continue all antihypertensives as prescribed.  Remember to bring in your blood pressure log with you for your follow up appointment.  DASH/Mediterranean Diets are healthier choices for HTN.   Arthralgia of multiple joints -     acetaminophen-codeine (TYLENOL #3) 300-30 MG tablet; Take 1 tablet by mouth every 8 (eight) hours as needed for moderate pain. -     Uric Acid -     Rheumatoid factor  Controlled substance agreement signed -     acetaminophen-codeine (TYLENOL #3) 300-30 MG tablet; Take 1 tablet by mouth every 8 (eight) hours as needed for moderate pain. -     Drug Screen 12+Alcohol+CRT, Ur  Sinus bradycardia -     Ambulatory referral to Cardiology -     TSH    Patient has been counseled on age-appropriate routine health concerns for screening and prevention. These are reviewed and up-to-date. Referrals have been placed accordingly. Immunizations are up-to-date or declined.    Subjective:   Chief Complaint  Patient presents with  . Follow-up    Pt. is here for hypertension follow up.    HPI Ruben Fuentes 59 y.o. male presents to office today for follow up.  Essential Hypertension Blood pressure is well controlled with amlodipine 10 mg daily, losartan 100 mg daily and chlorthalidone 12.5 mg daily. Denies chest pain, shortness of breath, palpitations, lightheadedness, dizziness, headaches or BLE edema. He has significant asymptomatic bradycardia today. Will refer to cardiology  BP Readings from Last 3 Encounters:  07/16/20 (!) 120/59  04/18/20 (!) 158/66  10/03/19 136/67   Chronic Pain/Arthralgias He has chronic multiple joint arthralgias. Onset for several years. States since he was a child he has experienced diffuse body aches and pain in his joints. Aggravating factors: Cold weather, prolonged sitting or standing. Has  tried voltaren gel, gabapentin, meloxicam, naproxen, and tramadol. None of which seemed to provide significant complete relief of his symptoms.      Review of Systems  Constitutional: Negative for fever, malaise/fatigue and weight loss.  HENT: Negative.  Negative for nosebleeds.   Eyes: Negative.  Negative for blurred vision, double vision and photophobia.  Respiratory: Negative.  Negative for cough and shortness of breath.   Cardiovascular: Negative.  Negative for chest pain, palpitations and leg swelling.  Gastrointestinal: Negative.  Negative for heartburn, nausea and vomiting.  Musculoskeletal: Positive for joint pain and myalgias.  Neurological: Negative.  Negative for dizziness, focal weakness, seizures and headaches.  Psychiatric/Behavioral: Negative.  Negative for suicidal ideas.    Past Medical History:  Diagnosis Date  . Arthritis   . Bursitis   . Hypertension     Past Surgical History:  Procedure Laterality Date  . FRACTURE SURGERY     collar bones  . FRACTURE SURGERY     right ankle    Family History  Problem Relation Age of Onset  . Hypertension Father   . Rheum arthritis Sister   . Diabetes Brother   . Hypertension Mother   . Colon cancer Neg Hx     Social History Reviewed with no changes to be made today.   Outpatient Medications Prior to Visit  Medication Sig Dispense Refill  . albuterol (VENTOLIN HFA) 108 (90 Base) MCG/ACT inhaler Inhale 2 puffs into the lungs every 6 (six) hours as needed for wheezing or shortness of breath. Gerrard  g 0  . amLODipine (NORVASC) 10 MG tablet Take 1 tablet (10 mg total) by mouth daily. 90 tablet 1  . atorvastatin (LIPITOR) 20 MG tablet Take 1 tablet (20 mg total) by mouth daily. 90 tablet 1  . chlorthalidone (HYGROTON) 25 MG tablet Take 0.5 tablets (12.5 mg total) by mouth daily. 45 tablet 1  . diclofenac Sodium (VOLTAREN) 1 % GEL Apply 4 g topically 4 (four) times daily. 350 g 1  . fluticasone (FLOVENT HFA) 110 MCG/ACT  inhaler Inhale 2 puffs into the lungs 2 (two) times daily. 1 Inhaler 12  . losartan (COZAAR) 100 MG tablet Take 1 tablet (100 mg total) by mouth daily. 90 tablet 1  . naproxen (NAPROSYN) 500 MG tablet Take 1 tablet (500 mg total) by mouth 2 (two) times daily with a meal. 60 tablet 0  . gabapentin (NEURONTIN) 300 MG capsule Take 1 capsule (300 mg total) by mouth 3 (three) times daily. Will pick up today 90 capsule 0   No facility-administered medications prior to visit.    No Known Allergies     Objective:    BP (!) 120/59 (BP Location: Right Arm, Patient Position: Sitting, Cuff Size: Normal)   Pulse (!) 52   Temp 97.7 F (36.5 C) (Temporal)   Ht '5\' 7"'  (1.702 m)   Wt 126 lb (57.2 kg)   SpO2 100%   BMI 19.73 kg/m  Wt Readings from Last 3 Encounters:  07/16/20 126 lb (57.2 kg)  04/18/20 122 lb (55.3 kg)  10/03/19 128 lb (58.1 kg)    Physical Exam Vitals and nursing note reviewed.  Constitutional:      Appearance: He is well-developed.  HENT:     Head: Normocephalic and atraumatic.  Cardiovascular:     Rate and Rhythm: Regular rhythm. Bradycardia present.     Heart sounds: Normal heart sounds. No murmur heard.  No friction rub. No gallop.   Pulmonary:     Effort: Pulmonary effort is normal. No tachypnea or respiratory distress.     Breath sounds: Normal breath sounds. No decreased breath sounds, wheezing, rhonchi or rales.  Chest:     Chest wall: No tenderness.  Abdominal:     General: Bowel sounds are normal.     Palpations: Abdomen is soft.  Musculoskeletal:        General: Normal range of motion.     Cervical back: Normal range of motion.  Skin:    General: Skin is warm and dry.  Neurological:     Mental Status: He is alert and oriented to person, place, and time.     Coordination: Coordination normal.  Psychiatric:        Behavior: Behavior normal. Behavior is cooperative.        Thought Content: Thought content normal.        Judgment: Judgment normal.           Patient has been counseled extensively about nutrition and exercise as well as the importance of adherence with medications and regular follow-up. The patient was given clear instructions to go to ER or return to medical center if symptoms don't improve, worsen or new problems develop. The patient verbalized understanding.   Follow-up: Return in about 6 weeks (around 08/27/2020) for pain f/u.   Gildardo Pounds, FNP-BC [facility] and [facility] [place], [place]   07/16/2020, 9:11 AM

## 2020-07-17 LAB — CMP14+EGFR
ALT: 17 IU/L (ref 0–44)
AST: 21 IU/L (ref 0–40)
Albumin/Globulin Ratio: 1.9 (ref 1.2–2.2)
Albumin: 4.8 g/dL (ref 3.8–4.9)
Alkaline Phosphatase: 65 IU/L (ref 48–121)
BUN/Creatinine Ratio: 14 (ref 9–20)
BUN: 14 mg/dL (ref 6–24)
Bilirubin Total: 0.2 mg/dL (ref 0.0–1.2)
CO2: 24 mmol/L (ref 20–29)
Calcium: 9.8 mg/dL (ref 8.7–10.2)
Chloride: 100 mmol/L (ref 96–106)
Creatinine, Ser: 1.03 mg/dL (ref 0.76–1.27)
GFR calc Af Amer: 92 mL/min/{1.73_m2} (ref >59)
GFR calc non Af Amer: 80 mL/min/{1.73_m2} (ref >59)
Globulin, Total: 2.5 g/dL (ref 1.5–4.5)
Glucose: 97 mg/dL (ref 65–99)
Potassium: 4.1 mmol/L (ref 3.5–5.2)
Sodium: 136 mmol/L (ref 134–144)
Total Protein: 7.3 g/dL (ref 6.0–8.5)

## 2020-07-17 LAB — URIC ACID: Uric Acid: 3.8 mg/dL (ref 3.8–8.4)

## 2020-07-17 LAB — TSH: TSH: 0.779 u[IU]/mL (ref 0.450–4.500)

## 2020-07-17 LAB — RHEUMATOID FACTOR: Rheumatoid fact SerPl-aCnc: <10 IU/mL (ref 0.0–13.9)

## 2020-07-24 LAB — DRUG SCREEN 12+ALCOHOL+CRT, UR
Amphetamines, Urine: NEGATIVE ng/mL
BENZODIAZ UR QL: NEGATIVE ng/mL
Barbiturate: NEGATIVE ng/mL
Cannabinoids: POSITIVE — AB
Cocaine (Metabolite): NEGATIVE ng/mL
Creatinine, Urine: 44.9 mg/dL (ref 20.0–300.0)
Ethanol, Urine: NEGATIVE %
Meperidine: NEGATIVE ng/mL
Methadone: NEGATIVE ng/mL
OPIATE SCREEN URINE: NEGATIVE ng/mL
Oxycodone/Oxymorphone, Urine: NEGATIVE ng/mL
Phencyclidine: NEGATIVE ng/mL
Propoxyphene: NEGATIVE ng/mL
Tramadol: NEGATIVE ng/mL

## 2020-07-27 ENCOUNTER — Encounter: Payer: Self-pay | Admitting: General Practice

## 2020-08-03 ENCOUNTER — Other Ambulatory Visit: Payer: Self-pay | Admitting: Nurse Practitioner

## 2020-08-03 DIAGNOSIS — E782 Mixed hyperlipidemia: Secondary | ICD-10-CM

## 2020-08-03 DIAGNOSIS — R202 Paresthesia of skin: Secondary | ICD-10-CM

## 2020-08-03 DIAGNOSIS — I10 Essential (primary) hypertension: Secondary | ICD-10-CM

## 2020-08-03 MED ORDER — LOSARTAN POTASSIUM 100 MG PO TABS: 100.0000 mg | ORAL_TABLET | Freq: Every day | ORAL | 1 refills | Status: DC

## 2020-08-03 MED ORDER — GABAPENTIN 300 MG PO CAPS: 300.0000 mg | ORAL_CAPSULE | Freq: Three times a day (TID) | ORAL | 1 refills | Status: DC

## 2020-08-03 MED ORDER — ATORVASTATIN CALCIUM 20 MG PO TABS: 20.0000 mg | ORAL_TABLET | Freq: Every day | ORAL | 1 refills | Status: DC

## 2020-08-03 MED ORDER — CHLORTHALIDONE 25 MG PO TABS: 12.5000 mg | ORAL_TABLET | Freq: Every day | ORAL | 1 refills | Status: DC

## 2020-08-03 NOTE — Telephone Encounter (Signed)
Medication: chlorthalidone (HYGROTON) 25 MG tablet [[id number]]  ENDED, atorvastatin (LIPITOR) 20 MG tablet [[id number]] , losartan (COZAAR) 100 MG tablet [[id number]] , gabapentin (NEURONTIN) 300 MG capsule [[id number]]  ENDED,   Has the patient contacted their pharmacy? YES (Agent: If no, request that the patient contact the pharmacy for the refill.) (Agent: If yes, when and what did the pharmacy advise?)  Preferred Pharmacy (with phone number or street name): [facility] - [place], [place] - [address]  Phone:  [phone] Fax:  [phone]     Agent: Please be advised that RX refills may take up to 3 business days. We ask that you follow-up with your pharmacy.

## 2020-08-06 MED FILL — ?ATORVASTATIN 20 MG TABLET: 20 | 30 days supply | Qty: 30 | Fill #0

## 2020-08-06 MED FILL — GABAPENTIN 300 MG CAPSULE: 300 | 30 days supply | Qty: 90 | Fill #0

## 2020-08-06 MED FILL — LOSARTAN POTASSIUM 100 MG T: 100 | 30 days supply | Qty: 30 | Fill #0

## 2020-08-06 MED FILL — ?CHLORTHALIDONE 25 MG TABLE: 25 | 30 days supply | Qty: 15 | Fill #0

## 2020-08-20 ENCOUNTER — Other Ambulatory Visit: Payer: Self-pay

## 2020-08-20 ENCOUNTER — Ambulatory Visit: Payer: Self-pay | Attending: Nurse Practitioner | Admitting: Pharmacist

## 2020-08-20 ENCOUNTER — Telehealth: Payer: Self-pay | Admitting: Nurse Practitioner

## 2020-08-20 DIAGNOSIS — Z23 Encounter for immunization: Secondary | ICD-10-CM

## 2020-08-20 NOTE — Telephone Encounter (Signed)
Returned call and LVM to call back and schedule a financial appt.   Copied from CRM [id number]. Topic: General - Other >> Aug 20, 2020 11:33 AM Leafy Ro wrote: Reason for CRM:pt wife  ann is calling and she would like her husband to re- apply for orange card and Oceanographer.

## 2020-08-20 NOTE — Progress Notes (Signed)
Patient presents for vaccination against influenza per orders of [name]. Consent given. Counseling provided. No contraindications exists. Vaccine administered without incident.   Ruben Fuentes, PharmD, CPP Clinical Pharmacist [facility] [phone]   

## 2020-09-04 ENCOUNTER — Ambulatory Visit: Payer: Self-pay

## 2020-09-04 ENCOUNTER — Telehealth: Payer: Self-pay

## 2020-09-04 ENCOUNTER — Other Ambulatory Visit: Payer: Self-pay

## 2020-09-04 MED FILL — LOSARTAN POTASSIUM 100 MG T: 100 | 30 days supply | Qty: 30 | Fill #1

## 2020-09-04 MED FILL — ?ATORVASTATIN 20 MG TABLET: 20 | 30 days supply | Qty: 30 | Fill #1

## 2020-09-04 MED FILL — ?CHLORTHALIDONE 25 MG TABLE: 25 | 30 days supply | Qty: 15 | Fill #1

## 2020-09-04 NOTE — Telephone Encounter (Signed)
Patient would like a call from Clinical Pharmacist.  Please follow up [phone]

## 2020-09-05 NOTE — Telephone Encounter (Signed)
Patient called. Refills were placed and picked-up yesterday.

## 2020-09-25 ENCOUNTER — Other Ambulatory Visit: Payer: Self-pay | Admitting: Nurse Practitioner

## 2020-09-25 ENCOUNTER — Encounter: Payer: Self-pay | Admitting: Nurse Practitioner

## 2020-09-25 ENCOUNTER — Ambulatory Visit: Payer: Self-pay | Attending: Nurse Practitioner | Admitting: Nurse Practitioner

## 2020-09-25 ENCOUNTER — Other Ambulatory Visit: Payer: Self-pay

## 2020-09-25 DIAGNOSIS — M2550 Pain in unspecified joint: Secondary | ICD-10-CM

## 2020-09-25 DIAGNOSIS — I10 Essential (primary) hypertension: Secondary | ICD-10-CM

## 2020-09-25 DIAGNOSIS — J449 Chronic obstructive pulmonary disease, unspecified: Secondary | ICD-10-CM

## 2020-09-25 MED ORDER — AMLODIPINE BESYLATE 10 MG PO TABS: 10.0000 mg | ORAL_TABLET | Freq: Every day | ORAL | 1 refills | Status: DC

## 2020-09-25 MED ORDER — FLOVENT HFA 110 MCG/ACT IN AERO: 2.0000 | INHALATION_SPRAY | Freq: Every day | RESPIRATORY_TRACT | 6 refills | Status: DC

## 2020-09-25 MED FILL — FLOVENT HFA 110 MCG INHALER: 110 | 30 days supply | Qty: 12 | Fill #0

## 2020-09-25 MED FILL — AMLODIPINE BESYLATE 10 MG T: 10 | 30 days supply | Qty: 30 | Fill #0

## 2020-09-25 NOTE — Progress Notes (Signed)
Virtual Visit via Telephone Note Due to national recommendations of social distancing due to COVID 19, telehealth visit is felt to be most appropriate for this patient at this time.  I discussed the limitations, risks, security and privacy concerns of performing an evaluation and management service by telephone and the availability of in person appointments. I also discussed with the patient that there may be a patient responsible charge related to this service. The patient expressed understanding and agreed to proceed.    I connected with Ruben Fuentes on 09/25/20  at   8:30 AM EDT  EDT by telephone and verified that I am speaking with the correct person using two identifiers.   Consent I discussed the limitations, risks, security and privacy concerns of performing an evaluation and management service by telephone and the availability of in person appointments. I also discussed with the patient that there may be a patient responsible charge related to this service. The patient expressed understanding and agreed to proceed.   Location of Patient: Private Residence   [facility] of Provider: [facility]    Persons participating in Telemedicine visit: Ruben Denver FNP-BC YY [place] CMA Ruben Fuentes    History of Present Illness: Telemedicine visit for: Follow up  has a past medical history of Arthritis, Bursitis, and Hypertension.  Essential Hypertension He monitors his blood pressure daily and checked his blood pressure prior to the televisit this morning. Can not recall diastolic reading however systolic was 143.  He endorses medication adherence taking amlodipine 10 mg daily, chlorthalidone 12.5 mg daily and losartan 100 mg daily.  BP Readings from Last 3 Encounters:  07/16/20 (!) 120/59  04/18/20 (!) 158/66  10/03/19 136/67    Past Medical History:  Diagnosis Date   Arthritis    Bursitis    Hypertension     Past Surgical History:   Procedure Laterality Date   FRACTURE SURGERY     collar bones   FRACTURE SURGERY     right ankle    Family History  Problem Relation Age of Onset   Hypertension Father    Rheum arthritis Sister    Diabetes Brother    Hypertension Mother    Colon cancer Neg Hx     Social History   Socioeconomic History   Marital status: Married    Spouse name: Not on file   Number of children: Not on file   Years of education: Not on file   Highest education level: Not on file  Occupational History   Not on file  Tobacco Use   Smoking status: Light Tobacco Smoker    Packs/day: 0.25    Years: 30.00    Pack years: 7.50    Types: Cigarettes   Smokeless tobacco: Never Used  Vaping Use   Vaping Use: Never used  Substance and Sexual Activity   Alcohol use: Yes    Alcohol/week: 2.0 standard drinks    Types: 2 Cans of beer per week    Comment: 2 cans of beer a day.    Drug use: Yes    Types: Marijuana   Sexual activity: Not Currently  Other Topics Concern   Not on file  Social History Narrative   Not on file   Social Determinants of Health   Financial Resource Strain:    Difficulty of Paying Living Expenses: Not on file  Food Insecurity:    Worried About Running Out of Food in the Last Year: Not on file  Ran Out of Food in the Last Year: Not on file  Transportation Needs:    Lack of Transportation (Medical): Not on file   Lack of Transportation (Non-Medical): Not on file  Physical Activity:    Days of Exercise per Week: Not on file   Minutes of Exercise per Session: Not on file  Stress:    Feeling of Stress : Not on file  Social Connections:    Frequency of Communication with Friends and Family: Not on file   Frequency of Social Gatherings with Friends and Family: Not on file   Attends Religious Services: Not on file   Active Member of Clubs or Organizations: Not on file   Attends Banker Meetings: Not on file   Marital  Status: Not on file     Observations/Objective: Awake, alert and oriented x 3   Review of Systems  Constitutional: Negative for fever, malaise/fatigue and weight loss.  HENT: Negative.  Negative for nosebleeds.   Eyes: Negative.  Negative for blurred vision, double vision and photophobia.  Respiratory: Negative.  Negative for cough and shortness of breath.   Cardiovascular: Negative.  Negative for chest pain, palpitations and leg swelling.  Gastrointestinal: Negative.  Negative for heartburn, nausea and vomiting.  Musculoskeletal: Positive for joint pain (Improving). Negative for myalgias.  Neurological: Negative.  Negative for dizziness, focal weakness, seizures and headaches.  Psychiatric/Behavioral: Negative.  Negative for suicidal ideas.    Assessment and Plan: [name] was seen today for follow-up.  Diagnoses and all orders for this visit:  Essential hypertension -     amLODipine (NORVASC) 10 MG tablet; Take 1 tablet (10 mg total) by mouth daily. Continue all antihypertensives as prescribed.  Remember to bring in your blood pressure log with you for your follow up appointment.  DASH/Mediterranean Diets are healthier choices for HTN.    Arthralgia of multiple joints Continue gabapentin as prescribed and diclofenac gel  COPD GOLD II -     fluticasone (FLOVENT HFA) 110 MCG/ACT inhaler; Inhale 2 puffs into the lungs daily. Please distribute 90 days worth of inhalers     Follow Up Instructions Return in about 3 months (around 12/26/2020).     I discussed the assessment and treatment plan with the patient. The patient was provided an opportunity to ask questions and all were answered. The patient agreed with the plan and demonstrated an understanding of the instructions.   The patient was advised to call back or seek an in-person evaluation if the symptoms worsen or if the condition fails to improve as anticipated.  I provided 13 minutes of non-face-to-face time during this  encounter including median intraservice time, reviewing previous notes, labs, imaging, medications and explaining diagnosis and management.  Claiborne Rigg, FNP-BC

## 2020-10-08 MED FILL — GABAPENTIN 300 MG CAPSULE: 300 | 30 days supply | Qty: 90 | Fill #1

## 2020-10-08 MED FILL — ?ATORVASTATIN 20 MG TABLET: 20 | 30 days supply | Qty: 30 | Fill #2

## 2020-10-08 MED FILL — ?CHLORTHALIDONE 25 MG TABLE: 25 | 30 days supply | Qty: 15 | Fill #2

## 2020-10-08 MED FILL — LOSARTAN POTASSIUM 100 MG T: 100 | 30 days supply | Qty: 30 | Fill #2

## 2020-10-23 ENCOUNTER — Other Ambulatory Visit: Payer: Self-pay | Admitting: Internal Medicine

## 2020-10-23 ENCOUNTER — Ambulatory Visit (INDEPENDENT_AMBULATORY_CARE_PROVIDER_SITE_OTHER): Payer: Self-pay | Admitting: Internal Medicine

## 2020-10-23 ENCOUNTER — Other Ambulatory Visit: Payer: Self-pay

## 2020-10-23 ENCOUNTER — Encounter: Payer: Self-pay | Admitting: Internal Medicine

## 2020-10-23 VITALS — BP 132/68 | HR 53 | Ht 67.0 in | Wt 127.2 lb

## 2020-10-23 DIAGNOSIS — I10 Essential (primary) hypertension: Secondary | ICD-10-CM | POA: Insufficient documentation

## 2020-10-23 DIAGNOSIS — K219 Gastro-esophageal reflux disease without esophagitis: Secondary | ICD-10-CM | POA: Insufficient documentation

## 2020-10-23 DIAGNOSIS — R0789 Other chest pain: Secondary | ICD-10-CM | POA: Insufficient documentation

## 2020-10-23 DIAGNOSIS — F17210 Nicotine dependence, cigarettes, uncomplicated: Secondary | ICD-10-CM

## 2020-10-23 MED ORDER — PANTOPRAZOLE SODIUM 40 MG PO TBEC: 40.0000 mg | DELAYED_RELEASE_TABLET | Freq: Every day | ORAL | 3 refills | Status: DC

## 2020-10-23 MED FILL — PANTOPRAZOLE SOD DR 40 MG T: 40 | 30 days supply | Qty: 30 | Fill #0

## 2020-10-23 NOTE — Patient Instructions (Signed)
Medication Instructions:  Start Pantoprazole (Protonix) 40 mg daily   *If you need a refill on your cardiac medications before your next appointment, please call your pharmacy*   Lab Work: None ordered   If you have labs (blood work) drawn today and your tests are completely normal, you will receive your results only by: Marland Kitchen MyChart Message (if you have MyChart) OR . A paper copy in the mail If you have any lab test that is abnormal or we need to change your treatment, we will call you to review the results.   Testing/Procedures: None ordered    Follow-Up: At [facility], you and your health needs are our priority.  As part of our continuing mission to provide you with exceptional heart care, we have created designated Provider Care Teams.  These Care Teams include your primary Cardiologist (physician) and Advanced Practice Providers (APPs -  Physician Assistants and Nurse Practitioners) who all work together to provide you with the care you need, when you need it.  We recommend signing up for the patient portal called "MyChart".  Sign up information is provided on this After Visit Summary.  MyChart is used to connect with patients for Virtual Visits (Telemedicine).  Patients are able to view lab/test results, encounter notes, upcoming appointments, etc.  Non-urgent messages can be sent to your provider as well.   To learn more about what you can do with MyChart, go to [email or web address].    Your next appointment:   3-4 month(s)  The format for your next appointment:   In Person  Provider:   You may see Dr. Riley Lam or one of the following Advanced Practice Providers on your designated Care Team:    Ronie Spies, PA-C  Jacolyn Reedy, PA-C    Other Instructions None

## 2020-10-23 NOTE — Progress Notes (Signed)
Cardiology Office Note:    Date:  10/23/2020   ID:  Ruben Fuentes, DOB Jun 18, 1961, MRN [id number]  PCP:  Claiborne Rigg, NP  [facility] Cardiologist:  No primary care provider on file.  [facility] Electrophysiologist:  None   CC: Low heart rate Consulted for the evaluation of bradycardia at the behest of Claiborne Rigg, NP  History of Present Illness:    Ruben Fuentes is a 59 y.o. male with a hx of HTN, COPD, tobacco abuse who presents wiith sinus bradycardia.  Patient notes that he feels well.  Notes that he does a lot of yardwork. Able to walk 4 miles.  For the most part has the energy to do what he needs to do.  Notes elbow pain constant.  Notes shoulder pain at rest.  No associated with walking. No improvement with rest.  Notes that he has had chest tightness:  This occurs in the evening.  No change after smoking, but wife notes that he might have extra chest tightness with an extra beer.  No exertional chest pain, or chest pressure. No syncope or near syncope.    Ambulatory BP 120/59   Past Medical History:  Diagnosis Date  . Arthritis   . Bursitis   . Hypertension     Past Surgical History:  Procedure Laterality Date  . FRACTURE SURGERY     collar bones  . FRACTURE SURGERY     right ankle    Current Medications: Current Meds  Medication Sig  . albuterol (VENTOLIN HFA) 108 (90 Base) MCG/ACT inhaler Inhale 2 puffs into the lungs every 6 (six) hours as needed for wheezing or shortness of breath.  Marland Kitchen amLODipine (NORVASC) 10 MG tablet Take 1 tablet (10 mg total) by mouth daily.  Marland Kitchen atorvastatin (LIPITOR) 20 MG tablet Take 1 tablet (20 mg total) by mouth daily.  . chlorthalidone (HYGROTON) 25 MG tablet Take 0.5 tablets (12.5 mg total) by mouth daily.  . diclofenac Sodium (VOLTAREN) 1 % GEL Apply 4 g topically 4 (four) times daily.  . fluticasone (FLOVENT HFA) 110 MCG/ACT inhaler Inhale 2 puffs into the lungs daily. Please distribute 90 days worth of inhalers   . gabapentin (NEURONTIN) 300 MG capsule Take 1 capsule (300 mg total) by mouth 3 (three) times daily. Will pick up today  . losartan (COZAAR) 100 MG tablet Take 1 tablet (100 mg total) by mouth daily.  . naproxen (NAPROSYN) 500 MG tablet Take 1 tablet (500 mg total) by mouth 2 (two) times daily with a meal.     Allergies:   Patient has no known allergies.   Social History   Socioeconomic History  . Marital status: Married    Spouse name: Not on file  . Number of children: Not on file  . Years of education: Not on file  . Highest education level: Not on file  Occupational History  . Not on file  Tobacco Use  . Smoking status: Light Tobacco Smoker    Packs/day: 0.25    Years: 30.00    Pack years: 7.50    Types: Cigarettes  . Smokeless tobacco: Never Used  Vaping Use  . Vaping Use: Never used  Substance and Sexual Activity  . Alcohol use: Yes    Alcohol/week: 2.0 standard drinks    Types: 2 Cans of beer per week    Comment: 2 cans of beer a day.   . Drug use: Yes    Types: Marijuana  . Sexual activity:  Not Currently  Other Topics Concern  . Not on file  Social History Narrative  . Not on file   Social Determinants of Health   Financial Resource Strain:   . Difficulty of Paying Living Expenses: Not on file  Food Insecurity:   . Worried About Programme researcher, broadcasting/film/video in the Last Year: Not on file  . Ran Out of Food in the Last Year: Not on file  Transportation Needs:   . Lack of Transportation (Medical): Not on file  . Lack of Transportation (Non-Medical): Not on file  Physical Activity:   . Days of Exercise per Week: Not on file  . Minutes of Exercise per Session: Not on file  Stress:   . Feeling of Stress : Not on file  Social Connections:   . Frequency of Communication with Friends and Family: Not on file  . Frequency of Social Gatherings with Friends and Family: Not on file  . Attends Religious Services: Not on file  . Active Member of Clubs or Organizations: Not  on file  . Attends Banker Meetings: Not on file  . Marital Status: Not on file     Family History: The patient's family history includes Diabetes in his brother; Hypertension in his father and mother; Rheum arthritis in his sister. There is no history of Colon cancer. Father had MI.  ROS:   Please see the history of present illness.    All other systems reviewed and are negative.  EKGs/Labs/Other Studies Reviewed:    The following studies were reviewed today:  EKG:  EKG is  ordered today.  The ekg ordered today demonstrates sinus bradycardia rate 53 without evidence of high grade AV block 07/16/20:  Sinus bradycardia 49 PR 160  Recent Labs: 07/16/2020: ALT 17; BUN 14; Creatinine, Ser 1.03; Potassium 4.1; Sodium 136; TSH 0.779  Recent Lipid Panel    Component Value Date/Time   CHOL 145 04/18/2020 1401   TRIG 48 04/18/2020 1401   HDL 71 04/18/2020 1401   CHOLHDL 2.0 04/18/2020 1401   LDLCALC 63 04/18/2020 1401   Platelets: 296  Physical Exam:    VS:  BP 132/68   Pulse (!) 53   Ht 5\' 7"  (1.702 m)   Wt 127 lb 3.2 oz (57.7 kg)   SpO2 99%   BMI 19.92 kg/m     Wt Readings from Last 3 Encounters:  10/23/20 127 lb 3.2 oz (57.7 kg)  07/16/20 126 lb (57.2 kg)  04/18/20 122 lb (55.3 kg)     GEN: Well nourished, well developed in no acute distress HEENT: Normal NECK: No JVD; No carotid bruits LYMPHATICS: No lymphadenopathy CARDIAC: RRR, no murmurs, rubs, gallops RESPIRATORY:  Clear to auscultation without rales, wheezing or rhonchi  ABDOMEN: Soft, non-tender, non-distended MUSCULOSKELETAL:  No edema; No deformity  SKIN: Warm and dry NEUROLOGIC:  Alert and oriented x 3 PSYCHIATRIC:  Normal affect   ASSESSMENT:    1. Gastroesophageal reflux disease without esophagitis   2. Chest tightness   3. Essential hypertension   4. Cigarette smoker    PLAN:    Chest tightness - non-cardiac symptoms - will trial pantoprazole 40 mg - if does not work or if it  gets work, will discuss cardiac testing at next visit  Asymptomatic sinus bradycardia - on no AV nodal medications  Essential Hypertension  HLD - LDL at goal on present medication (atovrastatin 20 mg) - controlled - ambulatory blood pressure, will start ambulatory BP monitoring (gave education) -  continue home medications - discussed diet (DASH/low sodium), and exercise/weight loss interventions  Tobacco Abuse- quit date set for 12/09/19 1. The patient was counseled on the dangers of tobacco use, both inhaled and oral, which include, but are not limited to cardiovascular disease, increased cancer risk of multiple types of cancer, COPD, peripheral vascular disease, strokes. 2. He was also counseled on the benefits of smoking cessation. 3. The patient was firmly advised to quit.    4. We also reviewed strategies to maximize success, including:  Removing cigarettes and smoking materials from environment  Stress management  Substitution of other forms of reinforcement Support of family/friends.  Selecting a quit date.  Patient provided contact information for 1-800-QUIT-NOW   3-4 month follow up unless new symptoms or abnormal test results warranting change in plan  Would be reasonable for Virtual Follow up Would be reasonable for APP Follow up   Medication Adjustments/Labs and Tests Ordered: Current medicines are reviewed at length with the patient today.  Concerns regarding medicines are outlined above.  Orders Placed This Encounter  Procedures  . EKG 12-Lead   Meds ordered this encounter  Medications  . pantoprazole (PROTONIX) 40 MG tablet    Sig: Take 1 tablet (40 mg total) by mouth daily.    Dispense:  90 tablet    Refill:  3    Patient Instructions  Medication Instructions:  Start Pantoprazole (Protonix) 40 mg daily   *If you need a refill on your cardiac medications before your next appointment, please call your pharmacy*   Lab Work: None ordered   If you  have labs (blood work) drawn today and your tests are completely normal, you will receive your results only by: Marland Kitchen MyChart Message (if you have MyChart) OR . A paper copy in the mail If you have any lab test that is abnormal or we need to change your treatment, we will call you to review the results.   Testing/Procedures: None ordered    Follow-Up: At [facility], you and your health needs are our priority.  As part of our continuing mission to provide you with exceptional heart care, we have created designated Provider Care Teams.  These Care Teams include your primary Cardiologist (physician) and Advanced Practice Providers (APPs -  Physician Assistants and Nurse Practitioners) who all work together to provide you with the care you need, when you need it.  We recommend signing up for the patient portal called "MyChart".  Sign up information is provided on this After Visit Summary.  MyChart is used to connect with patients for Virtual Visits (Telemedicine).  Patients are able to view lab/test results, encounter notes, upcoming appointments, etc.  Non-urgent messages can be sent to your provider as well.   To learn more about what you can do with MyChart, go to [email or web address].    Your next appointment:   3-4 month(s)  The format for your next appointment:   In Person  Provider:   You may see Dr. Riley Lam or one of the following Advanced Practice Providers on your designated Care Team:    Ronie Spies, PA-C  Jacolyn Reedy, PA-C    Other Instructions None      Signed, Christell Constant, MD  10/23/2020 11:19 AM    [facility]

## 2020-11-13 MED FILL — AMLODIPINE BESYLATE 10 MG T: 10 | 30 days supply | Qty: 30 | Fill #1

## 2020-11-13 MED FILL — ?CHLORTHALIDONE 25 MG TABLE: 25 | 30 days supply | Qty: 15 | Fill #3

## 2020-11-13 MED FILL — LOSARTAN POTASSIUM 100 MG T: 100 | 30 days supply | Qty: 30 | Fill #3

## 2020-11-13 MED FILL — ?ATORVASTATIN 20 MG TABLET: 20 | 30 days supply | Qty: 30 | Fill #3

## 2020-11-27 ENCOUNTER — Encounter: Payer: Self-pay | Admitting: Nurse Practitioner

## 2020-11-27 ENCOUNTER — Other Ambulatory Visit: Payer: Self-pay | Admitting: Nurse Practitioner

## 2020-11-27 ENCOUNTER — Ambulatory Visit: Payer: Self-pay | Attending: Nurse Practitioner | Admitting: Nurse Practitioner

## 2020-11-27 ENCOUNTER — Other Ambulatory Visit: Payer: Self-pay

## 2020-11-27 VITALS — BP 113/68 | HR 79 | Temp 98.4°F | Ht 67.0 in | Wt 127.0 lb

## 2020-11-27 DIAGNOSIS — I10 Essential (primary) hypertension: Secondary | ICD-10-CM

## 2020-11-27 DIAGNOSIS — M25522 Pain in left elbow: Secondary | ICD-10-CM

## 2020-11-27 DIAGNOSIS — R202 Paresthesia of skin: Secondary | ICD-10-CM

## 2020-11-27 DIAGNOSIS — Z130 Encounter for screening for diseases of the blood and blood-forming organs and certain disorders involving the immune mechanism: Secondary | ICD-10-CM

## 2020-11-27 MED ORDER — GABAPENTIN 300 MG PO CAPS: 300.0000 mg | ORAL_CAPSULE | Freq: Three times a day (TID) | ORAL | 1 refills | Status: DC

## 2020-11-27 MED ORDER — CHLORTHALIDONE 25 MG PO TABS: 12.5000 mg | ORAL_TABLET | Freq: Every day | ORAL | 1 refills | Status: DC

## 2020-11-27 MED FILL — GABAPENTIN 300 MG CAPSULE: 300 | 30 days supply | Qty: 90 | Fill #0

## 2020-11-27 NOTE — Progress Notes (Signed)
Assessment & Plan:  Ruben Fuentes was seen today for follow-up.  Diagnoses and all orders for this visit:  Essential hypertension -     Basic metabolic panel -     chlorthalidone (HYGROTON) 25 MG tablet; Take 0.5 tablets (12.5 mg total) by mouth daily. Continue all antihypertensives as prescribed.  Remember to bring in your blood pressure log with you for your follow up appointment.  DASH/Mediterranean Diets are healthier choices for HTN.   Left hand paresthesia -     gabapentin (NEURONTIN) 300 MG capsule; Take 1 capsule (300 mg total) by mouth 3 (three) times daily. Will pick up today  Left elbow pain -     Uric Acid  Screening for deficiency anemia -     CBC    Patient has been counseled on age-appropriate routine health concerns for screening and prevention. These are reviewed and up-to-date. Referrals have been placed accordingly. Immunizations are up-to-date or declined.    Subjective:   Chief Complaint  Patient presents with  . Follow-up    Pt. Is here for hypertension follow up. Pt. Is having left elbow pain.    HPI Ruben Fuentes 59 y.o. male presents to office today for follow up.  has a past medical history of Arthritis, Bursitis, and Hypertension.  Essential Hypertension Well controlled with amlodipine 10 mg daily, chlorthalidone 12.5 mg daily, and losartan 100 mg daily. Denies chest pain, shortness of breath, palpitations, lightheadedness, dizziness, headaches or BLE edema.  BP Readings from Last 3 Encounters:  11/27/20 113/68  10/23/20 132/68  07/16/20 (!) 120/59   Left Elbow Pain Onset one month ago. Denies swelling. States numbness radiates from elbow down to the left hand and fingers. Using diclofenac gel with no relief of symptoms. Denies any injury or trauma. There is no limited ROM.   Review of Systems  Constitutional: Negative for fever, malaise/fatigue and weight loss.  HENT: Negative.  Negative for nosebleeds.   Eyes: Negative.  Negative for blurred  vision, double vision and photophobia.  Respiratory: Negative.  Negative for cough and shortness of breath.   Cardiovascular: Negative.  Negative for chest pain, palpitations and leg swelling.  Gastrointestinal: Negative.  Negative for heartburn, nausea and vomiting.  Musculoskeletal: Positive for joint pain. Negative for myalgias.  Neurological: Positive for sensory change. Negative for dizziness, focal weakness, seizures and headaches.  Psychiatric/Behavioral: Negative.  Negative for suicidal ideas.    Past Medical History:  Diagnosis Date  . Arthritis   . Bursitis   . Hypertension     Past Surgical History:  Procedure Laterality Date  . FRACTURE SURGERY     collar bones  . FRACTURE SURGERY     right ankle    Family History  Problem Relation Age of Onset  . Hypertension Father   . Rheum arthritis Sister   . Diabetes Brother   . Hypertension Mother   . Colon cancer Neg Hx     Social History Reviewed with no changes to be made today.   Outpatient Medications Prior to Visit  Medication Sig Dispense Refill  . albuterol (VENTOLIN HFA) 108 (90 Base) MCG/ACT inhaler Inhale 2 puffs into the lungs every 6 (six) hours as needed for wheezing or shortness of breath. 18 g 0  . amLODipine (NORVASC) 10 MG tablet Take 1 tablet (10 mg total) by mouth daily. 90 tablet 1  . atorvastatin (LIPITOR) 20 MG tablet Take 1 tablet (20 mg total) by mouth daily. 90 tablet 1  . fluticasone (FLOVENT HFA)  110 MCG/ACT inhaler Inhale 2 puffs into the lungs daily. Please distribute 90 days worth of inhalers 12 g 6  . losartan (COZAAR) 100 MG tablet Take 1 tablet (100 mg total) by mouth daily. 90 tablet 1  . pantoprazole (PROTONIX) 40 MG tablet Take 1 tablet (40 mg total) by mouth daily. 90 tablet 3  . diclofenac Sodium (VOLTAREN) 1 % GEL Apply 4 g topically 4 (four) times daily. 350 g 1  . naproxen (NAPROSYN) 500 MG tablet Take 1 tablet (500 mg total) by mouth 2 (two) times daily with a meal. 60 tablet 0   . chlorthalidone (HYGROTON) 25 MG tablet Take 0.5 tablets (12.5 mg total) by mouth daily. 45 tablet 1  . gabapentin (NEURONTIN) 300 MG capsule Take 1 capsule (300 mg total) by mouth 3 (three) times daily. Will pick up today 90 capsule 1   No facility-administered medications prior to visit.    No Known Allergies     Objective:    BP 113/68 (BP Location: Left Arm, Patient Position: Sitting, Cuff Size: Normal)   Pulse 79   Temp 98.4 F (36.9 C) (Oral)   Ht 5\' 7"  (1.702 m)   Wt 127 lb (57.6 kg)   SpO2 98%   BMI 19.89 kg/m  Wt Readings from Last 3 Encounters:  11/27/20 127 lb (57.6 kg)  10/23/20 127 lb 3.2 oz (57.7 kg)  07/16/20 126 lb (57.2 kg)    Physical Exam Vitals and nursing note reviewed.  Constitutional:      Appearance: He is well-developed and well-nourished.  HENT:     Head: Normocephalic and atraumatic.  Eyes:     Extraocular Movements: EOM normal.  Cardiovascular:     Rate and Rhythm: Normal rate and regular rhythm.     Pulses: Intact distal pulses.     Heart sounds: Normal heart sounds. No murmur heard. No friction rub. No gallop.   Pulmonary:     Effort: Pulmonary effort is normal. No tachypnea or respiratory distress.     Breath sounds: Normal breath sounds. No decreased breath sounds, wheezing, rhonchi or rales.  Chest:     Chest wall: No tenderness.  Abdominal:     General: Bowel sounds are normal.     Palpations: Abdomen is soft.  Musculoskeletal:        General: No swelling, deformity, signs of injury or edema. Normal range of motion.     Cervical back: Normal range of motion.     Right lower leg: No edema.     Left lower leg: No edema.  Skin:    General: Skin is warm and dry.  Neurological:     Mental Status: He is alert and oriented to person, place, and time.     Coordination: Coordination normal.  Psychiatric:        Mood and Affect: Mood and affect normal.        Behavior: Behavior normal. Behavior is cooperative.        Thought  Content: Thought content normal.        Judgment: Judgment normal.          Patient has been counseled extensively about nutrition and exercise as well as the importance of adherence with medications and regular follow-up. The patient was given clear instructions to go to ER or return to medical center if symptoms don't improve, worsen or new problems develop. The patient verbalized understanding.   Follow-up: Return in about 3 months (around 02/25/2021).   02/27/2021,  FNP-BC [facility] [place], [place] [phone]   12/02/2020, 10:42 PM

## 2020-11-28 LAB — BASIC METABOLIC PANEL
BUN/Creatinine Ratio: 11 (ref 9–20)
BUN: 12 mg/dL (ref 6–24)
CO2: 23 mmol/L (ref 20–29)
Calcium: 9.7 mg/dL (ref 8.7–10.2)
Chloride: 95 mmol/L — ABNORMAL LOW (ref 96–106)
Creatinine, Ser: 1.13 mg/dL (ref 0.76–1.27)
GFR calc Af Amer: 82 mL/min/{1.73_m2} (ref >59)
GFR calc non Af Amer: 71 mL/min/{1.73_m2} (ref >59)
Glucose: 98 mg/dL (ref 65–99)
Potassium: 4.1 mmol/L (ref 3.5–5.2)
Sodium: 134 mmol/L (ref 134–144)

## 2020-11-28 LAB — CBC
Hematocrit: 36.6 % — ABNORMAL LOW (ref 37.5–51.0)
Hemoglobin: 12.6 g/dL — ABNORMAL LOW (ref 13.0–17.7)
MCH: 29.6 pg (ref 26.6–33.0)
MCHC: 34.4 g/dL (ref 31.5–35.7)
MCV: 86 fL (ref 79–97)
Platelets: 299 10*3/uL (ref 150–450)
RBC: 4.26 x10E6/uL (ref 4.14–5.80)
RDW: 12.3 % (ref 11.6–15.4)
WBC: 6.9 10*3/uL (ref 3.4–10.8)

## 2020-11-28 LAB — URIC ACID: Uric Acid: 4.9 mg/dL (ref 3.8–8.4)

## 2020-11-30 ENCOUNTER — Other Ambulatory Visit: Payer: Self-pay | Admitting: Nurse Practitioner

## 2020-11-30 DIAGNOSIS — Z1211 Encounter for screening for malignant neoplasm of colon: Secondary | ICD-10-CM

## 2020-12-01 ENCOUNTER — Other Ambulatory Visit: Payer: Self-pay | Admitting: Nurse Practitioner

## 2020-12-01 DIAGNOSIS — M1990 Unspecified osteoarthritis, unspecified site: Secondary | ICD-10-CM

## 2020-12-01 MED ORDER — DICLOFENAC SODIUM 1 % EX GEL: 4.0000 g | Freq: Four times a day (QID) | CUTANEOUS | 1 refills | Status: DC

## 2020-12-02 ENCOUNTER — Encounter: Payer: Self-pay | Admitting: Nurse Practitioner

## 2020-12-04 ENCOUNTER — Other Ambulatory Visit: Payer: Self-pay

## 2020-12-04 MED FILL — DICLOFENAC SODIUM 1% GEL: 1 | 6 days supply | Qty: 100 | Fill #0

## 2020-12-05 ENCOUNTER — Ambulatory Visit: Payer: Self-pay | Attending: Nurse Practitioner

## 2020-12-05 ENCOUNTER — Other Ambulatory Visit: Payer: Self-pay

## 2020-12-05 DIAGNOSIS — Z1211 Encounter for screening for malignant neoplasm of colon: Secondary | ICD-10-CM

## 2020-12-12 ENCOUNTER — Other Ambulatory Visit: Payer: Self-pay | Admitting: Nurse Practitioner

## 2020-12-12 DIAGNOSIS — R195 Other fecal abnormalities: Secondary | ICD-10-CM

## 2020-12-12 DIAGNOSIS — Z1211 Encounter for screening for malignant neoplasm of colon: Secondary | ICD-10-CM

## 2020-12-12 LAB — FECAL OCCULT BLOOD, IMMUNOCHEMICAL: Fecal Occult Bld: POSITIVE — AB

## 2020-12-17 MED FILL — PANTOPRAZOLE SOD DR 40 MG T: 40 | 30 days supply | Qty: 30 | Fill #1

## 2020-12-17 MED FILL — ?CHLORTHALIDONE 25 MG TABLE: 25 | 30 days supply | Qty: 15 | Fill #4

## 2020-12-17 MED FILL — AMLODIPINE BESYLATE 10 MG T: 10 | 30 days supply | Qty: 30 | Fill #2

## 2020-12-17 MED FILL — ?ATORVASTATIN 20 MG TABLET: 20 | 30 days supply | Qty: 30 | Fill #4

## 2020-12-17 MED FILL — LOSARTAN POTASSIUM 100 MG T: 100 | 30 days supply | Qty: 30 | Fill #4

## 2021-01-09 ENCOUNTER — Other Ambulatory Visit: Payer: Self-pay

## 2021-01-09 DIAGNOSIS — Z20822 Contact with and (suspected) exposure to covid-19: Secondary | ICD-10-CM

## 2021-01-10 LAB — SARS-COV-2, NAA 2 DAY TAT

## 2021-01-10 LAB — NOVEL CORONAVIRUS, NAA: SARS-CoV-2, NAA: NOT DETECTED

## 2021-01-23 MED FILL — ?CHLORTHALIDONE 25 MG TABLE: 25 | 30 days supply | Qty: 15 | Fill #5

## 2021-01-23 MED FILL — LOSARTAN POTASSIUM 100 MG T: 100 | 30 days supply | Qty: 30 | Fill #5

## 2021-01-23 MED FILL — GABAPENTIN 300 MG CAPSULE: 300 | 30 days supply | Qty: 90 | Fill #1

## 2021-01-23 MED FILL — AMLODIPINE BESYLATE 10 MG T: 10 | 30 days supply | Qty: 30 | Fill #3

## 2021-01-23 MED FILL — ?ATORVASTATIN 20 MG TABLET: 20 | 30 days supply | Qty: 30 | Fill #5

## 2021-01-24 ENCOUNTER — Ambulatory Visit (INDEPENDENT_AMBULATORY_CARE_PROVIDER_SITE_OTHER): Payer: Self-pay | Admitting: Internal Medicine

## 2021-01-24 ENCOUNTER — Encounter: Payer: Self-pay | Admitting: Internal Medicine

## 2021-01-24 ENCOUNTER — Other Ambulatory Visit: Payer: Self-pay

## 2021-01-24 VITALS — BP 120/68 | HR 72 | Ht 67.0 in | Wt 131.0 lb

## 2021-01-24 DIAGNOSIS — E7849 Other hyperlipidemia: Secondary | ICD-10-CM

## 2021-01-24 DIAGNOSIS — E782 Mixed hyperlipidemia: Secondary | ICD-10-CM | POA: Insufficient documentation

## 2021-01-24 DIAGNOSIS — K219 Gastro-esophageal reflux disease without esophagitis: Secondary | ICD-10-CM

## 2021-01-24 DIAGNOSIS — I10 Essential (primary) hypertension: Secondary | ICD-10-CM

## 2021-01-24 MED FILL — FLOVENT HFA 110 MCG INHALER: 110 | 30 days supply | Qty: 12 | Fill #1

## 2021-01-24 NOTE — Progress Notes (Signed)
Cardiology Office Note:    Date:  01/24/2021   ID:  Ruben Fuentes, DOB Jan 06, 1961, MRN [id number]  PCP:  Claiborne Rigg, NP  [facility] Cardiologist:  Riley Lam MD [facility] Electrophysiologist:  None   CC: Follow up reflux and smoking cessation  History of Present Illness:    Ruben Fuentes is a 60 y.o. male with a hx of HTN, COPD, tobacco abuse (former) who presented wiith asymptomatic sinus bradycardia and GERD 10/23/20.  In interim of this visit, patient has started PPI and stopped smoking.  Patient notes that he is doing well.  Since last visit notes that he has quit smoking.  Relevant interval testing or therapy include no changes.  There are no interval hospital/ED visit.  His left elbow bursitis has been acting up.  No chest pain or pressure since.  No SOB/DOE and no PND/Orthopnea.  No weight gain or leg swelling.  No palpitations or syncope .   Past Medical History:  Diagnosis Date  . Arthritis   . Bursitis   . Hypertension     Past Surgical History:  Procedure Laterality Date  . FRACTURE SURGERY     collar bones  . FRACTURE SURGERY     right ankle    Current Medications: Current Meds  Medication Sig  . albuterol (VENTOLIN HFA) 108 (90 Base) MCG/ACT inhaler Inhale 2 puffs into the lungs every 6 (six) hours as needed for wheezing or shortness of breath.  Marland Kitchen amLODipine (NORVASC) 10 MG tablet Take 1 tablet (10 mg total) by mouth daily.  Marland Kitchen atorvastatin (LIPITOR) 20 MG tablet Take 1 tablet (20 mg total) by mouth daily.  . chlorthalidone (HYGROTON) 25 MG tablet Take 0.5 tablets (12.5 mg total) by mouth daily.  . diclofenac Sodium (VOLTAREN) 1 % GEL Apply 4 g topically 4 (four) times daily.  . fluticasone (FLOVENT HFA) 110 MCG/ACT inhaler Inhale 2 puffs into the lungs daily. Please distribute 90 days worth of inhalers  . gabapentin (NEURONTIN) 300 MG capsule Take 1 capsule (300 mg total) by mouth 3 (three) times daily. Will pick up today  .  losartan (COZAAR) 100 MG tablet Take 1 tablet (100 mg total) by mouth daily.  . pantoprazole (PROTONIX) 40 MG tablet Take 1 tablet (40 mg total) by mouth daily.     Allergies:   Patient has no known allergies.   Social History   Socioeconomic History  . Marital status: Married    Spouse name: Not on file  . Number of children: Not on file  . Years of education: Not on file  . Highest education level: Not on file  Occupational History  . Not on file  Tobacco Use  . Smoking status: Former Smoker    Packs/day: 0.25    Years: 30.00    Pack years: 7.50    Types: Cigarettes  . Smokeless tobacco: Never Used  Vaping Use  . Vaping Use: Never used  Substance and Sexual Activity  . Alcohol use: Yes    Alcohol/week: 2.0 standard drinks    Types: 2 Cans of beer per week    Comment: 2 cans of beer a day.   . Drug use: Yes    Types: Marijuana  . Sexual activity: Not Currently  Other Topics Concern  . Not on file  Social History Narrative  . 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 There is no history of Colon cancer. History of coronary artery disease notable for father. History of heart failure notable for no members. History of arrhythmia notable for no members.  ROS:   Please see the history of present illness.    All other systems reviewed and are negative.  EKGs/Labs/Other Studies Reviewed:    The following studies were reviewed today:  EKG:   10/23/2020 sinus bradycardia rate 53 without evidence of high grade AV block 07/16/20:  Sinus bradycardia 49 PR  160  Recent Labs: 07/16/2020: ALT 17; TSH 0.779 11/27/2020: BUN 12; Creatinine, Ser 1.13; Hemoglobin 12.6; Platelets 299; Potassium 4.1; Sodium 134  Recent Lipid Panel    Component Value Date/Time   CHOL 145 04/18/2020 1401   TRIG 48 04/18/2020 1401   HDL 71 04/18/2020 1401   CHOLHDL 2.0 04/18/2020 1401   LDLCALC 63 04/18/2020 1401   Platelets: 296  Physical Exam:    VS:  BP 120/68   Pulse 72   Ht 5\' 7"  (1.702 m)   Wt 131 lb (59.4 kg)   SpO2 98%   BMI 20.52 kg/m     Wt Readings from Last 3 Encounters:  01/24/21 131 lb (59.4 kg)  11/27/20 127 lb (57.6 kg)  10/23/20 127 lb 3.2 oz (57.7 kg)    GEN: Well nourished, well developed in no acute distress HEENT: Normal NECK: No JVD; No carotid bruits LYMPHATICS: No lymphadenopathy CARDIAC: RRR, no murmurs, rubs, gallops RESPIRATORY:  Clear to auscultation without rales, wheezing or rhonchi  ABDOMEN: Soft, non-tender, non-distended MUSCULOSKELETAL:  No edema; No deformity  SKIN: Warm and dry NEUROLOGIC:  Alert and oriented x 3 PSYCHIATRIC:  Normal affect   ASSESSMENT:    1. Other hyperlipidemia   2. Essential hypertension   3. Gastroesophageal reflux disease without esophagitis    PLAN:    HLD - LDL at goal on present medication (atorvastatin 20 mg) - check in one year  Essential Hypertension - ambulatory blood pressure at goal, willcontinue ambulatory BP monitoring; gave education on how to perform ambulatory blood pressure monitoring including the frequency and technique; goal ambulatory blood pressure < 135/85 on average - continue home medications   GERD - resolved with smoking cessation and pantoprazole 40 mg  Tobacco Abuse- Former Quit the day after our last visit   One year follow up unless new symptoms or abnormal test results warranting change in plan  Would be reasonable for  Video Visit Follow up    Medication Adjustments/Labs and Tests Ordered: Current medicines are reviewed at length with the  patient today.  Concerns regarding medicines are outlined above.  No orders of the defined types were placed in this encounter.  No orders of the defined types were placed in this encounter.   Patient Instructions  Medication Instructions:  Your provider recommends that you continue on your current medications as directed. Please refer to the Current Medication list given to you today.   *If you need a refill on your cardiac medications before your next appointment, please call your pharmacy*   Follow-Up: At [facility], you and your health needs are our priority.  As part of  our continuing mission to provide you with exceptional heart care, we have created designated Provider Care Teams.  These Care Teams include your primary Cardiologist (physician) and Advanced Practice Providers (APPs -  Physician Assistants and Nurse Practitioners) who all work together to provide you with the care you need, when you need it. Your next appointment:   12 month(s) The format for your next appointment:   In Person Provider:   You may see Dr. Izora Ribas or one of the following Advanced Practice Providers on your designated Care Team:    Tereso Newcomer, PA-C  Chelsea Aus, PA-C      Signed, Christell Constant, MD  01/24/2021 9:27 AM     [facility]

## 2021-01-24 NOTE — Patient Instructions (Signed)
Medication Instructions:  Your provider recommends that you continue on your current medications as directed. Please refer to the Current Medication list given to you today.   *If you need a refill on your cardiac medications before your next appointment, please call your pharmacy*   Follow-Up: At [facility], you and your health needs are our priority.  As part of our continuing mission to provide you with exceptional heart care, we have created designated Provider Care Teams.  These Care Teams include your primary Cardiologist (physician) and Advanced Practice Providers (APPs -  Physician Assistants and Nurse Practitioners) who all work together to provide you with the care you need, when you need it. Your next appointment:   12 month(s) The format for your next appointment:   In Person Provider:   You may see Dr. Izora Ribas or one of the following Advanced Practice Providers on your designated Care Team:    Tereso Newcomer, PA-C  [place], [place]

## 2021-02-01 ENCOUNTER — Telehealth: Payer: Self-pay | Admitting: Nurse Practitioner

## 2021-02-01 NOTE — Telephone Encounter (Signed)
Copied from CRM [id number]. Topic: General - Other >> Feb 01, 2021  1:11 PM Jaquita Rector A wrote: Reason for CRM: Patient wife [name] called in needing to speak to [facility] about rescheduling the appointment for 02/05/21. Say that she need to speak to [facility] ar his assistant to explain why she need to reschedule this visit. Please call  Ph# [phone]

## 2021-02-04 NOTE — Telephone Encounter (Signed)
I return Pt call, LVM to call me back 

## 2021-02-05 ENCOUNTER — Ambulatory Visit: Payer: Self-pay

## 2021-02-26 ENCOUNTER — Encounter: Payer: Self-pay | Admitting: Nurse Practitioner

## 2021-02-26 ENCOUNTER — Ambulatory Visit: Payer: Self-pay | Attending: Nurse Practitioner | Admitting: Nurse Practitioner

## 2021-02-26 ENCOUNTER — Other Ambulatory Visit: Payer: Self-pay

## 2021-02-26 ENCOUNTER — Other Ambulatory Visit: Payer: Self-pay | Admitting: Nurse Practitioner

## 2021-02-26 DIAGNOSIS — K219 Gastro-esophageal reflux disease without esophagitis: Secondary | ICD-10-CM

## 2021-02-26 DIAGNOSIS — J449 Chronic obstructive pulmonary disease, unspecified: Secondary | ICD-10-CM

## 2021-02-26 DIAGNOSIS — I10 Essential (primary) hypertension: Secondary | ICD-10-CM

## 2021-02-26 DIAGNOSIS — E782 Mixed hyperlipidemia: Secondary | ICD-10-CM

## 2021-02-26 DIAGNOSIS — M1990 Unspecified osteoarthritis, unspecified site: Secondary | ICD-10-CM

## 2021-02-26 MED ORDER — ATORVASTATIN CALCIUM 20 MG PO TABS
20.0000 mg | ORAL_TABLET | Freq: Every day | ORAL | 1 refills | Status: DC
Start: 2021-02-26 — End: 2021-02-26

## 2021-02-26 MED ORDER — GABAPENTIN 300 MG PO CAPS: 300.0000 mg | ORAL_CAPSULE | Freq: Three times a day (TID) | ORAL | 1 refills | Status: DC

## 2021-02-26 MED ORDER — DICLOFENAC SODIUM 1 % EX GEL
4.0000 g | Freq: Four times a day (QID) | CUTANEOUS | 1 refills | Status: DC
Start: 2021-02-26 — End: 2021-02-26

## 2021-02-26 MED ORDER — CHLORTHALIDONE 25 MG PO TABS
12.5000 mg | ORAL_TABLET | Freq: Every day | ORAL | 0 refills | Status: DC
Start: 2021-02-26 — End: 2021-02-26

## 2021-02-26 MED ORDER — AMLODIPINE BESYLATE 10 MG PO TABS: 10.0000 mg | ORAL_TABLET | Freq: Every day | ORAL | 0 refills | Status: DC

## 2021-02-26 MED ORDER — FLOVENT HFA 110 MCG/ACT IN AERO: 2.0000 | INHALATION_SPRAY | Freq: Every day | RESPIRATORY_TRACT | 6 refills | Status: DC

## 2021-02-26 MED ORDER — LOSARTAN POTASSIUM 100 MG PO TABS: 100.0000 mg | ORAL_TABLET | Freq: Every day | ORAL | 0 refills | Status: DC

## 2021-02-26 MED ORDER — PANTOPRAZOLE SODIUM 40 MG PO TBEC
40.0000 mg | DELAYED_RELEASE_TABLET | Freq: Every day | ORAL | 3 refills | Status: DC
Start: 2021-02-26 — End: 2021-02-26

## 2021-02-26 MED FILL — DICLOFENAC SODIUM 1% GEL: 1 | 6 days supply | Qty: 100 | Fill #0

## 2021-02-26 MED FILL — AMLODIPINE BESYLATE 10 MG T: 10 | 30 days supply | Qty: 30 | Fill #0

## 2021-02-26 MED FILL — GABAPENTIN 300 MG CAPSULE: 300 | 30 days supply | Qty: 90 | Fill #0

## 2021-02-26 MED FILL — CHLORTHALIDONE 25 MG TAB: 25 | 30 days supply | Qty: 15 | Fill #0

## 2021-02-26 MED FILL — LOSARTAN POTASSIUM 100 MG T: 100 | 30 days supply | Qty: 30 | Fill #0

## 2021-02-26 MED FILL — PANTOPRAZOLE SOD DR 40 MG T: 40 | 30 days supply | Qty: 30 | Fill #0

## 2021-02-26 MED FILL — FLOVENT HFA 110 MCG INHALER: 110 | 30 days supply | Qty: 12 | Fill #0

## 2021-02-26 MED FILL — ?ATORVASTATIN 20 MG TABLET: 20 | 30 days supply | Qty: 30 | Fill #0

## 2021-02-26 NOTE — Progress Notes (Signed)
Virtual Visit via Telephone Note Due to national recommendations of social distancing due to COVID 19, telehealth visit is felt to be most appropriate for this patient at this time.  I discussed the limitations, risks, security and privacy concerns of performing an evaluation and management service by telephone and the availability of in person appointments. I also discussed with the patient that there may be a patient responsible charge related to this service. The patient expressed understanding and agreed to proceed.    I connected with Ruben Fuentes on 02/26/21  at   9:50 AM EDT  EDT by telephone and verified that I am speaking with the correct person using two identifiers.   Consent I discussed the limitations, risks, security and privacy concerns of performing an evaluation and management service by telephone and the availability of in person appointments. I also discussed with the patient that there may be a patient responsible charge related to this service. The patient expressed understanding and agreed to proceed.   Location of Patient: Private Residence   [facility] of Provider: [facility]    Persons participating in Telemedicine visit: Bertram Denver FNP-BC [facility] [place] CMA Ruben Fuentes    History of Present Illness: Telemedicine visit for: Follow up  has a past medical history of Arthritis, Bursitis, and Hypertension.  States blood pressure was elevated this morning due to pain in his left thumb (150/80).  He is currently taking chlorthalidone 12.5 mg daily, losartan 100 mg daily, amlodipine 10 mg daily as prescribed. .Denies chest pain, shortness of breath, palpitations, lightheadedness, dizziness, headaches or BLE edema.  BP Readings from Last 3 Encounters:  01/24/21 120/68  11/27/20 113/68  10/23/20 132/68    Chronic left thumb pain ongoing on and off for several months. Uric acid levels have been normal. 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 Will treat as OA at this time. May need xray if continues. He denies any injury or trauma.    Past Medical History:  Diagnosis Date  . Arthritis   . Bursitis   . Hypertension     Past Surgical History:  Procedure Laterality Date  . FRACTURE SURGERY     collar bones  . FRACTURE SURGERY     right ankle    Family History  Problem Relation Age of Onset  . Hypertension Father   . Rheum arthritis Sister   . Diabetes Brother   . Hypertension Mother   . Colon cancer Neg Hx     Social History   Socioeconomic History  . Marital status: Married    Spouse name: Not on file  . Number of children: Not on file  . Years of education: Not on file  . Highest education level: Not on file  Occupational History  . Not on file  Tobacco Use  . Smoking status: Former Smoker    Packs/day: 0.25    Years: 30.00    Pack years: 7.50    Types: Cigarettes  . Smokeless tobacco: Never Used  Vaping Use  . Vaping Use: Never used  Substance and Sexual Activity  . Alcohol use: Yes    Alcohol/week: 2.0 standard drinks    Types: 2 Cans of beer per week    Comment: 2 cans of beer a day.   . Drug use: Yes    Types: Marijuana  . Sexual activity: Not Currently  Other Topics Concern  . Not on file  Social History Narrative  . Not  on file   Social Determinants of Health   Financial Resource Strain: Not on file  Food Insecurity: Not on file  Transportation Needs: Not on file  Physical Activity: Not on file  Stress: Not on file  Social Connections: Not on file     Observations/Objective: Awake, alert and oriented x 3   Review of Systems  Constitutional: Negative for fever, malaise/fatigue and weight loss.  HENT: Negative.  Negative for nosebleeds.   Eyes: Negative.  Negative for blurred vision, double vision and photophobia.  Respiratory: Negative.  Negative for cough, hemoptysis, sputum production, shortness of breath and wheezing.        Symptoms well controlled   Cardiovascular: Negative.  Negative for chest pain, palpitations and leg swelling.  Gastrointestinal: Positive for heartburn. Negative for nausea and vomiting.  Musculoskeletal: Positive for joint pain. Negative for myalgias.  Neurological: Negative.  Negative for dizziness, focal weakness, seizures and headaches.  Psychiatric/Behavioral: Negative.  Negative for suicidal ideas.    Assessment and Plan: Diagnoses and all orders for this visit:  Essential hypertension -     amLODipine (NORVASC) 10 MG tablet; Take 1 tablet (10 mg total) by mouth daily. -     losartan (COZAAR) 100 MG tablet; Take 1 tablet (100 mg total) by mouth daily. -     chlorthalidone (HYGROTON) 25 MG tablet; Take 0.5 tablets (12.5 mg total) by mouth daily. Continue all antihypertensives as prescribed.  Remember to bring in your blood pressure log with you for your follow up appointment.  DASH/Mediterranean Diets are healthier choices for HTN.    Hyperlipidemia, mixed -     atorvastatin (LIPITOR) 20 MG tablet; Take 1 tablet (20 mg total) by mouth daily. INSTRUCTIONS: Work on a low fat, heart healthy diet and participate in regular aerobic exercise program by working out at least 150 minutes per week; 5 days a week-30 minutes per day. Avoid red meat/beef/steak,  fried foods. junk foods, sodas, sugary drinks, unhealthy snacking, alcohol and smoking.  Drink at least 80 oz of water per day and monitor your carbohydrate intake daily.   COPD GOLD II -     fluticasone (FLOVENT HFA) 110 MCG/ACT inhaler; Inhale 2 puffs into the lungs daily. Please distribute 90 days worth of inhalers  Arthritis -     gabapentin (NEURONTIN) 300 MG capsule; Take 1 capsule (300 mg total) by mouth 3 (three) times daily. -     diclofenac Sodium (VOLTAREN) 1 % GEL; Apply 4 g topically 4 (four) times daily.  Gastroesophageal reflux disease without esophagitis -     pantoprazole (PROTONIX) 40 MG tablet; Take 1 tablet (40 mg total) by mouth  daily. INSTRUCTIONS: Avoid GERD Triggers: acidic, spicy or fried foods, caffeine, coffee, sodas,  alcohol and chocolate.     Follow Up Instructions Return in about 3 months (around 05/29/2021).     I discussed the assessment and treatment plan with the patient. The patient was provided an opportunity to ask questions and all were answered. The patient agreed with the plan and demonstrated an understanding of the instructions.   The patient was advised to call back or seek an in-person evaluation if the symptoms worsen or if the condition fails to improve as anticipated.  I provided 15 minutes of non-face-to-face time during this encounter including median intraservice time, reviewing previous notes, labs, imaging, medications and explaining diagnosis and management.  Claiborne Rigg, FNP-BC

## 2021-03-01 ENCOUNTER — Other Ambulatory Visit: Payer: Self-pay

## 2021-03-01 ENCOUNTER — Ambulatory Visit: Payer: Self-pay | Attending: Nurse Practitioner

## 2021-03-09 ENCOUNTER — Other Ambulatory Visit: Payer: Self-pay

## 2021-03-20 MED FILL — Chlorthalidone Tab 25 MG: ORAL | 30 days supply | Qty: 15 | Fill #0 | Status: AC

## 2021-03-21 ENCOUNTER — Other Ambulatory Visit: Payer: Self-pay

## 2021-03-28 ENCOUNTER — Other Ambulatory Visit: Payer: Self-pay

## 2021-03-28 MED FILL — Amlodipine Besylate Tab 10 MG (Base Equivalent): ORAL | 30 days supply | Qty: 30 | Fill #0 | Status: AC

## 2021-03-28 MED FILL — Atorvastatin Calcium Tab 20 MG (Base Equivalent): ORAL | 30 days supply | Qty: 30 | Fill #0 | Status: AC

## 2021-03-28 MED FILL — Chlorthalidone Tab 25 MG: ORAL | 30 days supply | Qty: 15 | Fill #1 | Status: CN

## 2021-03-28 MED FILL — Losartan Potassium Tab 100 MG: ORAL | 30 days supply | Qty: 30 | Fill #0 | Status: AC

## 2021-03-29 ENCOUNTER — Other Ambulatory Visit: Payer: Self-pay

## 2021-04-01 ENCOUNTER — Telehealth: Payer: Self-pay | Admitting: *Deleted

## 2021-04-01 NOTE — Telephone Encounter (Signed)
Pt's wife Ruben Fuentes called to confirm his appt at [facility] 04/03/21 at 0930; no appt noted for appt; his wife says she made the appt on 03/14/21; she is not sure of who she spoke with;  she will be contacted by [employer] agent to discuss; she can be contacted at [phone]; Mrs [name] verbalized understanding.

## 2021-04-03 ENCOUNTER — Ambulatory Visit: Payer: Self-pay | Attending: Internal Medicine

## 2021-04-03 DIAGNOSIS — Z23 Encounter for immunization: Secondary | ICD-10-CM

## 2021-04-03 NOTE — Progress Notes (Signed)
   Covid-19 Vaccination Clinic  Name:  Ruben Fuentes    MRN: [id number] DOB: 10-Oct-1961  04/03/2021  Ruben Fuentes was observed post Covid-19 immunization for 15 minutes without incident. He was provided with Vaccine Information Sheet and instruction to access the V-Safe system.   Ruben Fuentes was instructed to call 911 with any severe reactions post vaccine: Marland Kitchen Difficulty breathing  . Swelling of face and throat  . A fast heartbeat  . A bad rash all over body  . Dizziness and weakness   Immunizations Administered    Name Date Dose VIS Date Route   PFIZER Comrnaty(Gray TOP) Covid-19 Vaccine 04/03/2021  9:23 AM 0.3 mL 11/15/2020 Intramuscular   Manufacturer: [employer], [employer]   Lot: [id number]   NDC: [phone]

## 2021-04-08 ENCOUNTER — Other Ambulatory Visit (HOSPITAL_COMMUNITY): Payer: Self-pay

## 2021-04-08 MED ORDER — COVID-19 MRNA VAC-TRIS(PFIZER) 30 MCG/0.3ML IM SUSP
INTRAMUSCULAR | 0 refills | Status: DC
  Filled 2021-04-08: qty 0.3, 17d supply, fill #0

## 2021-04-29 MED FILL — Chlorthalidone Tab 25 MG: ORAL | 30 days supply | Qty: 15 | Fill #1 | Status: AC

## 2021-04-29 MED FILL — Atorvastatin Calcium Tab 20 MG (Base Equivalent): ORAL | 30 days supply | Qty: 30 | Fill #1 | Status: AC

## 2021-04-29 MED FILL — Amlodipine Besylate Tab 10 MG (Base Equivalent): ORAL | 30 days supply | Qty: 30 | Fill #1 | Status: AC

## 2021-04-29 MED FILL — Gabapentin Cap 300 MG: ORAL | 30 days supply | Qty: 90 | Fill #0 | Status: AC

## 2021-04-29 MED FILL — Losartan Potassium Tab 100 MG: ORAL | 30 days supply | Qty: 30 | Fill #1 | Status: AC

## 2021-04-30 ENCOUNTER — Other Ambulatory Visit: Payer: Self-pay

## 2021-05-08 ENCOUNTER — Other Ambulatory Visit: Payer: Self-pay

## 2021-05-08 ENCOUNTER — Ambulatory Visit: Payer: Self-pay | Attending: Nurse Practitioner

## 2021-05-15 ENCOUNTER — Other Ambulatory Visit: Payer: Self-pay

## 2021-05-15 ENCOUNTER — Other Ambulatory Visit: Payer: Self-pay | Admitting: Nurse Practitioner

## 2021-05-15 DIAGNOSIS — I10 Essential (primary) hypertension: Secondary | ICD-10-CM

## 2021-05-15 MED ORDER — CHLORTHALIDONE 25 MG PO TABS
ORAL_TABLET | Freq: Every day | ORAL | 0 refills | Status: DC
  Filled 2021-05-15: qty 45, fill #0

## 2021-05-29 ENCOUNTER — Encounter: Payer: Self-pay | Admitting: Nurse Practitioner

## 2021-05-29 ENCOUNTER — Other Ambulatory Visit: Payer: Self-pay

## 2021-05-29 ENCOUNTER — Ambulatory Visit: Payer: Self-pay | Attending: Nurse Practitioner | Admitting: Nurse Practitioner

## 2021-05-29 DIAGNOSIS — J449 Chronic obstructive pulmonary disease, unspecified: Secondary | ICD-10-CM

## 2021-05-29 DIAGNOSIS — F17200 Nicotine dependence, unspecified, uncomplicated: Secondary | ICD-10-CM

## 2021-05-29 DIAGNOSIS — I10 Essential (primary) hypertension: Secondary | ICD-10-CM

## 2021-05-29 DIAGNOSIS — E782 Mixed hyperlipidemia: Secondary | ICD-10-CM

## 2021-05-29 DIAGNOSIS — M25522 Pain in left elbow: Secondary | ICD-10-CM

## 2021-05-29 DIAGNOSIS — K219 Gastro-esophageal reflux disease without esophagitis: Secondary | ICD-10-CM

## 2021-05-29 DIAGNOSIS — G8929 Other chronic pain: Secondary | ICD-10-CM

## 2021-05-29 MED ORDER — CHLORTHALIDONE 25 MG PO TABS
ORAL_TABLET | Freq: Every day | ORAL | 0 refills | Status: DC
  Filled 2021-05-29: qty 15, 30d supply, fill #0
  Filled 2021-06-25 – 2021-07-06 (×2): qty 15, 30d supply, fill #1

## 2021-05-29 MED ORDER — ALBUTEROL SULFATE HFA 108 (90 BASE) MCG/ACT IN AERS
2.0000 | INHALATION_SPRAY | Freq: Four times a day (QID) | RESPIRATORY_TRACT | 0 refills | Status: DC | PRN
  Filled 2021-05-29: qty 18, 25d supply, fill #0

## 2021-05-29 MED ORDER — PANTOPRAZOLE SODIUM 40 MG PO TBEC
DELAYED_RELEASE_TABLET | Freq: Every day | ORAL | 3 refills | Status: DC
  Filled 2021-05-29: qty 30, 30d supply, fill #0
  Filled 2021-07-06: qty 30, 30d supply, fill #1
  Filled 2021-08-06: qty 30, 30d supply, fill #2
  Filled 2021-09-17: qty 30, 30d supply, fill #3
  Filled 2021-10-21: qty 30, 30d supply, fill #4
  Filled 2021-11-18: qty 30, 30d supply, fill #5
  Filled 2021-12-21: qty 30, 30d supply, fill #6
  Filled 2021-12-23 – 2021-12-30 (×2): qty 30, 30d supply, fill #0
  Filled 2022-01-23: qty 30, 30d supply, fill #1
  Filled 2022-02-21: qty 90, 90d supply, fill #2
  Filled 2022-05-22: qty 30, 30d supply, fill #3

## 2021-05-29 MED ORDER — AMLODIPINE BESYLATE 10 MG PO TABS
ORAL_TABLET | Freq: Every day | ORAL | 0 refills | Status: DC
  Filled 2021-05-29: qty 30, 30d supply, fill #0
  Filled 2021-07-06: qty 30, 30d supply, fill #1
  Filled 2021-08-06: qty 30, 30d supply, fill #2

## 2021-05-29 MED ORDER — ATORVASTATIN CALCIUM 20 MG PO TABS
ORAL_TABLET | Freq: Every day | ORAL | 1 refills | Status: DC
  Filled 2021-05-29: qty 30, 30d supply, fill #0
  Filled 2021-07-06: qty 30, 30d supply, fill #1
  Filled 2021-08-06: qty 30, 30d supply, fill #2
  Filled 2021-09-17: qty 30, 30d supply, fill #3
  Filled 2021-10-21: qty 30, 30d supply, fill #4
  Filled 2021-11-18: qty 30, 30d supply, fill #5

## 2021-05-29 MED ORDER — LOSARTAN POTASSIUM 100 MG PO TABS
ORAL_TABLET | Freq: Every day | ORAL | 0 refills | Status: DC
  Filled 2021-05-29: qty 30, 30d supply, fill #0
  Filled 2021-07-06: qty 30, 30d supply, fill #1
  Filled 2021-08-06: qty 30, 30d supply, fill #2

## 2021-05-29 MED ORDER — ACETAMINOPHEN-CODEINE #3 300-30 MG PO TABS
1.0000 | ORAL_TABLET | Freq: Three times a day (TID) | ORAL | 0 refills | Status: DC | PRN
  Filled 2021-05-29: qty 30, 10d supply, fill #0

## 2021-05-29 NOTE — Progress Notes (Signed)
Virtual Visit via Telephone Note Due to national recommendations of social distancing due to COVID 19, telehealth visit is felt to be most appropriate for this patient at this time.  I discussed the limitations, risks, security and privacy concerns of performing an evaluation and management service by telephone and the availability of in person appointments. I also discussed with the patient that there may be a patient responsible charge related to this service. The patient expressed understanding and agreed to proceed.    I connected with Ruben Fuentes on 05/29/21  at   9:10 AM EDT  EDT by telephone and verified that I am speaking with the correct person using two identifiers.  Location of Patient: Private Residence   [facility] of Provider[facility]    Persons participating in Telemedicine visit: Bertram Denver FNP-BC Ruben Fuentes    History of Present Illness: Telemedicine visit for: Follow up to joint pain  has a past medical history of Arthritis, Bursitis, and Hypertension.   Left elbow pain Onset of pain was over a year ago.  Endorses numbness radiating from the elbow down to the left hand and fingers.  Denies any injury or trauma.  There is limited range of motion as flexion of the left arm causes elbow pain.  Treatments tried include diclofenac gel, gabapentin, naproxen and Tylenol 3.  None of which have been very effective aside from Tylenol 3.  His Tylenol 3 was stopped in the past due to positive urine drug screen of marijuana.  He states he is no longer smoking marijuana.  He has chronic low back pain and multiple joint arthralgias however pain is worse in the left elbow.  He does have a history of for side effects and may likely need to be referred to orthopedics.  Essential Hypertension Blood pressure is well controlled with amlodipine 10 mg daily and losartan 100 mg daily.Denies chest pain, shortness of breath, palpitations,  lightheadedness, dizziness, headaches or BLE edema.     BP Readings from Last 3 Encounters:  01/24/21 120/68  11/27/20 113/68  10/23/20 132/68          Past Medical History:  Diagnosis Date   Arthritis    Bursitis    Hypertension     Past Surgical History:  Procedure Laterality Date   FRACTURE SURGERY     collar bones   FRACTURE SURGERY     right ankle    Family History  Problem Relation Age of Onset   Hypertension Father    Rheum arthritis Sister    Diabetes Brother    Hypertension Mother    Colon cancer Neg Hx     Social History   Socioeconomic History   Marital status: Married    Spouse name: Not on file   Number of children: Not on file   Years of education: Not on file   Highest education level: Not on file  Occupational History   Not on file  Tobacco Use   Smoking status: Former    Packs/day: 0.25    Years: 30.00    Pack years: 7.50    Types: Cigarettes   Smokeless tobacco: Never  Vaping Use   Vaping Use: Never used  Substance and Sexual Activity   Alcohol use: Yes    Alcohol/week: 2.0 standard drinks    Types: 2 Cans of beer per week    Comment: 2 cans of beer a day.    Drug use: Yes    Types: Marijuana  Sexual activity: Not Currently  Other Topics Concern   Not on file  Social History Narrative   Not on file   Social Determinants of Health   Financial Resource Strain: Not on file  Food Insecurity: Not on file  Transportation Needs: Not on file  Physical Activity: Not on file  Stress: Not on file  Social Connections: Not on file     Observations/Objective: Awake, alert and oriented x 3   Review of Systems  Constitutional:  Negative for fever, malaise/fatigue and weight loss.  HENT: Negative.  Negative for nosebleeds.   Eyes: Negative.  Negative for blurred vision, double vision and photophobia.  Respiratory: Negative.  Negative for cough and shortness of breath.   Cardiovascular: Negative.  Negative for chest pain,  palpitations and leg swelling.  Gastrointestinal:  Positive for heartburn. Negative for nausea and vomiting.  Musculoskeletal:  Positive for joint pain. Negative for myalgias.  Neurological: Negative.  Negative for dizziness, focal weakness, seizures and headaches.  Psychiatric/Behavioral: Negative.  Negative for suicidal ideas.    Assessment and Plan: Diagnoses and all orders for this visit:  Essential hypertension -     amLODipine (NORVASC) 10 MG tablet; TAKE 1 TABLET (10 MG TOTAL) BY MOUTH DAILY. -     chlorthalidone (HYGROTON) 25 MG tablet; TAKE 0.5 TABLETS (12.5 MG TOTAL) BY MOUTH DAILY. -     losartan (COZAAR) 100 MG tablet; TAKE 1 TABLET (100 MG TOTAL) BY MOUTH DAILY. Continue all antihypertensives as prescribed.  Remember to bring in your blood pressure log with you for your follow up appointment.  DASH/Mediterranean Diets are healthier choices for HTN.    Chronic elbow pain, left -     DG Elbow Complete Left; Future -     acetaminophen-codeine (TYLENOL #3) 300-30 MG tablet; Take 1 tablet by mouth every 8 (eight) hours as needed for moderate pain.  Tobacco dependence -     albuterol (VENTOLIN HFA) 108 (90 Base) MCG/ACT inhaler; Inhale 2 puffs into the lungs every 6 (six) hours as needed for wheezing or shortness of breath.  COPD GOLD II -     albuterol (VENTOLIN HFA) 108 (90 Base) MCG/ACT inhaler; Inhale 2 puffs into the lungs every 6 (six) hours as needed for wheezing or shortness of breath.  Hyperlipidemia, mixed -     atorvastatin (LIPITOR) 20 MG tablet; TAKE 1 TABLET (20 MG TOTAL) BY MOUTH DAILY. INSTRUCTIONS: Work on a low fat, heart healthy diet and participate in regular aerobic exercise program by working out at least 150 minutes per week; 5 days a week-30 minutes per day. Avoid red meat/beef/steak,  fried foods. junk foods, sodas, sugary drinks, unhealthy snacking, alcohol and smoking.  Drink at least 80 oz of water per day and monitor your carbohydrate intake daily.     Gastroesophageal reflux disease without esophagitis -     pantoprazole (PROTONIX) 40 MG tablet; TAKE 1 TABLET (40 MG TOTAL) BY MOUTH DAILY.  INSTRUCTIONS: Avoid GERD Triggers: acidic, spicy or fried foods, caffeine, coffee, sodas,  alcohol and chocolate.    Follow Up Instructions Return for HTN/HPL.     I discussed the assessment and treatment plan with the patient. The patient was provided an opportunity to ask questions and all were answered. The patient agreed with the plan and demonstrated an understanding of the instructions.   The patient was advised to call back or seek an in-person evaluation if the symptoms worsen or if the condition fails to improve as anticipated.  I provided  20 minutes of non-face-to-face time during this encounter including median intraservice time, reviewing previous notes, labs, imaging, medications and explaining diagnosis and management.  Claiborne Rigg, FNP-BC

## 2021-05-30 ENCOUNTER — Other Ambulatory Visit: Payer: Self-pay

## 2021-05-30 ENCOUNTER — Ambulatory Visit (HOSPITAL_COMMUNITY)
Admission: RE | Admit: 2021-05-30 | Discharge: 2021-05-30 | Disposition: A | Discharge status: Home / Self Care | Payer: Self-pay | Source: Ambulatory Visit | Attending: Nurse Practitioner | Admitting: Nurse Practitioner

## 2021-05-30 DIAGNOSIS — I10 Essential (primary) hypertension: Secondary | ICD-10-CM

## 2021-05-30 DIAGNOSIS — G8929 Other chronic pain: Secondary | ICD-10-CM | POA: Insufficient documentation

## 2021-05-30 DIAGNOSIS — M25522 Pain in left elbow: Secondary | ICD-10-CM | POA: Insufficient documentation

## 2021-05-31 LAB — CMP14+EGFR
ALT: 35 IU/L (ref 0–44)
AST: 36 IU/L (ref 0–40)
Albumin/Globulin Ratio: 1.8 (ref 1.2–2.2)
Albumin: 4.8 g/dL (ref 3.8–4.9)
Alkaline Phosphatase: 72 IU/L (ref 44–121)
BUN/Creatinine Ratio: 10 (ref 9–20)
BUN: 10 mg/dL (ref 6–24)
Bilirubin Total: 0.3 mg/dL (ref 0.0–1.2)
CO2: 23 mmol/L (ref 20–29)
Calcium: 9.9 mg/dL (ref 8.7–10.2)
Chloride: 99 mmol/L (ref 96–106)
Creatinine, Ser: 1.04 mg/dL (ref 0.76–1.27)
Globulin, Total: 2.6 g/dL (ref 1.5–4.5)
Glucose: 108 mg/dL — ABNORMAL HIGH (ref 65–99)
Potassium: 4.5 mmol/L (ref 3.5–5.2)
Sodium: 137 mmol/L (ref 134–144)
Total Protein: 7.4 g/dL (ref 6.0–8.5)
eGFR: 83 mL/min/{1.73_m2} (ref >59)

## 2021-05-31 LAB — CBC
Hematocrit: 34.8 % — ABNORMAL LOW (ref 37.5–51.0)
Hemoglobin: 11.6 g/dL — ABNORMAL LOW (ref 13.0–17.7)
MCH: 29.3 pg (ref 26.6–33.0)
MCHC: 33.3 g/dL (ref 31.5–35.7)
MCV: 88 fL (ref 79–97)
Platelets: 303 10*3/uL (ref 150–450)
RBC: 3.96 x10E6/uL — ABNORMAL LOW (ref 4.14–5.80)
RDW: 13.2 % (ref 11.6–15.4)
WBC: 8 10*3/uL (ref 3.4–10.8)

## 2021-05-31 LAB — LIPID PANEL
Chol/HDL Ratio: 2.9 ratio (ref 0.0–5.0)
Cholesterol, Total: 168 mg/dL (ref 100–199)
HDL: 57 mg/dL (ref >39)
LDL Chol Calc (NIH): 100 mg/dL — ABNORMAL HIGH (ref 0–99)
Triglycerides: 56 mg/dL (ref 0–149)
VLDL Cholesterol Cal: 11 mg/dL (ref 5–40)

## 2021-06-07 ENCOUNTER — Telehealth: Payer: Self-pay | Admitting: Nurse Practitioner

## 2021-06-07 NOTE — Telephone Encounter (Signed)
Copied from CRM [id number]. Topic: General - Other >> Jun 06, 2021  4:02 PM Mcneil, Ja-Kwan wrote: Reason for CRM: Pt wife stated pt the body pain and pain in pt left arm is not getting any better. Pt wife asked that pt be contact to discuss options on pain relief. Cb# [phone]

## 2021-06-11 NOTE — Telephone Encounter (Signed)
Pt was called and informed of medication being sent to pharmacy. 

## 2021-06-25 ENCOUNTER — Other Ambulatory Visit: Payer: Self-pay

## 2021-06-25 ENCOUNTER — Other Ambulatory Visit: Payer: Self-pay | Admitting: Nurse Practitioner

## 2021-06-25 DIAGNOSIS — M1990 Unspecified osteoarthritis, unspecified site: Secondary | ICD-10-CM

## 2021-06-25 DIAGNOSIS — G8929 Other chronic pain: Secondary | ICD-10-CM

## 2021-06-25 MED ORDER — ACETAMINOPHEN-CODEINE #3 300-30 MG PO TABS
1.0000 | ORAL_TABLET | Freq: Three times a day (TID) | ORAL | 0 refills | Status: AC | PRN
  Filled 2021-06-25 – 2021-08-06 (×2): qty 30, 10d supply, fill #0

## 2021-06-25 MED ORDER — GABAPENTIN 300 MG PO CAPS
ORAL_CAPSULE | Freq: Three times a day (TID) | ORAL | 1 refills | Status: DC
  Filled 2021-06-25 – 2021-07-06 (×2): qty 90, 30d supply, fill #0
  Filled 2021-08-19: qty 90, 30d supply, fill #1

## 2021-06-25 NOTE — Telephone Encounter (Signed)
Requested medication (s) are due for refill today- yes  Requested medication (s) are on the active medication list -yes  Future visit scheduled 0yes  Last refill: 05/29/21 #30  Notes to clinic: Request RF- non delegated Rx  Requested Prescriptions  Pending Prescriptions Disp Refills   acetaminophen-codeine (TYLENOL #3) 300-30 MG tablet 30 tablet 0    Sig: Take 1 tablet by mouth every 8 (eight) hours as needed for moderate pain.      Not Delegated - Analgesics:  Opioid Agonist Combinations Failed - 06/25/2021 12:53 PM      Failed - This refill cannot be delegated      Failed - Urine Drug Screen completed in last 360 days      Passed - Valid encounter within last 6 months    Recent Outpatient Visits           3 weeks ago Essential hypertension   [facility] [place], Shea Stakes, NP   3 months ago Essential hypertension   [facility] [place], Shea Stakes, NP   7 months ago Essential hypertension   [facility] [place], Shea Stakes, NP   9 months ago Essential hypertension   [facility] [place], Shea Stakes, NP   11 months ago Essential hypertension   [facility] [place], Shea Stakes, NP       Future Appointments             In 1 month Claiborne Rigg, NP [facility] [facility]              Signed Prescriptions Disp Refills   gabapentin (NEURONTIN) 300 MG capsule 90 capsule 1    Sig: TAKE 1 CAPSULE (300 MG TOTAL) BY MOUTH 3 (THREE) TIMES DAILY.      Neurology: Anticonvulsants - gabapentin Passed - 06/25/2021 12:53 PM      Passed - Valid encounter within last 12 months    Recent Outpatient Visits           3 weeks ago Essential hypertension   [place] [facility] [place], Shea Stakes, NP   3 months ago Essential hypertension   [place] [facility] [place], Shea Stakes, NP   7 months  ago Essential hypertension   [place] [place], Shea Stakes, NP   9 months ago Essential hypertension   [facility] [place], Shea Stakes, NP   11 months ago Essential hypertension   [facility] [place], Shea Stakes, NP       Future Appointments             In 1 month Claiborne Rigg, NP [facility] [facility]                 Requested Prescriptions  Pending Prescriptions Disp Refills   acetaminophen-codeine (TYLENOL #3) 300-30 MG tablet 30 tablet 0    Sig: Take 1 tablet by mouth every 8 (eight) hours as needed for moderate pain.      Not Delegated - Analgesics:  Opioid Agonist Combinations Failed - 06/25/2021 12:53 PM      Failed - This refill cannot be delegated      Failed - Urine Drug Screen completed in last 360 days      Passed - Valid encounter within last 6 months  Recent Outpatient Visits           3 weeks ago Essential hypertension   [place] [facility] [place], Shea Stakes, NP   3 months ago Essential hypertension   [place] [facility] [place], Shea Stakes, NP   7 months ago Essential hypertension   [facility] [place], Shea Stakes, NP   9 months ago Essential hypertension   [facility] [place], Shea Stakes, NP   11 months ago Essential hypertension   [facility] [place], Shea Stakes, NP       Future Appointments             In 1 month Claiborne Rigg, NP [facility] [facility]              Signed Prescriptions Disp Refills   gabapentin (NEURONTIN) 300 MG capsule 90 capsule 1    Sig: TAKE 1 CAPSULE (300 MG TOTAL) BY MOUTH 3 (THREE) TIMES DAILY.      Neurology: Anticonvulsants - gabapentin Passed - 06/25/2021 12:53 PM      Passed - Valid encounter within last 12 months    Recent Outpatient Visits            3 weeks ago Essential hypertension   [place] [facility] [place], Shea Stakes, NP   3 months ago Essential hypertension   [place] [facility] [place], Shea Stakes, NP   7 months ago Essential hypertension   [facility] [place], Shea Stakes, NP   9 months ago Essential hypertension   [facility] [place], Shea Stakes, NP   11 months ago Essential hypertension   [facility] [place], Shea Stakes, NP       Future Appointments             In 1 month Claiborne Rigg, NP [employer] [facility]

## 2021-06-28 ENCOUNTER — Telehealth: Payer: Self-pay

## 2021-06-28 NOTE — Telephone Encounter (Signed)
Pt needs referral to dentist he has the OC.

## 2021-06-30 ENCOUNTER — Other Ambulatory Visit: Payer: Self-pay | Admitting: Nurse Practitioner

## 2021-06-30 DIAGNOSIS — K089 Disorder of teeth and supporting structures, unspecified: Secondary | ICD-10-CM

## 2021-07-02 ENCOUNTER — Other Ambulatory Visit: Payer: Self-pay

## 2021-07-08 ENCOUNTER — Other Ambulatory Visit: Payer: Self-pay

## 2021-07-30 ENCOUNTER — Other Ambulatory Visit: Payer: Self-pay

## 2021-07-30 ENCOUNTER — Ambulatory Visit: Payer: Self-pay | Attending: Nurse Practitioner | Admitting: Nurse Practitioner

## 2021-07-30 ENCOUNTER — Encounter: Payer: Self-pay | Admitting: Nurse Practitioner

## 2021-07-30 VITALS — BP 129/71 | HR 69 | Ht 67.0 in | Wt 125.0 lb

## 2021-07-30 DIAGNOSIS — R195 Other fecal abnormalities: Secondary | ICD-10-CM

## 2021-07-30 DIAGNOSIS — I10 Essential (primary) hypertension: Secondary | ICD-10-CM

## 2021-07-30 DIAGNOSIS — D649 Anemia, unspecified: Secondary | ICD-10-CM

## 2021-07-30 DIAGNOSIS — M25522 Pain in left elbow: Secondary | ICD-10-CM

## 2021-07-30 DIAGNOSIS — Z1211 Encounter for screening for malignant neoplasm of colon: Secondary | ICD-10-CM

## 2021-07-30 MED ORDER — CHLORTHALIDONE 25 MG PO TABS
12.5000 mg | ORAL_TABLET | Freq: Every day | ORAL | 1 refills | Status: DC
  Filled 2021-07-30 – 2021-12-30 (×2): qty 15, 30d supply, fill #0

## 2021-07-30 NOTE — Progress Notes (Signed)
Assessment & Plan:  Rashaan was seen today for hypertension.  Diagnoses and all orders for this visit:  Primary hypertension -     chlorthalidone (HYGROTON) 25 MG tablet; Take 0.5 tablets (12.5 mg total) by mouth daily. Continue all antihypertensives as prescribed.  Remember to bring in your blood pressure log with you for your follow up appointment.  DASH/Mediterranean Diets are healthier choices for HTN.    Left elbow pain -     Ambulatory referral to Orthopedic Surgery  Colon cancer screening -     Ambulatory referral to Gastroenterology  Positive fecal immunochemical test -     Ambulatory referral to Gastroenterology  Anemia, unspecified type -     CBC with Differential   Patient has been counseled on age-appropriate routine health concerns for screening and prevention. These are reviewed and up-to-date. Referrals have been placed accordingly. Immunizations are up-to-date or declined.    Subjective:   Chief Complaint  Patient presents with   Hypertension    Hypertension Pertinent negatives include no blurred vision, chest pain, headaches, malaise/fatigue, palpitations or shortness of breath.  Ruben Fuentes 60 y.o. male presents to office today for HTN He has a past medical history of Arthritis, Bursitis, and Hypertension.   Stool kit was positive for blood January 2022.  He had a colonoscopy in 2018 and reports no polyps.  I did refer him to GI due to positive FOBT and unspecified anemia however they were unable to reach him.  I have instructed him today that I have placed another referral and he should expect a phone call a voicemail from gastroenterology.   HYPERTENSION Blood pressure is well controlled today with amlodipine 10 mg daily, losartan 100 mg daily and chlorthalidone 12.5 mg daily. He is a light smoker.  Started smoking 35 years ago and has always only smoked about 4 to 5 cigarettes a day. Denies chest pain, shortness of breath, palpitations,  lightheadedness, dizziness, headaches or BLE edema.   BP Readings from Last 3 Encounters:  07/30/21 129/71  01/24/21 120/68  11/27/20 113/68     Left elbow pain Chronic.  He endorses a history of bursitis in his left elbow.  X-ray was negative.  He denies any injury or trauma.  Uric acid was negative.  He has been prescribed gabapentin which has been minimally effective however he has only been taking it once a day.  I have instructed him to take it 3 times a day to see if this will help with his pain.  There is no obvious swelling or joint deformities on physical exam today.  He also notes left hand paresthesia but unsure if this is related to his left elbow pain.    Review of Systems  Constitutional:  Negative for fever, malaise/fatigue and weight loss.  HENT: Negative.  Negative for nosebleeds.   Eyes: Negative.  Negative for blurred vision, double vision and photophobia.  Respiratory: Negative.  Negative for cough and shortness of breath.   Cardiovascular: Negative.  Negative for chest pain, palpitations and leg swelling.  Gastrointestinal: Negative.  Negative for heartburn, nausea and vomiting.  Musculoskeletal:  Positive for joint pain. Negative for myalgias.  Neurological:  Positive for sensory change. Negative for dizziness, focal weakness, seizures and headaches.  Psychiatric/Behavioral: Negative.  Negative for suicidal ideas.    Past Medical History:  Diagnosis Date   Arthritis    Bursitis    Hypertension     Past Surgical History:  Procedure Laterality Date   FRACTURE  SURGERY     collar bones   FRACTURE SURGERY     right ankle    Family History  Problem Relation Age of Onset   Hypertension Father    Rheum arthritis Sister    Diabetes Brother    Hypertension Mother    Colon cancer Neg Hx     Social History Reviewed with no changes to be made today.   Outpatient Medications Prior to Visit  Medication Sig Dispense Refill   albuterol (VENTOLIN HFA) 108 (90  Base) MCG/ACT inhaler Inhale 2 puffs into the lungs every 6 (six) hours as needed for wheezing or shortness of breath. 18 g 0   amLODipine (NORVASC) 10 MG tablet TAKE 1 TABLET (10 MG TOTAL) BY MOUTH DAILY. 90 tablet 0   atorvastatin (LIPITOR) 20 MG tablet TAKE 1 TABLET (20 MG TOTAL) BY MOUTH DAILY. 90 tablet 1   COVID-19 mRNA Vac-TriS, Pfizer, SUSP injection Inject into the muscle. 0.3 mL 0   diclofenac Sodium (VOLTAREN) 1 % GEL APPLY 4 G TOPICALLY 4 (FOUR) TIMES DAILY. 350 g 1   fluticasone (FLOVENT HFA) 110 MCG/ACT inhaler INHALE 2 PUFFS INTO THE LUNGS DAILY. 12 g 6   gabapentin (NEURONTIN) 300 MG capsule TAKE 1 CAPSULE (300 MG TOTAL) BY MOUTH 3 (THREE) TIMES DAILY. 90 capsule 1   losartan (COZAAR) 100 MG tablet TAKE 1 TABLET (100 MG TOTAL) BY MOUTH DAILY. 90 tablet 0   pantoprazole (PROTONIX) 40 MG tablet TAKE 1 TABLET (40 MG TOTAL) BY MOUTH DAILY. 90 tablet 3   chlorthalidone (HYGROTON) 25 MG tablet TAKE 0.5 TABLETS (12.5 MG TOTAL) BY MOUTH DAILY. 45 tablet 0   No facility-administered medications prior to visit.    No Known Allergies     Objective:    BP 129/71   Pulse 69   Ht _0  (1.702 m)   Wt 125 lb (56.7 kg)   SpO2 97%   BMI 19.58 kg/m  Wt Readings from Last 3 Encounters:  07/30/21 125 lb (56.7 kg)  01/24/21 131 lb (59.4 kg)  11/27/20 127 lb (57.6 kg)    Physical Exam Vitals and nursing note reviewed.  Constitutional:      Appearance: He is well-developed.  HENT:     Head: Normocephalic and atraumatic.  Cardiovascular:     Rate and Rhythm: Normal rate and regular rhythm.     Heart sounds: Normal heart sounds. No murmur heard.   No friction rub. No gallop.  Pulmonary:     Effort: Pulmonary effort is normal. No tachypnea or respiratory distress.     Breath sounds: Normal breath sounds. No decreased breath sounds, wheezing, rhonchi or rales.  Chest:     Chest wall: No tenderness.  Abdominal:     General: Bowel sounds are normal.     Palpations: Abdomen is  soft.  Musculoskeletal:        General: Normal range of motion.     Right elbow: Normal.     Left elbow: No swelling, deformity or effusion. Tenderness present in medial epicondyle and lateral epicondyle.     Cervical back: Normal range of motion.     Comments: He has bilateral wrist splints and bilateral knee sleeves on today.  Skin:    General: Skin is warm and dry.  Neurological:     Mental Status: He is alert and oriented to person, place, and time.     Coordination: Coordination normal.  Psychiatric:        Behavior: Behavior normal. Behavior  is cooperative.        Thought Content: Thought content normal.        Judgment: Judgment normal.         Patient has been counseled extensively about nutrition and exercise as well as the importance of adherence with medications and regular follow-up. The patient was given clear instructions to go to ER or return to medical center if symptoms don't improve, worsen or new problems develop. The patient verbalized understanding.   Follow-up: Return in about 3 weeks (around 08/20/2021) for tele left elbow pain.   Gildardo Pounds, FNP-BC [facility] and [facility] [place], [place]   07/30/2021, 9:04 AM

## 2021-07-31 LAB — CBC WITH DIFFERENTIAL/PLATELET
Basophils Absolute: 0 10*3/uL (ref 0.0–0.2)
Basos: 1 %
EOS (ABSOLUTE): 0 10*3/uL (ref 0.0–0.4)
Eos: 1 %
Hematocrit: 37.7 % (ref 37.5–51.0)
Hemoglobin: 12.5 g/dL — ABNORMAL LOW (ref 13.0–17.7)
Immature Grans (Abs): 0 10*3/uL (ref 0.0–0.1)
Immature Granulocytes: 0 %
Lymphocytes Absolute: 2.2 10*3/uL (ref 0.7–3.1)
Lymphs: 31 %
MCH: 29 pg (ref 26.6–33.0)
MCHC: 33.2 g/dL (ref 31.5–35.7)
MCV: 88 fL (ref 79–97)
Monocytes Absolute: 1.2 10*3/uL — ABNORMAL HIGH (ref 0.1–0.9)
Monocytes: 16 %
Neutrophils Absolute: 3.7 10*3/uL (ref 1.4–7.0)
Neutrophils: 51 %
Platelets: 310 10*3/uL (ref 150–450)
RBC: 4.31 x10E6/uL (ref 4.14–5.80)
RDW: 12.5 % (ref 11.6–15.4)
WBC: 7.1 10*3/uL (ref 3.4–10.8)

## 2021-08-06 ENCOUNTER — Other Ambulatory Visit: Payer: Self-pay

## 2021-08-06 ENCOUNTER — Ambulatory Visit: Payer: Self-pay | Attending: Nurse Practitioner

## 2021-08-06 DIAGNOSIS — Z23 Encounter for immunization: Secondary | ICD-10-CM

## 2021-08-06 NOTE — Progress Notes (Signed)
Pt arrived for flu vaccine Pt given flu vaccine in RD Pt tolerated injection well.

## 2021-08-08 ENCOUNTER — Ambulatory Visit (INDEPENDENT_AMBULATORY_CARE_PROVIDER_SITE_OTHER): Payer: Self-pay | Admitting: Orthopaedic Surgery

## 2021-08-08 ENCOUNTER — Other Ambulatory Visit: Payer: Self-pay

## 2021-08-08 ENCOUNTER — Ambulatory Visit (INDEPENDENT_AMBULATORY_CARE_PROVIDER_SITE_OTHER): Payer: Self-pay

## 2021-08-08 ENCOUNTER — Encounter: Payer: Self-pay | Admitting: Orthopaedic Surgery

## 2021-08-08 ENCOUNTER — Telehealth: Payer: Self-pay | Admitting: Orthopaedic Surgery

## 2021-08-08 ENCOUNTER — Telehealth: Payer: Self-pay | Admitting: Nurse Practitioner

## 2021-08-08 VITALS — BP 120/70 | HR 59 | Temp 97.8°F

## 2021-08-08 DIAGNOSIS — M79602 Pain in left arm: Secondary | ICD-10-CM

## 2021-08-08 NOTE — Telephone Encounter (Signed)
Pt was checking out and checked with his pharmacy and they haven't received the rx yet; pt wants to make sure this gets called in asap please.

## 2021-08-08 NOTE — Telephone Encounter (Signed)
Pt  mailed dental resources.

## 2021-08-08 NOTE — Telephone Encounter (Signed)
Copied from CRM [id number]. Topic: Referral - Request for Referral >> Aug 08, 2021 10:28 AM Jaquita Rector A wrote: Has patient seen PCP for this complaint? No. *If NO, is insurance requiring patient see PCP for this issue before PCP can refer them? Referral for which specialty: Dentist Preferred provider/office: none chosen  Reason for referral: Tooth issues

## 2021-08-08 NOTE — Progress Notes (Signed)
Office Visit Note   Patient: Ruben Fuentes           Date of Birth: 1961/11/28           MRN: [id number] Visit Date: 08/08/2021              Requested by: Claiborne Rigg, NP [address] [place],  [place] [address] PCP: Claiborne Rigg, NP   Assessment & Plan: Visit Diagnoses:  1. Left arm pain     Plan: Impression is chronic left forearm and hand pain and paresthesias likely from an inflammatory process versus cervical spine radiculopathy.  Vital signs taken today which were unremarkable.  We will draw inflammatory markers today and be in touch over the phone with the results.  If the lab work comes back normal, we will likely make a referral for a cervical spine MRI.  In the meantime, discussed starting him on a steroid taper which she is agreeable to.  Call with concerns or questions.  Follow-Up Instructions: Return if symptoms worsen or fail to improve.   Orders:  Orders Placed This Encounter  Procedures   XR Cervical Spine 2 or 3 views   No orders of the defined types were placed in this encounter.     Procedures: No procedures performed   Clinical Data: No additional findings.   Subjective: Chief Complaint  Patient presents with   Left Elbow - Pain    HPI patient is a very pleasant 60 year old gentleman comes in today with left elbow pain.  This is been ongoing for the past several months and is progressively worsened.  He denies any injury or change in activity.  The pain is from the elbow all the way into his hand.  He has constant pain and cramping as well as numbness which radiates into the long, ring and small fingers.  The movement of the elbow seems to aggravate his symptoms.  He has been taking gabapentin and Aleve with mild relief.  He has not had any recent fevers or chills.  No other systemic symptoms.  No personal or family history of autoimmune disease.  No history of gout.  Review of Systems as detailed in HPI.  All others June are  negative.   Objective: Vital Signs: Blood pressure 120/70, O2 sat 97% on room air, pulse 59, oral temperature 97.8 F.  Physical Exam well-developed well-nourished gentleman in no acute distress.  Alert and oriented x3.  Ortho Exam left elbow exam shows no swelling, erythema or warmth.  He does have diffuse tenderness throughout the entire elbow and into the hand.  He has pain with the extremes of elbow and wrist range of motion.  Specialty Comments:  No specialty comments available.  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    PMFS History: Patient Active Problem List   Diagnosis Date Noted   Other hyperlipidemia 01/24/2021   Gastroesophageal reflux disease without esophagitis 10/23/2020   Essential hypertension 10/23/2020   COPD GOLD II 03/09/2018   Primary osteoarthritis involving multiple joints 01/11/2018   Arthritis 01/21/2017   Arthralgia 11/13/2016   Past Medical History:  Diagnosis Date   Arthritis    Bursitis    Hypertension     Family History  Problem Relation Age of Onset   Hypertension Father    Rheum arthritis Sister    Diabetes Brother    Hypertension Mother  Colon cancer Neg Hx     Past Surgical History:  Procedure Laterality Date   FRACTURE SURGERY     collar bones   FRACTURE SURGERY     right ankle   Social History   Occupational History   Not on file  Tobacco Use   Smoking status: Former    Packs/day: 0.10    Years: 30.00    Pack years: 3.00    Types: Cigarettes   Smokeless tobacco: Never  Vaping Use   Vaping Use: Never used  Substance and Sexual Activity   Alcohol use: Yes    Alcohol/week: 2.0 standard drinks    Types: 2 Cans of beer per week    Comment: 2 cans of beer a day.    Drug use: Yes    Types: Marijuana   Sexual activity: Not Currently

## 2021-08-08 NOTE — Telephone Encounter (Signed)
Please call in his meds into pharm. Thanks.

## 2021-08-09 ENCOUNTER — Telehealth: Payer: Self-pay | Admitting: Orthopaedic Surgery

## 2021-08-09 ENCOUNTER — Other Ambulatory Visit: Payer: Self-pay | Admitting: Surgical

## 2021-08-09 ENCOUNTER — Other Ambulatory Visit: Payer: Self-pay

## 2021-08-09 MED ORDER — PREDNISONE 10 MG (21) PO TBPK: ORAL_TABLET | ORAL | 0 refills | Status: DC

## 2021-08-09 NOTE — Telephone Encounter (Signed)
[facility] [address] due to [facility] closing in 15 mins

## 2021-08-09 NOTE — Telephone Encounter (Signed)
Patient's wife called. Says the pharmacy does not have any RXs for his medication. She does not remember the names of the medications that was suppose to be sent in.

## 2021-08-09 NOTE — Telephone Encounter (Signed)
Do you think you can help with this since they are out?

## 2021-08-09 NOTE — Telephone Encounter (Signed)
I called advised and that [facility]

## 2021-08-11 LAB — URIC ACID: Uric Acid, Serum: 6.2 mg/dL (ref 4.0–8.0)

## 2021-08-11 LAB — RHEUMATOID FACTOR: Rheumatoid fact SerPl-aCnc: <14 IU/mL (ref <14)

## 2021-08-11 LAB — ANA: Anti Nuclear Antibody (ANA): NEGATIVE

## 2021-08-11 LAB — SEDIMENTATION RATE: Sed Rate: 6 mm/h (ref 0–20)

## 2021-08-13 ENCOUNTER — Other Ambulatory Visit: Payer: Self-pay | Admitting: Physician Assistant

## 2021-08-13 NOTE — Telephone Encounter (Signed)
noted 

## 2021-08-13 NOTE — Telephone Encounter (Signed)
I am sorry that I forgot to call in.  Ruben Fuentes sent in [place]

## 2021-08-20 ENCOUNTER — Ambulatory Visit: Payer: Self-pay | Attending: Nurse Practitioner | Admitting: Nurse Practitioner

## 2021-08-20 ENCOUNTER — Other Ambulatory Visit: Payer: Self-pay

## 2021-08-20 ENCOUNTER — Encounter: Payer: Self-pay | Admitting: Nurse Practitioner

## 2021-08-20 VITALS — BP 172/81 | HR 52 | Resp 16 | Wt 127.6 lb

## 2021-08-20 DIAGNOSIS — M25522 Pain in left elbow: Secondary | ICD-10-CM

## 2021-08-20 DIAGNOSIS — I10 Essential (primary) hypertension: Secondary | ICD-10-CM

## 2021-08-20 NOTE — Patient Instructions (Signed)
Placed in [facility]  [address] [place] [address]  Ph# [phone] Fax #[phone]. 

## 2021-08-20 NOTE — Progress Notes (Signed)
Assessment & Plan:  Ruben Fuentes was seen today for hypertension.  Diagnoses and all orders for this visit:  Primary hypertension Continue all antihypertensives as prescribed.  Remember to bring in your blood pressure log with you for your follow up appointment.  DASH/Mediterranean Diets are healthier choices for HTN.    Left elbow pain Follow up with ortho.    Patient has been counseled on age-appropriate routine health concerns for screening and prevention. These are reviewed and up-to-date. Referrals have been placed accordingly. Immunizations are up-to-date or declined.    Subjective:   Chief Complaint  Patient presents with   Hypertension   HPI Ruben Fuentes 60 y.o. male presents to office today for follow up to HTN Blood pressure is not well controlled today. He has a past medical history of Arthritis, Bursitis, and Hypertension.    HTN  Endorses medication adherence however he does continue to smoke and smoked cigarettes prior to his office visit this morning.  He did not take his blood pressure medication this morning.  Currently taking amlodipine 10 mg daily, losartan 100 mg daily and chlorthalidone 12.5 mg daily.   BP Readings from Last 3 Encounters:  08/20/21 (!) 172/81  08/08/21 120/70  07/30/21 129/71    Left arm pain Currently being followed by Ortho. Cervical spine imaging pending. He was given a steroid taper which helped significantly reduce his pain.    Review of Systems  Constitutional:  Negative for fever, malaise/fatigue and weight loss.  HENT: Negative.  Negative for nosebleeds.   Eyes: Negative.  Negative for blurred vision, double vision and photophobia.  Respiratory: Negative.  Negative for cough and shortness of breath.   Cardiovascular: Negative.  Negative for chest pain, palpitations and leg swelling.  Gastrointestinal: Negative.  Negative for heartburn, nausea and vomiting.  Musculoskeletal:  Positive for joint pain. Negative for myalgias.   Neurological:  Positive for sensory change. Negative for dizziness, focal weakness, seizures and headaches.  Psychiatric/Behavioral: Negative.  Negative for suicidal ideas.    Past Medical History:  Diagnosis Date   Arthritis    Bursitis    Hypertension     Past Surgical History:  Procedure Laterality Date   FRACTURE SURGERY     collar bones   FRACTURE SURGERY     right ankle    Family History  Problem Relation Age of Onset   Hypertension Father    Rheum arthritis Sister    Diabetes Brother    Hypertension Mother    Colon cancer Neg Hx     Social History Reviewed with no changes to be made today.   Outpatient Medications Prior to Visit  Medication Sig Dispense Refill   albuterol (VENTOLIN HFA) 108 (90 Base) MCG/ACT inhaler Inhale 2 puffs into the lungs every 6 (six) hours as needed for wheezing or shortness of breath. 18 g 0   amLODipine (NORVASC) 10 MG tablet TAKE 1 TABLET (10 MG TOTAL) BY MOUTH DAILY. 90 tablet 0   atorvastatin (LIPITOR) 20 MG tablet TAKE 1 TABLET (20 MG TOTAL) BY MOUTH DAILY. 90 tablet 1   chlorthalidone (HYGROTON) 25 MG tablet Take 0.5 tablets (12.5 mg total) by mouth daily. 45 tablet 1   COVID-19 mRNA Vac-TriS, Pfizer, SUSP injection Inject into the muscle. 0.3 mL 0   diclofenac Sodium (VOLTAREN) 1 % GEL APPLY 4 G TOPICALLY 4 (FOUR) TIMES DAILY. 350 g 1   fluticasone (FLOVENT HFA) 110 MCG/ACT inhaler INHALE 2 PUFFS INTO THE LUNGS DAILY. 12 g 6  gabapentin (NEURONTIN) 300 MG capsule TAKE 1 CAPSULE (300 MG TOTAL) BY MOUTH 3 (THREE) TIMES DAILY. 90 capsule 1   losartan (COZAAR) 100 MG tablet TAKE 1 TABLET (100 MG TOTAL) BY MOUTH DAILY. 90 tablet 0   pantoprazole (PROTONIX) 40 MG tablet TAKE 1 TABLET (40 MG TOTAL) BY MOUTH DAILY. 90 tablet 3   predniSONE (STERAPRED UNI-PAK 21 TAB) 10 MG (21) TBPK tablet Take as directed on package 21 tablet 0   No facility-administered medications prior to visit.    No Known Allergies     Objective:    BP (!)  172/81   Pulse (!) 52   Resp 16   Wt 127 lb 9.6 oz (57.9 kg)   SpO2 100%   BMI 19.98 kg/m  Wt Readings from Last 3 Encounters:  08/20/21 127 lb 9.6 oz (57.9 kg)  07/30/21 125 lb (56.7 kg)  01/24/21 131 lb (59.4 kg)    Physical Exam Vitals and nursing note reviewed.  Constitutional:      Appearance: He is well-developed.  HENT:     Head: Normocephalic and atraumatic.  Cardiovascular:     Rate and Rhythm: Normal rate and regular rhythm.     Heart sounds: Normal heart sounds. No murmur heard.   No friction rub. No gallop.  Pulmonary:     Effort: Pulmonary effort is normal. No tachypnea or respiratory distress.     Breath sounds: Normal breath sounds. No decreased breath sounds, wheezing, rhonchi or rales.  Chest:     Chest wall: No tenderness.  Abdominal:     General: Bowel sounds are normal.     Palpations: Abdomen is soft.  Musculoskeletal:        General: Normal range of motion.     Cervical back: Normal range of motion.     Comments: Wearing support sleeve on left arm  Skin:    General: Skin is warm and dry.  Neurological:     Mental Status: He is alert and oriented to person, place, and time.     Coordination: Coordination normal.  Psychiatric:        Behavior: Behavior normal. Behavior is cooperative.        Thought Content: Thought content normal.        Judgment: Judgment normal.         Patient has been counseled extensively about nutrition and exercise as well as the importance of adherence with medications and regular follow-up. The patient was given clear instructions to go to ER or return to medical center if symptoms don't improve, worsen or new problems develop. The patient verbalized understanding.   Follow-up: Return in about 3 months (around 11/19/2021).   Claiborne Rigg, FNP-BC [facility] and Wellness [place], [place] [phone]   08/20/2021, 12:52 PM

## 2021-09-17 ENCOUNTER — Other Ambulatory Visit: Payer: Self-pay

## 2021-09-17 ENCOUNTER — Other Ambulatory Visit: Payer: Self-pay | Admitting: Nurse Practitioner

## 2021-09-17 DIAGNOSIS — M1990 Unspecified osteoarthritis, unspecified site: Secondary | ICD-10-CM

## 2021-09-17 DIAGNOSIS — I10 Essential (primary) hypertension: Secondary | ICD-10-CM

## 2021-09-17 MED ORDER — GABAPENTIN 300 MG PO CAPS
ORAL_CAPSULE | Freq: Three times a day (TID) | ORAL | 5 refills | Status: DC
  Filled 2021-09-17: qty 90, 30d supply, fill #0
  Filled 2021-11-18: qty 90, 30d supply, fill #1
  Filled 2021-12-30: qty 90, 30d supply, fill #0

## 2021-09-17 MED ORDER — AMLODIPINE BESYLATE 10 MG PO TABS
ORAL_TABLET | Freq: Every day | ORAL | 1 refills | Status: DC
  Filled 2021-09-17: qty 30, 30d supply, fill #0
  Filled 2021-10-21: qty 30, 30d supply, fill #1
  Filled 2021-11-13: qty 30, 30d supply, fill #2
  Filled 2021-12-21: qty 30, 30d supply, fill #3
  Filled 2021-12-23 – 2021-12-30 (×2): qty 30, 30d supply, fill #0

## 2021-09-17 MED ORDER — LOSARTAN POTASSIUM 100 MG PO TABS
ORAL_TABLET | Freq: Every day | ORAL | 1 refills | Status: DC
  Filled 2021-09-17: qty 30, 30d supply, fill #0
  Filled 2021-10-21: qty 30, 30d supply, fill #1
  Filled 2021-11-18: qty 30, 30d supply, fill #2
  Filled 2021-12-21: qty 30, 30d supply, fill #3
  Filled 2021-12-23 – 2021-12-30 (×2): qty 30, 30d supply, fill #0

## 2021-09-17 NOTE — Telephone Encounter (Signed)
Requested Prescriptions  Pending Prescriptions Disp Refills  . amLODipine (NORVASC) 10 MG tablet 90 tablet 1    Sig: TAKE 1 TABLET (10 MG TOTAL) BY MOUTH DAILY.     Cardiovascular:  Calcium Channel Blockers Failed - 09/17/2021  7:39 AM      Failed - Last BP in normal range    BP Readings from Last 1 Encounters:  08/20/21 (!) 172/81         Passed - Valid encounter within last 6 months    Recent Outpatient Visits          4 weeks ago Primary hypertension   [facility] [place], Shea Stakes, NP   1 month ago Primary hypertension   [place] [address] [facility] [place], Shea Stakes, NP   3 months ago Essential hypertension   [facility] [place], Shea Stakes, NP   6 months ago Essential hypertension   [facility] [place], Shea Stakes, NP   9 months ago Essential hypertension   [facility] [place], Shea Stakes, NP      Future Appointments            In 2 months Claiborne Rigg, NP [facility]           . losartan (COZAAR) 100 MG tablet 90 tablet 1    Sig: TAKE 1 TABLET (100 MG TOTAL) BY MOUTH DAILY.     Cardiovascular:  Angiotensin Receptor Blockers Failed - 09/17/2021  7:39 AM      Failed - Last BP in normal range    BP Readings from Last 1 Encounters:  08/20/21 (!) 172/81         Passed - Cr in normal range and within 180 days    Creat  Date Value Ref Range Status  11/04/2016 1.10 0.70 - 1.33 mg/dL Final    Comment:      For patients > or = 60 years of age: The upper reference limit for Creatinine is approximately 13% higher for people identified as African-American.      Creatinine, Ser  Date Value Ref Range Status  05/30/2021 1.04 0.76 - 1.27 mg/dL Final         Passed - K in normal range and within 180 days    Potassium  Date Value Ref Range Status  05/30/2021 4.5 3.5 - 5.2 mmol/L Final         Passed -  Patient is not pregnant      Passed - Valid encounter within last 6 months    Recent Outpatient Visits          4 weeks ago Primary hypertension   [facility] [place], Shea Stakes, NP   1 month ago Primary hypertension   [facility] [place], Shea Stakes, NP   3 months ago Essential hypertension   [facility] [place], Shea Stakes, NP   6 months ago Essential hypertension   [facility] [place], Shea Stakes, NP   9 months ago Essential hypertension   [facility] [place], Shea Stakes, NP      Future Appointments            In 2 months Claiborne Rigg, NP [facility]           .  gabapentin (NEURONTIN) 300 MG capsule 90 capsule 2    Sig: TAKE 1 CAPSULE (300 MG TOTAL) BY MOUTH 3 (THREE) TIMES DAILY.     Neurology: Anticonvulsants - gabapentin Passed - 09/17/2021  7:39 AM      Passed - Valid encounter within last 12 months    Recent Outpatient Visits          4 weeks ago Primary hypertension   [facility] [place], Shea Stakes, NP   1 month ago Primary hypertension   [place] [address] [facility] [place], Shea Stakes, NP   3 months ago Essential hypertension   [place] [address] [facility] [place], Shea Stakes, NP   6 months ago Essential hypertension   [facility] [place], Shea Stakes, NP   9 months ago Essential hypertension   [facility] [place], Shea Stakes, NP      Future Appointments            In 2 months Claiborne Rigg, NP [facility]

## 2021-09-18 ENCOUNTER — Other Ambulatory Visit: Payer: Self-pay

## 2021-09-20 ENCOUNTER — Other Ambulatory Visit: Payer: Self-pay

## 2021-10-10 ENCOUNTER — Ambulatory Visit: Payer: Self-pay | Admitting: Gastroenterology

## 2021-10-21 ENCOUNTER — Other Ambulatory Visit: Payer: Self-pay

## 2021-10-22 ENCOUNTER — Encounter: Payer: Self-pay | Admitting: Gastroenterology

## 2021-10-22 ENCOUNTER — Ambulatory Visit (INDEPENDENT_AMBULATORY_CARE_PROVIDER_SITE_OTHER): Payer: Self-pay | Admitting: Gastroenterology

## 2021-10-22 ENCOUNTER — Other Ambulatory Visit: Payer: Self-pay

## 2021-10-22 VITALS — BP 138/70 | HR 80 | Ht 67.0 in | Wt 127.0 lb

## 2021-10-22 DIAGNOSIS — R195 Other fecal abnormalities: Secondary | ICD-10-CM

## 2021-10-22 NOTE — Progress Notes (Signed)
Ruben Fuentes    [id number]    [birth year]  Primary Care Physician:Fleming, Shea Stakes, NP  Referring Physician: Claiborne Rigg, NP [address] [place],  [place] [address]   Chief complaint: FOBT positive  HPI: 60 yr very pleasant gentleman here for evaluation of positive FOBT. He denies any rectal bleeding or melena.  No abdominal pain, change in bowel habits, decreased appetite or unintentional weight loss He has intermittent gerd symptoms with occasional nocturnal break through. He is chronic smoker.  No dysphagia, nausea or vomiting. Colonoscopy May 22, 2017 for colorectal cancer screening, good prep showed internal hemorrhoids otherwise normal exam.  Recommendation for recall colonoscopy in 10 years.   Outpatient Encounter Medications as of 10/22/2021  Medication Sig   albuterol (VENTOLIN HFA) 108 (90 Base) MCG/ACT inhaler Inhale 2 puffs into the lungs every 6 (six) hours as needed for wheezing or shortness of breath.   amLODipine (NORVASC) 10 MG tablet TAKE 1 TABLET (10 MG TOTAL) BY MOUTH DAILY.   atorvastatin (LIPITOR) 20 MG tablet TAKE 1 TABLET (20 MG TOTAL) BY MOUTH DAILY.   chlorthalidone (HYGROTON) 25 MG tablet Take 0.5 tablets (12.5 mg total) by mouth daily.   COVID-19 mRNA Vac-TriS, Pfizer, SUSP injection Inject into the muscle.   diclofenac Sodium (VOLTAREN) 1 % GEL APPLY 4 G TOPICALLY 4 (FOUR) TIMES DAILY.   fluticasone (FLOVENT HFA) 110 MCG/ACT inhaler INHALE 2 PUFFS INTO THE LUNGS DAILY.   gabapentin (NEURONTIN) 300 MG capsule TAKE 1 CAPSULE (300 MG TOTAL) BY MOUTH 3 (THREE) TIMES DAILY.   losartan (COZAAR) 100 MG tablet TAKE 1 TABLET (100 MG TOTAL) BY MOUTH DAILY.   pantoprazole (PROTONIX) 40 MG tablet TAKE 1 TABLET (40 MG TOTAL) BY MOUTH DAILY.   predniSONE (STERAPRED UNI-PAK 21 TAB) 10 MG (21) TBPK tablet Take as directed on package   No facility-administered encounter medications on file as of 10/22/2021.    Allergies as of 10/22/2021    (No Known Allergies)    Past Medical History:  Diagnosis Date   Arthritis    Bursitis    Hypertension     Past Surgical History:  Procedure Laterality Date   FRACTURE SURGERY     collar bones   FRACTURE SURGERY     right ankle    Family History  Problem Relation Age of Onset   Hypertension Father    Rheum arthritis Sister    Diabetes Brother    Hypertension Mother    Colon cancer Neg Hx     Social History   Socioeconomic History   Marital status: Married    Spouse name: Not on file   Number of children: Not on file   Years of education: Not on file   Highest education level: Not on file  Occupational History   Not on file  Tobacco Use   Smoking status: Former    Packs/day: 0.10    Years: 30.00    Pack years: 3.00    Types: Cigarettes   Smokeless tobacco: Never  Vaping Use   Vaping Use: Never used  Substance and Sexual Activity   Alcohol use: Yes    Alcohol/week: 2.0 standard drinks    Types: 2 Cans of beer per week    Comment: 2 cans of beer a day.    Drug use: Yes    Types: Marijuana   Sexual activity: Not Currently  Other Topics  Concern   Not on file  Social History Narrative   Not on file   Social Determinants of Health   Financial Resource Strain: Not on file  Food Insecurity: Not on file  Transportation Needs: Not on file  Physical Activity: Not on file  Stress: Not on file  Social Connections: Not on file  Intimate Partner Violence: Not on file      Review of systems: All other review of systems negative except as mentioned in the HPI.   Physical Exam: Vitals:   10/22/21 0951  BP: 138/70  Pulse: 80   Body mass index is 19.89 kg/m. Gen:      No acute distress HEENT:  sclera anicteric Abd:      soft, non-tender; no palpable masses, no distension Ext:    No edema Neuro: alert and oriented x 3 Psych: normal mood and affect  Data Reviewed:  Reviewed labs, radiology imaging, old records and pertinent past GI work  up   Assessment and Plan/Recommendations:  60 yr very pleasant gentleman with h/o hypertension , active smoking history, COPD here for evaluation of positive fecal heme occult.  He is up to date with colorectal cancer screening, last colonoscopy in 2018-average risk  Do not recommend repeat colonoscopy at this point and he doesn't need to undergo annual FOBT He is due for recall screening colonoscopy in 2028  Advised patient to limit NSAID and alcohol use GERD: continue daily Pantoprazole and add Pepcid at bedtime as needed Anti reflux measures  If continue to have persistent GERD symptoms can consider EGD for further evaluation  Discussed smoking cessation   The patient was provided an opportunity to ask questions and all were answered. The patient agreed with the plan and demonstrated an understanding of the instructions.  Iona Beard , MD    CC: Claiborne Rigg, NP

## 2021-10-22 NOTE — Patient Instructions (Addendum)
Your recall colonoscopy is 05/09/2027, You will be sent a reminder letter at that time  Continue Protonix  Use Pepcid 20 mg in the evening as needed  Limit NSAIDS and Alcohol    Managing the Challenge of Quitting Smoking Quitting smoking is a physical and mental challenge. You will face cravings, withdrawal symptoms, and temptation. Before quitting, work with your health care provider to make a plan that can help you manage quitting. Preparation can help you quit and keep you from giving in. How to manage lifestyle changes Managing stress Stress can make you want to smoke, and wanting to smoke may cause stress. It is important to find ways to manage your stress. You might try some of the following: Practice relaxation techniques. Breathe slowly and deeply, in through your nose and out through your mouth. Listen to music. Soak in a bath or take a shower. Imagine a peaceful place or vacation. Get some support. Talk with family or friends about your stress. Join a support group. Talk with a counselor or therapist. Get some physical activity. Go for a walk, run, or bike ride. Play a favorite sport. Practice yoga.  Medicines Talk with your health care provider about medicines that might help you deal with cravings and make quitting easier for you. Relationships Social situations can be difficult when you are quitting smoking. To manage this, you can: Avoid parties and other social situations where people might be smoking. Avoid alcohol. Leave right away if you have the urge to smoke. Explain to your family and friends that you are quitting smoking. Ask for support and let them know you might be a bit grumpy. Plan activities where smoking is not an option. General instructions Be aware that many people gain weight after they quit smoking. However, not everyone does. To keep from gaining weight, have a plan in place before you quit and stick to the plan after you quit. Your plan should  include: Having healthy snacks. When you have a craving, it may help to: Eat popcorn, carrots, celery, or other cut vegetables. Chew sugar-free gum. Changing how you eat. Eat small portion sizes at meals. Eat 4-6 small meals throughout the day instead of 1-2 large meals a day. Be mindful when you eat. Do not watch television or do other things that might distract you as you eat. Exercising regularly. Make time to exercise each day. If you do not have time for a long workout, do short bouts of exercise for 5-10 minutes several times a day. Do some form of strengthening exercise, such as weight lifting. Do some exercise that gets your heart beating and causes you to breathe deeply, such as walking fast, running, swimming, or biking. This is very important. Drinking plenty of water or other low-calorie or no-calorie drinks. Drink 6-8 glasses of water daily.  How to recognize withdrawal symptoms Your body and mind may experience discomfort as you try to get used to not having nicotine in your system. These effects are called withdrawal symptoms. They may include: Feeling hungrier than normal. Having trouble concentrating. Feeling irritable or restless. Having trouble sleeping. Feeling depressed. Craving a cigarette. To manage withdrawal symptoms: Avoid places, people, and activities that trigger your cravings. Remember why you want to quit. Get plenty of sleep. Avoid coffee and other caffeinated drinks. These may worsen some of your symptoms. These symptoms may surprise you. But be assured that they are normal to have when quitting smoking. How to manage cravings Come up with a plan for  how to deal with your cravings. The plan should include the following: A definition of the specific situation you want to deal with. An alternative action you will take. A clear idea for how this action will help. The name of someone who might help you with this. Cravings usually last for 5-10 minutes.  Consider taking the following actions to help you with your plan to deal with cravings: Keep your mouth busy. Chew sugar-free gum. Suck on hard candies or a straw. Brush your teeth. Keep your hands and body busy. Change to a different activity right away. Squeeze or play with a ball. Do an activity or a hobby, such as making bead jewelry, practicing needlepoint, or working with wood. Mix up your normal routine. Take a short exercise break. Go for a quick walk or run up and down stairs. Focus on doing something kind or helpful for someone else. Call a friend or family member to talk during a craving. Join a support group. Contact a quitline. Where to find support To get help or find a support group: Call the National Cancer Institute's Smoking Quitline: 1-800-QUIT NOW 905-379-8030) Visit the website of the Substance Abuse and Mental Health Services Administration: SkateOasis.com.pt Text QUIT to SmokefreeTXT: 426834 Where to find more information Visit these websites to find more information on quitting smoking: National Cancer Institute: www.smokefree.gov American Lung Association: www.lung.org American Cancer Society: www.cancer.org Centers for Disease Control and Prevention: FootballExhibition.com.br American Heart Association: www.heart.org Contact a health care provider if: You want to change your plan for quitting. The medicines you are taking are not helping. Your eating feels out of control or you cannot sleep. Get help right away if: You feel depressed or become very anxious. Summary Quitting smoking is a physical and mental challenge. You will face cravings, withdrawal symptoms, and temptation to smoke again. Preparation can help you as you go through these challenges. Try different techniques to manage stress, handle social situations, and prevent weight gain. You can deal with cravings by keeping your mouth busy (such as by chewing gum), keeping your hands and body busy, calling family or  friends, or contacting a quitline for people who want to quit smoking. You can deal with withdrawal symptoms by avoiding places where people smoke, getting plenty of rest, and avoiding drinks with caffeine. This information is not intended to replace advice given to you by your health care provider. Make sure you discuss any questions you have with your health care provider. Document Revised: 08/02/2021 Document Reviewed: 09/13/2019 Elsevier Patient Education  2022 [employer].  If you are age 81 or older, your body mass index should be between 23-30. Your Body mass index is 19.89 kg/m. If this is out of the aforementioned range listed, please consider follow up with your Primary Care Provider.  If you are age 80 or younger, your body mass index should be between 19-25. Your Body mass index is 19.89 kg/m. If this is out of the aformentioned range listed, please consider follow up with your Primary Care Provider.   ________________________________________________________  The [facility] providers would like to encourage you to use [facility] to communicate with providers for non-urgent requests or questions.  Due to long hold times on the telephone, sending your provider a message by [facility] may be a faster and more efficient way to get a response.  Please allow 48 business hours for a response.  Please remember that this is for non-urgent requests.  _______________________________________________________   I appreciate the  opportunity to  care for you  Thank You   Marsa Aris , MD

## 2021-10-25 ENCOUNTER — Ambulatory Visit: Payer: Self-pay | Admitting: Gastroenterology

## 2021-11-06 ENCOUNTER — Encounter: Payer: Self-pay | Admitting: Gastroenterology

## 2021-11-11 ENCOUNTER — Other Ambulatory Visit: Payer: Self-pay

## 2021-11-11 ENCOUNTER — Ambulatory Visit: Payer: Self-pay | Attending: Nurse Practitioner

## 2021-11-13 ENCOUNTER — Other Ambulatory Visit: Payer: Self-pay

## 2021-11-18 ENCOUNTER — Other Ambulatory Visit: Payer: Self-pay

## 2021-11-19 ENCOUNTER — Other Ambulatory Visit: Payer: Self-pay

## 2021-11-22 ENCOUNTER — Ambulatory Visit: Payer: Self-pay | Admitting: Nurse Practitioner

## 2021-11-25 ENCOUNTER — Telehealth: Payer: Self-pay | Admitting: Nurse Practitioner

## 2021-11-25 NOTE — Telephone Encounter (Signed)
Copied from CRM [id number]. Topic: General - Other >> Nov 25, 2021 10:49 AM Pawlus, Maxine Glenn A wrote: Reason for CRM: Pt stated she just missed a call from [facility], requested a call back.

## 2021-11-25 NOTE — Telephone Encounter (Signed)
Copied from CRM [id number]. Topic: General - Other >> Nov 25, 2021  2:37 PM Gaetana Michaelis A wrote: Reason for CRM: The patient's wife has shared that the patient missed their appointment on Friday due to needing to be tested for COVID   The patient's wife would like to be contacted by Mr. Mikle Bosworth when possible

## 2021-12-21 ENCOUNTER — Other Ambulatory Visit: Payer: Self-pay | Admitting: Nurse Practitioner

## 2021-12-21 DIAGNOSIS — E782 Mixed hyperlipidemia: Secondary | ICD-10-CM

## 2021-12-21 MED ORDER — ATORVASTATIN CALCIUM 20 MG PO TABS
ORAL_TABLET | Freq: Every day | ORAL | 1 refills | Status: DC
  Filled 2021-12-21: qty 90, fill #0
  Filled 2021-12-23 – 2021-12-30 (×2): qty 30, 30d supply, fill #0

## 2021-12-21 NOTE — Telephone Encounter (Signed)
Requested Prescriptions  Pending Prescriptions Disp Refills   atorvastatin (LIPITOR) 20 MG tablet 90 tablet 1    Sig: TAKE 1 TABLET (20 MG TOTAL) BY MOUTH DAILY.     Cardiovascular:  Antilipid - Statins Failed - 12/21/2021  4:00 PM      Failed - LDL in normal range and within 360 days    LDL Chol Calc (NIH)  Date Value Ref Range Status  05/30/2021 100 (H) 0 - 99 mg/dL Final         Passed - Total Cholesterol in normal range and within 360 days    Cholesterol, Total  Date Value Ref Range Status  05/30/2021 168 100 - 199 mg/dL Final         Passed - HDL in normal range and within 360 days    HDL  Date Value Ref Range Status  05/30/2021 57 >39 mg/dL Final         Passed - Triglycerides in normal range and within 360 days    Triglycerides  Date Value Ref Range Status  05/30/2021 56 0 - 149 mg/dL Final         Passed - Patient is not pregnant      Passed - Valid encounter within last 12 months    Recent Outpatient Visits          4 months ago Primary hypertension   [facility] [place], Shea Stakes, NP   4 months ago Primary hypertension   [place] [address] [facility] [place], Shea Stakes, NP   6 months ago Essential hypertension   [place] [address] [facility] [place], Shea Stakes, NP   9 months ago Essential hypertension   [place] [facility] [place], Shea Stakes, NP   1 year ago Essential hypertension   [facility] [place], Shea Stakes, NP      Future Appointments            In 4 days Sharon Seller, Marzella Schlein, PA-C [facility]

## 2021-12-23 ENCOUNTER — Other Ambulatory Visit: Payer: Self-pay

## 2021-12-25 ENCOUNTER — Ambulatory Visit: Payer: Self-pay | Admitting: Physician Assistant

## 2021-12-30 ENCOUNTER — Other Ambulatory Visit: Payer: Self-pay

## 2021-12-30 ENCOUNTER — Other Ambulatory Visit: Payer: Self-pay | Admitting: Nurse Practitioner

## 2021-12-30 DIAGNOSIS — J449 Chronic obstructive pulmonary disease, unspecified: Secondary | ICD-10-CM

## 2021-12-30 DIAGNOSIS — F17200 Nicotine dependence, unspecified, uncomplicated: Secondary | ICD-10-CM

## 2021-12-30 MED FILL — Fluticasone Propionate HFA Inhal Aer 110 MCG/ACT: RESPIRATORY_TRACT | 30 days supply | Qty: 12 | Fill #0 | Status: AC

## 2021-12-30 MED FILL — Diclofenac Sodium Gel 1% (1.16% Diethylamine Equiv): CUTANEOUS | 6 days supply | Qty: 100 | Fill #0 | Status: AC

## 2022-01-15 ENCOUNTER — Encounter: Payer: Self-pay | Admitting: Physician Assistant

## 2022-01-15 ENCOUNTER — Ambulatory Visit: Payer: Self-pay | Attending: Physician Assistant | Admitting: Physician Assistant

## 2022-01-15 ENCOUNTER — Other Ambulatory Visit: Payer: Self-pay

## 2022-01-15 VITALS — BP 118/73 | HR 75 | Resp 16 | Wt 132.6 lb

## 2022-01-15 DIAGNOSIS — I10 Essential (primary) hypertension: Secondary | ICD-10-CM

## 2022-01-15 DIAGNOSIS — M1990 Unspecified osteoarthritis, unspecified site: Secondary | ICD-10-CM

## 2022-01-15 DIAGNOSIS — E7849 Other hyperlipidemia: Secondary | ICD-10-CM

## 2022-01-15 DIAGNOSIS — R7309 Other abnormal glucose: Secondary | ICD-10-CM

## 2022-01-15 DIAGNOSIS — F17200 Nicotine dependence, unspecified, uncomplicated: Secondary | ICD-10-CM

## 2022-01-15 DIAGNOSIS — E782 Mixed hyperlipidemia: Secondary | ICD-10-CM

## 2022-01-15 DIAGNOSIS — J449 Chronic obstructive pulmonary disease, unspecified: Secondary | ICD-10-CM

## 2022-01-15 MED ORDER — DICLOFENAC SODIUM 1 % EX GEL
4.0000 g | Freq: Four times a day (QID) | CUTANEOUS | 3 refills | Status: AC
  Filled 2022-01-15: qty 300, 18d supply, fill #0
  Filled 2022-01-23: qty 100, 6d supply, fill #0
  Filled 2022-09-30: qty 100, 6d supply, fill #1

## 2022-01-15 MED ORDER — LOSARTAN POTASSIUM 100 MG PO TABS
ORAL_TABLET | Freq: Every day | ORAL | 1 refills | Status: DC
  Filled 2022-01-15: qty 90, fill #0
  Filled 2022-01-23: qty 30, 30d supply, fill #0
  Filled 2022-02-21: qty 30, 30d supply, fill #1
  Filled 2022-03-27: qty 30, 30d supply, fill #2
  Filled 2022-04-25: qty 30, 30d supply, fill #3
  Filled 2022-05-22: qty 30, 30d supply, fill #4

## 2022-01-15 MED ORDER — ALBUTEROL SULFATE HFA 108 (90 BASE) MCG/ACT IN AERS
2.0000 | INHALATION_SPRAY | Freq: Four times a day (QID) | RESPIRATORY_TRACT | 2 refills | Status: DC | PRN
  Filled 2022-01-15 – 2022-09-30 (×2): qty 18, 25d supply, fill #0

## 2022-01-15 MED ORDER — FLUTICASONE PROPIONATE HFA 110 MCG/ACT IN AERO
2.0000 | INHALATION_SPRAY | Freq: Every day | RESPIRATORY_TRACT | 6 refills | Status: DC
  Filled 2022-01-15 – 2022-01-23 (×2): qty 12, 30d supply, fill #0
  Filled 2022-09-30: qty 12, 30d supply, fill #1

## 2022-01-15 MED ORDER — GABAPENTIN 300 MG PO CAPS
ORAL_CAPSULE | Freq: Three times a day (TID) | ORAL | 1 refills | Status: DC
  Filled 2022-01-15: qty 270, fill #0
  Filled 2022-02-21: qty 90, 30d supply, fill #0
  Filled 2022-04-06: qty 90, 30d supply, fill #1
  Filled 2022-05-22: qty 90, 30d supply, fill #2
  Filled 2022-07-09: qty 90, 30d supply, fill #3
  Filled 2022-08-20: qty 90, 30d supply, fill #4
  Filled 2022-09-30: qty 90, 30d supply, fill #5

## 2022-01-15 MED ORDER — ATORVASTATIN CALCIUM 20 MG PO TABS
ORAL_TABLET | Freq: Every day | ORAL | 1 refills | Status: DC
  Filled 2022-01-15: qty 90, fill #0
  Filled 2022-01-23: qty 30, 30d supply, fill #0
  Filled 2022-02-21: qty 30, 30d supply, fill #1
  Filled 2022-03-27: qty 90, 90d supply, fill #2

## 2022-01-15 MED ORDER — CHLORTHALIDONE 25 MG PO TABS
12.5000 mg | ORAL_TABLET | Freq: Every day | ORAL | 1 refills | Status: DC
  Filled 2022-01-15: qty 45, 90d supply, fill #0
  Filled 2022-01-23: qty 15, 30d supply, fill #0
  Filled 2022-02-21: qty 45, 90d supply, fill #1
  Filled 2022-05-22: qty 15, 30d supply, fill #2
  Filled 2022-06-27: qty 15, 30d supply, fill #3

## 2022-01-15 MED ORDER — AMLODIPINE BESYLATE 10 MG PO TABS
ORAL_TABLET | Freq: Every day | ORAL | 1 refills | Status: DC
  Filled 2022-01-15: qty 90, fill #0
  Filled 2022-01-23: qty 30, 30d supply, fill #0
  Filled 2022-02-21: qty 90, 90d supply, fill #1
  Filled 2022-05-22: qty 30, 30d supply, fill #2

## 2022-01-15 NOTE — Progress Notes (Signed)
Patient ID: Ruben Fuentes, male   DOB: 08/08/1961, 61 y.o.   MRN: [id number]   Ruben Fuentes, is a 61 y.o. male  ZLD:[id number]  JQZ:[id number]  DOB - 16-Apr-1961  Chief Complaint  Patient presents with   Hypertension       Subjective:   Ruben Fuentes is a 61 y.o. male here today for a follow up visit and and med RF.  He does not have any new issues or concerns.  He denies CP/SOB.  He is compliant with meds  No problems updated.  ALLERGIES: No Known Allergies  PAST MEDICAL HISTORY: Past Medical History:  Diagnosis Date   Arthritis    Bursitis    Hypertension     MEDICATIONS AT HOME: Prior to Admission medications   Medication Sig Start Date End Date Taking? Authorizing Provider  albuterol (VENTOLIN HFA) 108 (90 Base) MCG/ACT inhaler Inhale 2 puffs into the lungs every 6 (six) hours as needed for wheezing or shortness of breath. 01/15/22   Anders Simmonds, PA-C  amLODipine (NORVASC) 10 MG tablet TAKE 1 TABLET (10 MG TOTAL) BY MOUTH DAILY. 01/15/22 01/15/23  Anders Simmonds, PA-C  atorvastatin (LIPITOR) 20 MG tablet TAKE 1 TABLET (20 MG TOTAL) BY MOUTH DAILY. 01/15/22 01/15/23  Anders Simmonds, PA-C  chlorthalidone (HYGROTON) 25 MG tablet Take 0.5 tablets (12.5 mg total) by mouth daily. 01/15/22 04/15/22  Anders Simmonds, PA-C  COVID-19 mRNA Vac-TriS, Pfizer, SUSP injection Inject into the muscle. 04/03/21   Judyann Munson, MD  diclofenac Sodium (VOLTAREN) 1 % GEL APPLY 4 G TOPICALLY 4 (FOUR) TIMES DAILY. 01/15/22 01/15/23  Anders Simmonds, PA-C  fluticasone (FLOVENT HFA) 110 MCG/ACT inhaler INHALE 2 PUFFS INTO THE LUNGS DAILY. 01/15/22 01/15/23  Anders Simmonds, PA-C  gabapentin (NEURONTIN) 300 MG capsule TAKE 1 CAPSULE (300 MG TOTAL) BY MOUTH 3 (THREE) TIMES DAILY. 01/15/22 01/15/23  Anders Simmonds, PA-C  losartan (COZAAR) 100 MG tablet TAKE 1 TABLET (100 MG TOTAL) BY MOUTH DAILY. 01/15/22 01/15/23  Anders Simmonds, PA-C  pantoprazole (PROTONIX) 40 MG tablet TAKE 1 TABLET (40 MG TOTAL) BY  MOUTH DAILY. 05/29/21 05/29/22  Claiborne Rigg, NP  predniSONE (STERAPRED UNI-PAK 21 TAB) 10 MG (21) TBPK tablet Take as directed on package 08/09/21   Magnant, Charles L, PA-C    ROS: Neg HEENT Neg resp Neg cardiac Neg GI Neg GU Neg MS Neg psych Neg neuro  Objective:   Vitals:   01/15/22 1038  BP: 118/73  Pulse: 75  Resp: 16  SpO2: 97%  Weight: 132 lb 9.6 oz (60.1 kg)   Exam General appearance : Awake, alert, not in any distress. Speech Clear. Not toxic looking, thin HEENT: Atraumatic and Normocephalic Neck: Supple, no JVD. No cervical lymphadenopathy.  Chest: Good air entry bilaterally, CTAB.  No rales/rhonchi/wheezing CVS: S1 S2 regular, no murmurs.  Extremities: B/L Lower Ext shows no edema, both legs are warm to touch Neurology: Awake alert, and oriented X 3, CN II-XII intact, Non focal Skin: No Rash  Data Review No results found for: HGBA1C  Assessment & Plan   1. Essential hypertension controlled - Comprehensive metabolic panel - CBC with Differential/Platelet - chlorthalidone (HYGROTON) 25 MG tablet; Take 0.5 tablets (12.5 mg total) by mouth daily.  Dispense: 45 tablet; Refill: 1 - amLODipine (NORVASC) 10 MG tablet; TAKE 1 TABLET (10 MG TOTAL) BY MOUTH DAILY.  Dispense: 90 tablet; Refill: 1 - losartan (COZAAR) 100 MG tablet; TAKE 1 TABLET (100 MG TOTAL) BY MOUTH  DAILY.  Dispense: 90 tablet; Refill: 1  2. Other hyperlipidemia - Lipid panel  3. Elevated glucose - Hemoglobin A1c  4. Hyperlipidemia, mixed - atorvastatin (LIPITOR) 20 MG tablet; TAKE 1 TABLET (20 MG TOTAL) BY MOUTH DAILY.  Dispense: 90 tablet; Refill: 1  5. Arthritis stable - diclofenac Sodium (VOLTAREN) 1 % GEL; APPLY 4 G TOPICALLY 4 (FOUR) TIMES DAILY.  Dispense: 350 g; Refill: 3 - gabapentin (NEURONTIN) 300 MG capsule; TAKE 1 CAPSULE (300 MG TOTAL) BY MOUTH 3 (THREE) TIMES DAILY.  Dispense: 270 capsule; Refill: 1  6. Tobacco dependence Smoking and dangers of nicotine have been  discussed at length. Long term health consequences of smoking reviewed in detail.  Methods for helping with cessation have been reviewed.  Patient expresses understanding. - albuterol (VENTOLIN HFA) 108 (90 Base) MCG/ACT inhaler; Inhale 2 puffs into the lungs every 6 (six) hours as needed for wheezing or shortness of breath.  Dispense: 18 g; Refill: 2  7. COPD GOLD II - albuterol (VENTOLIN HFA) 108 (90 Base) MCG/ACT inhaler; Inhale 2 puffs into the lungs every 6 (six) hours as needed for wheezing or shortness of breath.  Dispense: 18 g; Refill: 2 - fluticasone (FLOVENT HFA) 110 MCG/ACT inhaler; INHALE 2 PUFFS INTO THE LUNGS DAILY.  Dispense: 12 g; Refill: 6    Patient have been counseled extensively about nutrition and exercise. Other issues discussed during this visit include: low cholesterol diet, weight control and daily exercise, foot care, annual eye examinations at Ophthalmology, importance of adherence with medications and regular follow-up. We also discussed long term complications of uncontrolled diabetes and hypertension.   Return in about 6 months (around 07/15/2022) for PCP for chronic conditions.  The patient was given clear instructions to go to ER or return to medical center if symptoms don't improve, worsen or new problems develop. The patient verbalized understanding. The patient was told to call to get lab results if they haven't heard anything in the next week.      Georgian Co, PA-C [facility] [place], [place] [phone]   01/15/2022, 10:47 AM

## 2022-01-16 LAB — COMPREHENSIVE METABOLIC PANEL
ALT: 25 IU/L (ref 0–44)
AST: 29 IU/L (ref 0–40)
Albumin/Globulin Ratio: 1.7 (ref 1.2–2.2)
Albumin: 4.8 g/dL (ref 3.8–4.9)
Alkaline Phosphatase: 111 IU/L (ref 44–121)
BUN/Creatinine Ratio: 9 — ABNORMAL LOW (ref 10–24)
BUN: 13 mg/dL (ref 8–27)
Bilirubin Total: 0.3 mg/dL (ref 0.0–1.2)
CO2: 23 mmol/L (ref 20–29)
Calcium: 9.3 mg/dL (ref 8.6–10.2)
Chloride: 96 mmol/L (ref 96–106)
Creatinine, Ser: 1.5 mg/dL — ABNORMAL HIGH (ref 0.76–1.27)
Globulin, Total: 2.9 g/dL (ref 1.5–4.5)
Glucose: 80 mg/dL (ref 70–99)
Potassium: 4.7 mmol/L (ref 3.5–5.2)
Sodium: 134 mmol/L (ref 134–144)
Total Protein: 7.7 g/dL (ref 6.0–8.5)
eGFR: 53 mL/min/{1.73_m2} — ABNORMAL LOW (ref >59)

## 2022-01-16 LAB — CBC WITH DIFFERENTIAL/PLATELET
Basophils Absolute: 0.1 10*3/uL (ref 0.0–0.2)
Basos: 1 %
EOS (ABSOLUTE): 0.1 10*3/uL (ref 0.0–0.4)
Eos: 2 %
Hematocrit: 39.5 % (ref 37.5–51.0)
Hemoglobin: 13.1 g/dL (ref 13.0–17.7)
Immature Grans (Abs): 0 10*3/uL (ref 0.0–0.1)
Immature Granulocytes: 0 %
Lymphocytes Absolute: 2.4 10*3/uL (ref 0.7–3.1)
Lymphs: 38 %
MCH: 28.1 pg (ref 26.6–33.0)
MCHC: 33.2 g/dL (ref 31.5–35.7)
MCV: 85 fL (ref 79–97)
Monocytes Absolute: 1.1 10*3/uL — ABNORMAL HIGH (ref 0.1–0.9)
Monocytes: 17 %
Neutrophils Absolute: 2.7 10*3/uL (ref 1.4–7.0)
Neutrophils: 42 %
Platelets: 303 10*3/uL (ref 150–450)
RBC: 4.66 x10E6/uL (ref 4.14–5.80)
RDW: 12.5 % (ref 11.6–15.4)
WBC: 6.2 10*3/uL (ref 3.4–10.8)

## 2022-01-16 LAB — LIPID PANEL
Chol/HDL Ratio: 3.1 ratio (ref 0.0–5.0)
Cholesterol, Total: 173 mg/dL (ref 100–199)
HDL: 56 mg/dL (ref >39)
LDL Chol Calc (NIH): 102 mg/dL — ABNORMAL HIGH (ref 0–99)
Triglycerides: 81 mg/dL (ref 0–149)
VLDL Cholesterol Cal: 15 mg/dL (ref 5–40)

## 2022-01-16 LAB — HEMOGLOBIN A1C
Est. average glucose Bld gHb Est-mCnc: 117 mg/dL
Hgb A1c MFr Bld: 5.7 % — ABNORMAL HIGH (ref 4.8–5.6)

## 2022-01-23 ENCOUNTER — Other Ambulatory Visit: Payer: Self-pay

## 2022-02-21 ENCOUNTER — Other Ambulatory Visit: Payer: Self-pay

## 2022-02-24 ENCOUNTER — Other Ambulatory Visit: Payer: Self-pay

## 2022-02-25 ENCOUNTER — Other Ambulatory Visit: Payer: Self-pay

## 2022-03-06 ENCOUNTER — Encounter: Payer: Self-pay | Admitting: Internal Medicine

## 2022-03-06 ENCOUNTER — Ambulatory Visit (INDEPENDENT_AMBULATORY_CARE_PROVIDER_SITE_OTHER): Payer: Self-pay | Admitting: Internal Medicine

## 2022-03-06 VITALS — BP 115/61 | HR 62 | Ht 66.0 in | Wt 128.0 lb

## 2022-03-06 DIAGNOSIS — I10 Essential (primary) hypertension: Secondary | ICD-10-CM

## 2022-03-06 DIAGNOSIS — I739 Peripheral vascular disease, unspecified: Secondary | ICD-10-CM

## 2022-03-06 DIAGNOSIS — E782 Mixed hyperlipidemia: Secondary | ICD-10-CM

## 2022-03-06 DIAGNOSIS — Z720 Tobacco use: Secondary | ICD-10-CM

## 2022-03-06 NOTE — Progress Notes (Signed)
?Cardiology Office Note:   ? ?Date:  03/06/2022  ? ?ID:  TERI UVA, DOB 1961/03/26, MRN [id number] ? ?PCP:  Gildardo Pounds, NP  ?[facility] Cardiologist:  Rudean Haskell MD ?[facility] Electrophysiologist:  None  ? ?CC: Follow up reflux and smoking cessation ? ?History of Present Illness:   ? ?Ruben Fuentes is a 61 y.o. male with a hx of HTN, COPD, tobacco abuse (former) who presented wiith asymptomatic sinus bradycardia and GERD in 2021. ?2022:  Patient has started PPI and stopped smoking, started back up intermittently. ? ?Patient notes that he is doing well.   ?He notes some aches and pains: he had right leg pain aches and exertion that improves with rest.  Also has hip and foot pain.  No skin lesions. No rest pain. ?There are no interval hospital/ED visit.   ? ?No chest pain or pressure .  No SOB/DOE and no PND/Orthopnea.  No weight gain or leg swelling.  No palpitations or syncope . ? ? ?Past Medical History:  ?Diagnosis Date  ? Arthritis   ? Bursitis   ? Hypertension   ? ? ?Past Surgical History:  ?Procedure Laterality Date  ? FRACTURE SURGERY    ? collar bones  ? FRACTURE SURGERY    ? right ankle  ? ? ?Current Medications: ?Current Meds  ?Medication Sig  ? albuterol (VENTOLIN HFA) 108 (90 Base) MCG/ACT inhaler Inhale 2 puffs into the lungs every 6 (six) hours as needed for wheezing or shortness of breath.  ? amLODipine (NORVASC) 10 MG tablet TAKE 1 TABLET (10 MG TOTAL) BY MOUTH DAILY.  ? atorvastatin (LIPITOR) 20 MG tablet TAKE 1 TABLET (20 MG TOTAL) BY MOUTH DAILY.  ? chlorthalidone (HYGROTON) 25 MG tablet Take 0.5 tablets (12.5 mg total) by mouth daily.  ? diclofenac Sodium (VOLTAREN) 1 % GEL APPLY 4 G TOPICALLY 4 (FOUR) TIMES DAILY.  ? fluticasone (FLOVENT HFA) 110 MCG/ACT inhaler INHALE 2 PUFFS INTO THE LUNGS DAILY.  ? gabapentin (NEURONTIN) 300 MG capsule TAKE 1 CAPSULE (300 MG TOTAL) BY MOUTH 3 (THREE) TIMES DAILY.  ? losartan (COZAAR) 100 MG tablet TAKE 1 TABLET (100 MG TOTAL) BY  MOUTH DAILY.  ? pantoprazole (PROTONIX) 40 MG tablet TAKE 1 TABLET (40 MG TOTAL) BY MOUTH DAILY.  ? [DISCONTINUED] COVID-19 mRNA Vac-TriS, Pfizer, SUSP injection Inject into the muscle.  ? [DISCONTINUED] predniSONE (STERAPRED UNI-PAK 21 TAB) 10 MG (21) TBPK tablet Take as directed on package  ?  ? ?Allergies:   Patient has no known allergies.  ? ?Social History  ? ?Socioeconomic History  ? Marital status: Married  ?  Spouse name: Wilkins Mance  ? Number of children: 2  ? Years of education: Not on file  ? Highest education level: Not on file  ?Occupational History  ? Not on file  ?Tobacco Use  ? Smoking status: Every Day  ?  Packs/day: 0.10  ?  Years: 30.00  ?  Pack years: 3.00  ?  Types: Cigarettes  ? Smokeless tobacco: Never  ?Vaping Use  ? Vaping Use: Never used  ?Substance and Sexual Activity  ? Alcohol use: Yes  ?  Alcohol/week: 2.0 standard drinks  ?  Types: 2 Cans of beer per week  ?  Comment: up to 5 beers a day  ? Drug use: Yes  ?  Types: Marijuana  ?  Comment: 2 cigs of it a day  ? Sexual activity: Not Currently  ?Other Topics Concern  ? Not  on file  ?Social History Narrative  ? Not on file  ? ?Social Determinants of Health  ? ?Financial Resource Strain: Not on file  ?Food Insecurity: Not on file  ?Transportation Needs: Not on file  ?Physical Activity: Not on file  ?Stress: Not on file  ?Social Connections: Not on file  ?  ?Social:  Ruben Fuentes is his wife who is former Youth worker in [place]; Former boxer ? ?Family History: ?The patient's family history includes Diabetes in his brother; Hypertension in his father and mother; Rheum arthritis in his sister. There is no history of Colon cancer or Esophageal cancer. ?History of coronary artery disease notable for father. ?History of heart failure notable for no members. ?History of arrhythmia notable for no members. ? ?ROS:   ?Please see the history of present illness.    ?All other systems reviewed and are negative. ? ?EKGs/Labs/Other Studies Reviewed:   ? ?The  following studies were reviewed today: ? ?EKG:   ?03/06/22: SR Rate 62 ?10/23/2020 sinus bradycardia rate 53 without evidence of high grade AV block ?07/16/20:  Sinus bradycardia 49 PR 160 ? ?Recent Labs: ?01/15/2022: ALT 25; BUN 13; Creatinine, Ser 1.50; Hemoglobin 13.1; Platelets 303; Potassium 4.7; Sodium 134  ?Recent Lipid Panel ?   ?Component Value Date/Time  ? CHOL 173 01/15/2022 1051  ? TRIG 81 01/15/2022 1051  ? HDL 56 01/15/2022 1051  ? CHOLHDL 3.1 01/15/2022 1051  ? LDLCALC 102 (H) 01/15/2022 1051  ? ? ? ?Physical Exam:   ? ?VS:  BP 115/61   Pulse 62   Ht 5\' 6"  (1.676 m)   Wt 128 lb (58.1 kg)   SpO2 97%   BMI 20.66 kg/m?    ? ?Wt Readings from Last 3 Encounters:  ?03/06/22 128 lb (58.1 kg)  ?01/15/22 132 lb 9.6 oz (60.1 kg)  ?10/22/21 127 lb (57.6 kg)  ?  ?GEN: Well nourished, well developed in no acute distress ?HEENT: Normal ?NECK: No JVD ?LYMPHATICS: No lymphadenopathy ?CARDIAC: RRR, no murmurs, rubs, gallops, +2 Radial pulses, popliteal pulses, and pedal pulses ?RESPIRATORY:  Clear to auscultation without rales, wheezing or rhonchi  ?ABDOMEN: Soft, non-tender, non-distended ?MUSCULOSKELETAL:  No edema; No deformity  ?SKIN: Warm and dry ?NEUROLOGIC:  Alert and oriented x 3 ?PSYCHIATRIC:  Normal affect  ? ?ASSESSMENT:   ? ?1. Right leg claudication ([place])   ?2. Essential hypertension   ?3. Mixed hyperlipidemia   ?4. Tobacco abuse   ? ? ?PLAN:   ? 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  ?Tobacco abuse ?- we discussed cessation using a CBT framework and discussed force of habit related to alcohol consumption (5 cigarettes a week; when he drinks beer ? ?One year with me unless new sx or  markedly positive scan ? ? ?Medication Adjustments/Labs and Tests Ordered: ?Current medicines are reviewed at length with the patient today.  Concerns regarding medicines are outlined above.  ?Orders Placed This Encounter  ?Procedures  ? EKG 12-Lead  ? VAS [name] LOWER EXTREMITY ARTERIAL DUPLEX  ? VAS [name] ABI WITH/WO TBI  ? ?No orders of the defined types were placed in this encounter. ? ? ?Patient Instructions  ?  Medication Instructions:  ?Your physician recommends that you continue on your current medications as directed. Please refer to the Current Medication list given to you today. ? ?*If you need a refill on your cardiac medications before your next appointment, please call your pharmacy* ? ? ?Lab Work: ?NONE ?If you have labs (blood work) drawn today and your tests are completely normal, you will receive your results only by: ?MyChart Message (if you have MyChart) OR ?A paper copy in the mail ?If you have any lab test that is abnormal or we need to change your treatment, we will call you to review the results. ? ? ?Testing/Procedures: ?Your physician has requested that you have an ankle brachial index (ABI). During this test an ultrasound and blood pressure cuff are used to evaluate the arteries that supply the arms and legs with blood. Allow thirty minutes for this exam. There are no restrictions or special instructions. ?Your physician has requested that you have a lower right extremity arterial duplex. This test is an ultrasound of the arteries in the legs. It looks at arterial blood flow in the right leg. There are no restrictions or special instructions  ? ? ?Follow-Up: ?At [facility], you and your health needs are our priority.  As part of our continuing mission to provide you with exceptional heart care, we have created designated Provider Care Teams.  These Care Teams include your primary Cardiologist (physician) and Advanced Practice Providers (APPs -  Physician Assistants and Nurse Practitioners) who  all work together to provide you with the care you need, when you need it. ? ?We recommend signing up for the patient portal called "MyChart".  Sign up information is provided on this After Visit Summary.  [place]

## 2022-03-06 NOTE — Patient Instructions (Signed)
Medication Instructions:  ?Your physician recommends that you continue on your current medications as directed. Please refer to the Current Medication list given to you today. ? ?*If you need a refill on your cardiac medications before your next appointment, please call your pharmacy* ? ? ?Lab Work: ?NONE ?If you have labs (blood work) drawn today and your tests are completely normal, you will receive your results only by: ?MyChart Message (if you have MyChart) OR ?A paper copy in the mail ?If you have any lab test that is abnormal or we need to change your treatment, we will call you to review the results. ? ? ?Testing/Procedures: ?Your physician has requested that you have an ankle brachial index (ABI). During this test an ultrasound and blood pressure cuff are used to evaluate the arteries that supply the arms and legs with blood. Allow thirty minutes for this exam. There are no restrictions or special instructions. ?Your physician has requested that you have a lower right extremity arterial duplex. This test is an ultrasound of the arteries in the legs. It looks at arterial blood flow in the right leg. There are no restrictions or special instructions  ? ? ?Follow-Up: ?At [facility], you and your health needs are our priority.  As part of our continuing mission to provide you with exceptional heart care, we have created designated Provider Care Teams.  These Care Teams include your primary Cardiologist (physician) and Advanced Practice Providers (APPs -  Physician Assistants and Nurse Practitioners) who all work together to provide you with the care you need, when you need it. ? ?We recommend signing up for the patient portal called "MyChart".  Sign up information is provided on this After Visit Summary.  MyChart is used to connect with patients for Virtual Visits (Telemedicine).  Patients are able to view lab/test results, encounter notes, upcoming appointments, etc.  Non-urgent messages can be sent to your  provider as well.   ?To learn more about what you can do with MyChart, go to [email or web address].   ? ?Your next appointment:   ?1 year(s) ? ?The format for your next appointment:   ?In Person ? ?Provider:   ?Christell Constant, MD   ?

## 2022-03-28 ENCOUNTER — Other Ambulatory Visit: Payer: Self-pay

## 2022-04-02 ENCOUNTER — Ambulatory Visit (HOSPITAL_COMMUNITY)
Admission: RE | Admit: 2022-04-02 | Discharge: 2022-04-02 | Disposition: A | Discharge status: Home / Self Care | Payer: Self-pay | Source: Ambulatory Visit | Attending: Cardiovascular Disease | Admitting: Cardiovascular Disease

## 2022-04-02 DIAGNOSIS — I739 Peripheral vascular disease, unspecified: Secondary | ICD-10-CM

## 2022-04-04 ENCOUNTER — Telehealth: Payer: Self-pay

## 2022-04-04 DIAGNOSIS — I739 Peripheral vascular disease, unspecified: Secondary | ICD-10-CM

## 2022-04-04 NOTE — Telephone Encounter (Signed)
-----   Message from Christell Constant, MD sent at 04/03/2022  9:52 PM EDT ----- ?Results: ?Symptomatic PAD ?Plan: ?Will refer to Dr. Allyson Sabal for potential procedural intervention, if not amenable to endovascular intervention will then send to [facility] ? ?Christell Constant, MD ? ?

## 2022-04-04 NOTE — Telephone Encounter (Signed)
The patient spouse has been notified (ok per DPR) of the result and verbalized understanding.  All questions (if any) were answered. ?Precious Gilding, RN 04/04/2022 3:36 PM   ?

## 2022-04-07 ENCOUNTER — Other Ambulatory Visit: Payer: Self-pay

## 2022-04-15 ENCOUNTER — Ambulatory Visit (INDEPENDENT_AMBULATORY_CARE_PROVIDER_SITE_OTHER): Payer: Self-pay | Admitting: Cardiovascular Disease

## 2022-04-15 ENCOUNTER — Encounter: Payer: Self-pay | Admitting: Cardiovascular Disease

## 2022-04-15 DIAGNOSIS — I739 Peripheral vascular disease, unspecified: Secondary | ICD-10-CM

## 2022-04-15 LAB — CBC
Hematocrit: 35.6 % — ABNORMAL LOW (ref 37.5–51.0)
Hemoglobin: 12.3 g/dL — ABNORMAL LOW (ref 13.0–17.7)
MCH: 28.1 pg (ref 26.6–33.0)
MCHC: 34.6 g/dL (ref 31.5–35.7)
MCV: 82 fL (ref 79–97)
Platelets: 274 10*3/uL (ref 150–450)
RBC: 4.37 x10E6/uL (ref 4.14–5.80)
RDW: 14 % (ref 11.6–15.4)
WBC: 5 10*3/uL (ref 3.4–10.8)

## 2022-04-15 LAB — BASIC METABOLIC PANEL
BUN/Creatinine Ratio: 12 (ref 10–24)
BUN: 14 mg/dL (ref 8–27)
CO2: 24 mmol/L (ref 20–29)
Calcium: 9.5 mg/dL (ref 8.6–10.2)
Chloride: 97 mmol/L (ref 96–106)
Creatinine, Ser: 1.19 mg/dL (ref 0.76–1.27)
Glucose: 99 mg/dL (ref 70–99)
Potassium: 4.1 mmol/L (ref 3.5–5.2)
Sodium: 135 mmol/L (ref 134–144)
eGFR: 70 mL/min/{1.73_m2} (ref >59)

## 2022-04-15 NOTE — Patient Instructions (Addendum)
Medication Instructions:  ?Your physician recommends that you continue on your current medications as directed. Please refer to the Current Medication list given to you today. ? ?*If you need a refill on your cardiac medications before your next appointment, please call your pharmacy* ? ? ?Lab Work: ?Your physician recommends that you have labs drawn today: BMET & CBC ? ?If you have labs (blood work) drawn today and your tests are completely normal, you will receive your results only by: ?MyChart Message (if you have MyChart) OR ?A paper copy in the mail ?If you have any lab test that is abnormal or we need to change your treatment, we will call you to review the results. ? ? ?Testing/Procedures: ?Your physician has requested that you have a carotid duplex. This test is an ultrasound of the carotid arteries in your neck. It looks at blood flow through these arteries that supply the brain with blood. Allow one hour for this exam. There are no restrictions or special instructions. ? ?Your physician has requested that you have a lower extremity arterial duplex. This test is an ultrasound of the arteries in the legs. It looks at arterial blood flow in the legs. Allow one hour for Lower  Arterial scans. There are no restrictions or special instructions ? ?Your physician has requested that you have an ankle brachial index (ABI). During this test an ultrasound and blood pressure cuff are used to evaluate the arteries that supply the arms and legs with blood. Allow thirty minutes for this exam. There are no restrictions or special instructions. ?To be done 1-2 weeks after PV procedure (5/22). These will be done at [address] [facility] [address] [facility]. ? ? ?Follow-Up: ?At [facility], you and your health needs are our priority.  As part of our continuing mission to provide you with exceptional heart care, we have created designated Provider Care Teams.  These Care Teams include your primary Cardiologist (physician) and Advanced  Practice Providers (APPs -  Physician Assistants and Nurse Practitioners) who all work together to provide you with the care you need, when you need it. ? ?We recommend signing up for the patient portal called "MyChart".  Sign up information is provided on this After Visit Summary.  MyChart is used to connect with patients for Virtual Visits (Telemedicine).  Patients are able to view lab/test results, encounter notes, upcoming appointments, etc.  Non-urgent messages can be sent to your provider as well.   ?To learn more about what you can do with MyChart, go to [email or web address].   ? ?Your next appointment:   ?2-3 week(s) after PV procedure ? ?The format for your next appointment:   ?In Person ? ?Provider:   ?Nanetta Batty, MD ? ? ?Other Instructions ? ?[facility] ?[facility] ?[address] ?[place] [address] ?Dept: [phone] ?Loc: [phone] ? ?Ruben Fuentes  04/15/2022 ? ?You are scheduled for a Peripheral Angiogram on Monday, May 22 with Dr. Nanetta Batty. ? ?1. Please arrive at the Main Entrance A at [facility]: [address] [place], [place] [address] at 7:30 AM (This time is two hours before your procedure to ensure your preparation). Free valet parking service is available.  ? ?Special note: Every effort is made to have your procedure done on time. Please understand that emergencies sometimes delay scheduled procedures. ? ?2. Diet: Do not eat solid foods after midnight.  You may have clear liquids until 5 AM upon the day of  the procedure. ? ?3. Labs: You will need to have blood drawn today ? ?4. Medication instructions in preparation for your procedure: ? ?  ?On the morning of your procedure, take Aspirin and any morning medicines NOT listed above.  You may use sips of water. ? ?5. Plan to go home the same day, you will only stay overnight if medically necessary. ?6. You MUST have a responsible adult to  drive you home. ?7. An adult MUST be with you the first 24 hours after you arrive home. ?8. Bring a current list of your medications, and the last time and date medication taken. ?9. Bring ID and current insurance cards. ?10.Please wear clothes that are easy to get on and off and wear slip-on shoes. ? ?Thank you for allowing [name] to care for you! ?  -- [facility] ? ?

## 2022-04-15 NOTE — Assessment & Plan Note (Signed)
Ruben Fuentes was referred to me by Dr. Izora Ribas for PAD.  He has had right lower extremity claudication for several months.  He does have multiple cardiovascular risk factors.  Dopplers performed in our office 04/02/2022 revealed a right ABI of 0.50 and a left of 0.97.  He has a high-frequency signal in his right external leg artery and a short segment CTO in his proximal right SFA.  He wishes to proceed with outpatient peripheral vascular angiography and endovascular therapy for lifestyle-limiting claudication. ?

## 2022-04-15 NOTE — Progress Notes (Signed)
   04/15/2022 Ruben Fuentes   08/29/1961  [id number]  Primary Physician Fleming, Zelda W, NP Primary Cardiologist: Ruben Fetch J Della Scrivener MD FACP, FACC, FAHA, FSCAI  HPI:  Ruben Fuentes is a 61 y.o. thin-appearing married African-American male father of 2 children with no grandchildren who is retired from doing sheet rock work.  He was referred by Dr. Chandrasehkar for PAD.  He does have a history of continued tobacco abuse, treated hypertension and hyperlipidemia.  There is no family history of heart disease.  Never had heart attack or stroke.  He denies chest pain or shortness of breath.  He has had claudication for the last several months primarily involving his right leg with recent Doppler studies performed/26/23 revealing a right ABI of 0.50 and a left of 0.97.  He does have a high-frequency signal in his right extrailiac artery and a short segment CTO proximal right SFA.  He wishes to proceed with angiography and potential endovascular therapy.   Current Meds  Medication Sig   albuterol (VENTOLIN HFA) 108 (90 Base) MCG/ACT inhaler Inhale 2 puffs into the lungs every 6 (six) hours as needed for wheezing or shortness of breath.   amLODipine (NORVASC) 10 MG tablet TAKE 1 TABLET (10 MG TOTAL) BY MOUTH DAILY.   atorvastatin (LIPITOR) 20 MG tablet TAKE 1 TABLET (20 MG TOTAL) BY MOUTH DAILY.   chlorthalidone (HYGROTON) 25 MG tablet Take 0.5 tablets (12.5 mg total) by mouth daily.   diclofenac Sodium (VOLTAREN) 1 % GEL APPLY 4 G TOPICALLY 4 (FOUR) TIMES DAILY.   fluticasone (FLOVENT HFA) 110 MCG/ACT inhaler INHALE 2 PUFFS INTO THE LUNGS DAILY.   gabapentin (NEURONTIN) 300 MG capsule TAKE 1 CAPSULE (300 MG TOTAL) BY MOUTH 3 (THREE) TIMES DAILY.   losartan (COZAAR) 100 MG tablet TAKE 1 TABLET (100 MG TOTAL) BY MOUTH DAILY.   pantoprazole (PROTONIX) 40 MG tablet TAKE 1 TABLET (40 MG TOTAL) BY MOUTH DAILY.     No Known Allergies  Social History   Socioeconomic History   Marital status: Married     Spouse name: Ruben Fuentes   Number of children: 2   Years of education: Not on file   Highest education level: Not on file  Occupational History   Not on file  Tobacco Use   Smoking status: Every Day    Packs/day: 0.10    Years: 30.00    Pack years: 3.00    Types: Cigarettes   Smokeless tobacco: Never  Vaping Use   Vaping Use: Never used  Substance and Sexual Activity   Alcohol use: Yes    Alcohol/week: 2.0 standard drinks    Types: 2 Cans of beer per week    Comment: up to 5 beers a day   Drug use: Yes    Types: Marijuana    Comment: 2 cigs of it a day   Sexual activity: Not Currently  Other Topics Concern   Not on file  Social History Narrative   Not on file   Social Determinants of Health   Financial Resource Strain: Not on file  Food Insecurity: Not on file  Transportation Needs: Not on file  Physical Activity: Not on file  Stress: Not on file  Social Connections: Not on file  Intimate Partner Violence: Not on file     Review of Systems: General: negative for chills, fever, night sweats or weight changes.  Cardiovascular: negative for chest pain, dyspnea on exertion, edema, orthopnea, palpitations, paroxysmal nocturnal dyspnea or shortness   of breath Dermatological: negative for rash Respiratory: negative for cough or wheezing Urologic: negative for hematuria Abdominal: negative for nausea, vomiting, diarrhea, bright red blood per rectum, melena, or hematemesis Neurologic: negative for visual changes, syncope, or dizziness All other systems reviewed and are otherwise negative except as noted above.    Blood pressure (!) 122/54, pulse 64, height 5' 7" (1.702 m), weight 130 lb (59 kg).  General appearance: alert and no distress Neck: no adenopathy, no JVD, supple, symmetrical, trachea midline, thyroid not enlarged, symmetric, no tenderness/mass/nodules, and right carotid bruit Lungs: clear to auscultation bilaterally Heart: regular rate and rhythm,  S1, S2 normal, no murmur, click, rub or gallop Extremities: extremities normal, atraumatic, no cyanosis or edema Pulses: Absent right pedal pulse Skin: Skin color, texture, turgor normal. No rashes or lesions Neurologic: Grossly normal  EKG not performed today  ASSESSMENT AND PLAN:   Right leg claudication ([facility]) Ruben Fuentes was referred to me by Dr. Chandrasekhar for PAD.  He has had right lower extremity claudication for several months.  He does have multiple cardiovascular risk factors.  Dopplers performed in our office 04/02/2022 revealed a right ABI of 0.50 and a left of 0.97.  He has a high-frequency signal in his right external leg artery and a short segment CTO in his proximal right SFA.  He wishes to proceed with outpatient peripheral vascular angiography and endovascular therapy for lifestyle-limiting claudication.     Ruben Fuentes J. Elan Brainerd MD FACP,FACC,FAHA, FSCAI 04/15/2022 10:16 AM 

## 2022-04-15 NOTE — H&P (View-Only) (Signed)
04/15/2022 Ruben PUNZALAN   26-Dec-1960  [id number]  Primary Physician Ruben Pounds, NP Primary Cardiologist: Lorretta Harp MD Ruben Fuentes, [place]  HPI:  Ruben Fuentes is a 61 y.o. thin-appearing married African-American male father of 2 children with no grandchildren who is retired from doing sheet rock work.  He was referred by Dr. Gwendolyn Fuentes for PAD.  He does have a history of continued tobacco abuse, treated hypertension and hyperlipidemia.  There is no family history of heart disease.  Never had heart attack or stroke.  He denies chest pain or shortness of breath.  He has had claudication for the last several months primarily involving his right leg with recent Doppler studies performed/26/23 revealing a right ABI of 0.50 and a left of 0.97.  He does have a high-frequency signal in his right extrailiac artery and a short segment CTO proximal right SFA.  He wishes to proceed with angiography and potential endovascular therapy.   Current Meds  Medication Sig   albuterol (VENTOLIN HFA) 108 (90 Base) MCG/ACT inhaler Inhale 2 puffs into the lungs every 6 (six) hours as needed for wheezing or shortness of breath.   amLODipine (NORVASC) 10 MG tablet TAKE 1 TABLET (10 MG TOTAL) BY MOUTH DAILY.   atorvastatin (LIPITOR) 20 MG tablet TAKE 1 TABLET (20 MG TOTAL) BY MOUTH DAILY.   chlorthalidone (HYGROTON) 25 MG tablet Take 0.5 tablets (12.5 mg total) by mouth daily.   diclofenac Sodium (VOLTAREN) 1 % GEL APPLY 4 G TOPICALLY 4 (FOUR) TIMES DAILY.   fluticasone (FLOVENT HFA) 110 MCG/ACT inhaler INHALE 2 PUFFS INTO THE LUNGS DAILY.   gabapentin (NEURONTIN) 300 MG capsule TAKE 1 CAPSULE (300 MG TOTAL) BY MOUTH 3 (THREE) TIMES DAILY.   losartan (COZAAR) 100 MG tablet TAKE 1 TABLET (100 MG TOTAL) BY MOUTH DAILY.   pantoprazole (PROTONIX) 40 MG tablet TAKE 1 TABLET (40 MG TOTAL) BY MOUTH DAILY.     No Known Allergies  Social History   Socioeconomic History   Marital status: Married     Spouse name: Chuck Alamillo   Number of children: 2   Years of education: Not on file   Highest education level: Not on file  Occupational History   Not on file  Tobacco Use   Smoking status: Every Day    Packs/day: 0.10    Years: 30.00    Pack years: 3.00    Types: Cigarettes   Smokeless tobacco: Never  Vaping Use   Vaping Use: Never used  Substance and Sexual Activity   Alcohol use: Yes    Alcohol/week: 2.0 standard drinks    Types: 2 Cans of beer per week    Comment: up to 5 beers a day   Drug use: Yes    Types: Marijuana    Comment: 2 cigs of it a day   Sexual activity: Not Currently  Other Topics Concern   Not on file  Social History Narrative   Not on file   Social Determinants of Health   Financial Resource Strain: Not on file  Food Insecurity: Not on file  Transportation Needs: Not on file  Physical Activity: Not on file  Stress: Not on file  Social Connections: Not on file  Intimate Partner Violence: Not on file     Review of Systems: General: negative for chills, fever, night sweats or weight changes.  Cardiovascular: negative for chest pain, dyspnea on exertion, edema, orthopnea, palpitations, paroxysmal nocturnal dyspnea or shortness  of breath Dermatological: negative for rash Respiratory: negative for cough or wheezing Urologic: negative for hematuria Abdominal: negative for nausea, vomiting, diarrhea, bright red blood per rectum, melena, or hematemesis Neurologic: negative for visual changes, syncope, or dizziness All other systems reviewed and are otherwise negative except as noted above.    Blood pressure (!) 122/54, pulse 64, height 5\' 7"  (1.702 m), weight 130 lb (59 kg).  General appearance: alert and no distress Neck: no adenopathy, no JVD, supple, symmetrical, trachea midline, thyroid not enlarged, symmetric, no tenderness/mass/nodules, and right carotid bruit Lungs: clear to auscultation bilaterally Heart: regular rate and rhythm,  S1, S2 normal, no murmur, click, rub or gallop Extremities: extremities normal, atraumatic, no cyanosis or edema Pulses: Absent right pedal pulse Skin: Skin color, texture, turgor normal. No rashes or lesions Neurologic: Grossly normal  EKG not performed today  ASSESSMENT AND PLAN:   Right leg claudication [facility]) Mr. Greulich was referred to me by Dr. Gasper Fuentes for PAD.  He has had right lower extremity claudication for several months.  He does have multiple cardiovascular risk factors.  Dopplers performed in our office 04/02/2022 revealed a right ABI of 0.50 and a left of 0.97.  He has a high-frequency signal in his right external leg artery and a short segment CTO in his proximal right SFA.  He wishes to proceed with outpatient peripheral vascular angiography and endovascular therapy for lifestyle-limiting claudication.     Lorretta Harp MD FACP,FACC,FAHA, [facility] 04/15/2022 10:16 AM

## 2022-04-18 ENCOUNTER — Other Ambulatory Visit: Payer: Self-pay

## 2022-04-18 DIAGNOSIS — I739 Peripheral vascular disease, unspecified: Secondary | ICD-10-CM

## 2022-04-18 MED ORDER — SODIUM CHLORIDE 0.9% FLUSH: 3.0000 mL | Freq: Two times a day (BID) | INTRAVENOUS | Status: DC

## 2022-04-24 ENCOUNTER — Telehealth: Payer: Self-pay | Admitting: *Deleted

## 2022-04-24 NOTE — Telephone Encounter (Signed)
Abdominal aortogram scheduled at [facility] for: Monday Apr 28, 2022 9:30 AM Arrival time and place: [facility] Main Entrance A at: 7:30 AM   Nothing to eat after midnight prior to procedure, clear liquids until 5 AM day of procedure.   Medication instructions: -Hold:  Chlorthalidone-AM of procedure -Except hold medications usual morning medications can be taken with sips of water including aspirin 81 mg.  Confirmed patient has responsible adult to drive home post procedure and be with patient first 24 hours after arriving home.  Patient reports no new symptoms concerning for COVID-19/no exposure to COVID-19 in the past 10 days.  Reviewed procedure instructions with patient's wife (DPR).

## 2022-04-25 ENCOUNTER — Other Ambulatory Visit: Payer: Self-pay

## 2022-04-28 ENCOUNTER — Encounter (HOSPITAL_COMMUNITY)
Admission: RE | Disposition: A | Discharge status: Home / Self Care | Payer: Self-pay | Source: Home / Self Care | Attending: Cardiovascular Disease

## 2022-04-28 ENCOUNTER — Ambulatory Visit (HOSPITAL_COMMUNITY)
Admission: RE | Admit: 2022-04-28 | Discharge: 2022-04-29 | Disposition: A | Discharge status: Home / Self Care | Payer: Self-pay | Attending: Cardiovascular Disease | Admitting: Cardiovascular Disease

## 2022-04-28 ENCOUNTER — Other Ambulatory Visit: Payer: Self-pay

## 2022-04-28 DIAGNOSIS — I70211 Atherosclerosis of native arteries of extremities with intermittent claudication, right leg: Secondary | ICD-10-CM

## 2022-04-28 DIAGNOSIS — F17210 Nicotine dependence, cigarettes, uncomplicated: Secondary | ICD-10-CM | POA: Insufficient documentation

## 2022-04-28 DIAGNOSIS — E782 Mixed hyperlipidemia: Secondary | ICD-10-CM | POA: Diagnosis present

## 2022-04-28 DIAGNOSIS — Z79899 Other long term (current) drug therapy: Secondary | ICD-10-CM | POA: Insufficient documentation

## 2022-04-28 DIAGNOSIS — N1831 Chronic kidney disease, stage 3a: Secondary | ICD-10-CM | POA: Insufficient documentation

## 2022-04-28 DIAGNOSIS — I129 Hypertensive chronic kidney disease with stage 1 through stage 4 chronic kidney disease, or unspecified chronic kidney disease: Secondary | ICD-10-CM | POA: Insufficient documentation

## 2022-04-28 DIAGNOSIS — I10 Essential (primary) hypertension: Secondary | ICD-10-CM | POA: Diagnosis present

## 2022-04-28 DIAGNOSIS — Z720 Tobacco use: Secondary | ICD-10-CM | POA: Diagnosis present

## 2022-04-28 DIAGNOSIS — I739 Peripheral vascular disease, unspecified: Secondary | ICD-10-CM | POA: Diagnosis present

## 2022-04-28 HISTORY — PX: PERIPHERAL VASCULAR INTERVENTION: CATH118257

## 2022-04-28 HISTORY — PX: ABDOMINAL AORTOGRAM W/LOWER EXTREMITY: CATH118223

## 2022-04-28 LAB — POCT ACTIVATED CLOTTING TIME
Activated Clotting Time: 293 seconds
Activated Clotting Time: 317 seconds
Activated Clotting Time: 251 seconds

## 2022-04-28 SURGERY — ABDOMINAL AORTOGRAM W/LOWER EXTREMITY
Anesthesia: LOCAL

## 2022-04-28 MED ORDER — SODIUM CHLORIDE 0.9 % IV SOLN: 250.0000 mL | INTRAVENOUS | Status: DC | PRN

## 2022-04-28 MED ORDER — HEPARIN SODIUM (PORCINE) 1000 UNIT/ML IJ SOLN
INTRAMUSCULAR | Status: AC
  Filled 2022-04-28: qty 10

## 2022-04-28 MED ORDER — LIDOCAINE HCL (PF) 1 % IJ SOLN
INTRAMUSCULAR | Status: DC | PRN
  Administered 2022-04-28: 15 mL

## 2022-04-28 MED ORDER — LABETALOL HCL 5 MG/ML IV SOLN: 10.0000 mg | INTRAVENOUS | Status: DC | PRN

## 2022-04-28 MED ORDER — MIDAZOLAM HCL 2 MG/2ML IJ SOLN
INTRAMUSCULAR | Status: AC
  Filled 2022-04-28: qty 2

## 2022-04-28 MED ORDER — MIDAZOLAM HCL 2 MG/2ML IJ SOLN
INTRAMUSCULAR | Status: DC | PRN
  Administered 2022-04-28: 1 mg via INTRAVENOUS

## 2022-04-28 MED ORDER — HEPARIN SODIUM (PORCINE) 1000 UNIT/ML IJ SOLN
INTRAMUSCULAR | Status: DC | PRN
Start: 2022-04-28 — End: 2022-04-28
  Administered 2022-04-28: 7000 [IU] via INTRAVENOUS

## 2022-04-28 MED ORDER — CLOPIDOGREL BISULFATE 300 MG PO TABS
ORAL_TABLET | ORAL | Status: DC | PRN
Start: 2022-04-28 — End: 2022-04-28
  Administered 2022-04-28: 300 mg via ORAL

## 2022-04-28 MED ORDER — FENTANYL CITRATE (PF) 100 MCG/2ML IJ SOLN
INTRAMUSCULAR | Status: AC
Start: 2022-04-28 — End: ?
  Filled 2022-04-28: qty 2

## 2022-04-28 MED ORDER — CLOPIDOGREL BISULFATE 75 MG PO TABS
75.0000 mg | ORAL_TABLET | Freq: Every day | ORAL | Status: DC
  Administered 2022-04-29: 75 mg via ORAL
  Filled 2022-04-28: qty 1

## 2022-04-28 MED ORDER — FAMOTIDINE IN NACL 20-0.9 MG/50ML-% IV SOLN
INTRAVENOUS | Status: DC | PRN
  Administered 2022-04-28: 20 mg via INTRAVENOUS

## 2022-04-28 MED ORDER — ATORVASTATIN CALCIUM 10 MG PO TABS
20.0000 mg | ORAL_TABLET | Freq: Every day | ORAL | Status: DC
  Administered 2022-04-29: 20 mg via ORAL
  Filled 2022-04-28 (×2): qty 2

## 2022-04-28 MED ORDER — HYDRALAZINE HCL 20 MG/ML IJ SOLN: 5.0000 mg | INTRAMUSCULAR | Status: DC | PRN

## 2022-04-28 MED ORDER — ALBUTEROL SULFATE (2.5 MG/3ML) 0.083% IN NEBU: 2.5000 mg | INHALATION_SOLUTION | Freq: Four times a day (QID) | RESPIRATORY_TRACT | Status: DC | PRN

## 2022-04-28 MED ORDER — SODIUM CHLORIDE 0.9 % IV SOLN: INTRAVENOUS | Status: AC

## 2022-04-28 MED ORDER — FENTANYL CITRATE (PF) 100 MCG/2ML IJ SOLN
INTRAMUSCULAR | Status: DC | PRN
Start: 2022-04-28 — End: 2022-04-28
  Administered 2022-04-28: 25 ug via INTRAVENOUS

## 2022-04-28 MED ORDER — LIDOCAINE HCL (PF) 1 % IJ SOLN
INTRAMUSCULAR | Status: AC
Start: 2022-04-28 — End: ?
  Filled 2022-04-28: qty 30

## 2022-04-28 MED ORDER — ALBUTEROL SULFATE HFA 108 (90 BASE) MCG/ACT IN AERS: 2.0000 | INHALATION_SPRAY | Freq: Four times a day (QID) | RESPIRATORY_TRACT | Status: DC | PRN

## 2022-04-28 MED ORDER — SODIUM CHLORIDE 0.9% FLUSH: 3.0000 mL | INTRAVENOUS | Status: DC | PRN

## 2022-04-28 MED ORDER — IODIXANOL 320 MG/ML IV SOLN
INTRAVENOUS | Status: DC | PRN
  Administered 2022-04-28: 285 mL

## 2022-04-28 MED ORDER — SODIUM CHLORIDE 0.9% FLUSH
3.0000 mL | Freq: Two times a day (BID) | INTRAVENOUS | Status: DC
  Administered 2022-04-29: 3 mL via INTRAVENOUS

## 2022-04-28 MED ORDER — ASPIRIN 81 MG PO CHEW: 81.0000 mg | CHEWABLE_TABLET | ORAL | Status: DC

## 2022-04-28 MED ORDER — SODIUM CHLORIDE 0.9 % WEIGHT BASED INFUSION: 1.0000 mL/kg/h | INTRAVENOUS | Status: DC

## 2022-04-28 MED ORDER — ACETAMINOPHEN 325 MG PO TABS: 650.0000 mg | ORAL_TABLET | ORAL | Status: DC | PRN

## 2022-04-28 MED ORDER — ONDANSETRON HCL 4 MG/2ML IJ SOLN: 4.0000 mg | Freq: Four times a day (QID) | INTRAMUSCULAR | Status: DC | PRN

## 2022-04-28 MED ORDER — ASPIRIN 81 MG PO TBEC
81.0000 mg | DELAYED_RELEASE_TABLET | Freq: Every day | ORAL | Status: DC
  Administered 2022-04-29: 81 mg via ORAL
  Filled 2022-04-28: qty 1

## 2022-04-28 MED ORDER — FAMOTIDINE IN NACL 20-0.9 MG/50ML-% IV SOLN
INTRAVENOUS | Status: AC
  Filled 2022-04-28: qty 50

## 2022-04-28 MED ORDER — AMLODIPINE BESYLATE 10 MG PO TABS
10.0000 mg | ORAL_TABLET | Freq: Every day | ORAL | Status: DC
  Administered 2022-04-29: 10 mg via ORAL
  Filled 2022-04-28 (×2): qty 1

## 2022-04-28 MED ORDER — HEPARIN (PORCINE) IN NACL 1000-0.9 UT/500ML-% IV SOLN
INTRAVENOUS | Status: DC | PRN
  Administered 2022-04-28 (×2): 500 mL

## 2022-04-28 MED ORDER — SODIUM CHLORIDE 0.9 % WEIGHT BASED INFUSION
3.0000 mL/kg/h | INTRAVENOUS | Status: DC
  Administered 2022-04-28: 3 mL/kg/h via INTRAVENOUS

## 2022-04-28 MED ORDER — LOSARTAN POTASSIUM 50 MG PO TABS
100.0000 mg | ORAL_TABLET | Freq: Every day | ORAL | Status: DC
  Administered 2022-04-29: 100 mg via ORAL
  Filled 2022-04-28 (×2): qty 2

## 2022-04-28 MED ORDER — MORPHINE SULFATE (PF) 2 MG/ML IV SOLN: 2.0000 mg | INTRAVENOUS | Status: DC | PRN

## 2022-04-28 MED ORDER — CHLORTHALIDONE 25 MG PO TABS
12.5000 mg | ORAL_TABLET | Freq: Every day | ORAL | Status: DC
  Administered 2022-04-29: 12.5 mg via ORAL
  Filled 2022-04-28 (×2): qty 0.5

## 2022-04-28 MED ORDER — HEPARIN (PORCINE) IN NACL 1000-0.9 UT/500ML-% IV SOLN
INTRAVENOUS | Status: AC
  Filled 2022-04-28: qty 1000

## 2022-04-28 SURGICAL SUPPLY — 27 items
BALLN COYOTE ES OTW 4X40X145 (BALLOONS) ×3
BALLN MUSTANG 5.0X20 75 (BALLOONS) ×3
BALLN MUSTANG 5.0X40 135 (BALLOONS) ×3
BALLOON COYOTE ES OTW 4X40X145 (BALLOONS) IMPLANT
BALLOON MUSTANG 5.0X20 75 (BALLOONS) IMPLANT
BALLOON MUSTANG 5.0X40 135 (BALLOONS) IMPLANT
CATH ANGIO 5F PIGTAIL 65CM (CATHETERS) ×1 IMPLANT
CATH CROSS OVER TEMPO 5F (CATHETERS) ×1 IMPLANT
CATH STRAIGHT 5FR 65CM (CATHETERS) ×1 IMPLANT
CATH VIANCE CROSS STAND 150CM (MICROCATHETER) ×3
CATH VIANCE CROSS STD 150CM (MICROCATHETER) IMPLANT
KIT ENCORE 26 ADVANTAGE (KITS) ×1 IMPLANT
KIT MICROPUNCTURE NIT STIFF (SHEATH) ×1 IMPLANT
KIT PV (KITS) ×3 IMPLANT
SHEATH FLEXOR ANSEL 1 7F 45CM (SHEATH) ×1 IMPLANT
SHEATH PINNACLE 5F 10CM (SHEATH) ×1 IMPLANT
SHEATH PINNACLE 7F 10CM (SHEATH) ×1 IMPLANT
SHEATH PROBE COVER 6X72 (BAG) ×1 IMPLANT
STENT ELUVIA 6X60X130 (Permanent Stent) ×1 IMPLANT
STENT VIABAHN 7X29X80 VBX (Permanent Stent) ×1 IMPLANT
SYR MEDRAD MARK 7 150ML (SYRINGE) ×3 IMPLANT
TAPE SHOOT N SEE (TAPE) ×1 IMPLANT
TRANSDUCER W/STOPCOCK (MISCELLANEOUS) ×3 IMPLANT
TRAY PV CATH (CUSTOM PROCEDURE TRAY) ×3 IMPLANT
WIRE HITORQ VERSACORE ST 145CM (WIRE) ×1 IMPLANT
WIRE SHEPHERD 6G .014 (WIRE) ×1 IMPLANT
WIRE VERSACORE LOC 115CM (WIRE) ×1 IMPLANT

## 2022-04-28 NOTE — Interval H&P Note (Signed)
History and Physical Interval Note:  04/28/2022 10:18 AM  Ruben Fuentes  has presented today for surgery, with the diagnosis of pad.  The various methods of treatment have been discussed with the patient and family. After consideration of risks, benefits and other options for treatment, the patient has consented to  Procedure(s): ABDOMINAL AORTOGRAM W/LOWER EXTREMITY (N/A) as a surgical intervention.  The patient's history has been reviewed, patient examined, no change in status, stable for surgery.  I have reviewed the patient's chart and labs.  Questions were answered to the patient's satisfaction.     Quay Burow

## 2022-04-28 NOTE — Progress Notes (Signed)
Arterial Sheath Removal  Sheath removed and manual pressure held for 30 min.  VVS throughout, patient educated. Dressing clean, dry, intact. DP Doppler.  Bed Rest at 1550  [facility] RN

## 2022-04-29 ENCOUNTER — Encounter (HOSPITAL_COMMUNITY): Payer: Self-pay | Admitting: Cardiovascular Disease

## 2022-04-29 ENCOUNTER — Other Ambulatory Visit (HOSPITAL_COMMUNITY): Payer: Self-pay

## 2022-04-29 ENCOUNTER — Other Ambulatory Visit: Payer: Self-pay

## 2022-04-29 DIAGNOSIS — I739 Peripheral vascular disease, unspecified: Secondary | ICD-10-CM

## 2022-04-29 LAB — BASIC METABOLIC PANEL
Anion gap: 8 (ref 5–15)
BUN: 8 mg/dL (ref 6–20)
CO2: 23 mmol/L (ref 22–32)
Calcium: 8.9 mg/dL (ref 8.9–10.3)
Chloride: 103 mmol/L (ref 98–111)
Creatinine, Ser: 1.06 mg/dL (ref 0.61–1.24)
GFR, Estimated: >60 mL/min (ref >60)
Glucose, Bld: 122 mg/dL — ABNORMAL HIGH (ref 70–99)
Potassium: 3.6 mmol/L (ref 3.5–5.1)
Sodium: 134 mmol/L — ABNORMAL LOW (ref 135–145)

## 2022-04-29 LAB — POCT ACTIVATED CLOTTING TIME
Activated Clotting Time: 209 seconds
Activated Clotting Time: 191 seconds
Activated Clotting Time: 167 seconds

## 2022-04-29 LAB — LIPID PANEL
Cholesterol: 157 mg/dL (ref 0–200)
HDL: 48 mg/dL (ref >40)
LDL Cholesterol: 92 mg/dL (ref 0–99)
Total CHOL/HDL Ratio: 3.3 RATIO
Triglycerides: 86 mg/dL (ref <150)
VLDL: 17 mg/dL (ref 0–40)

## 2022-04-29 LAB — CBC
HCT: 32.8 % — ABNORMAL LOW (ref 39.0–52.0)
Hemoglobin: 11.4 g/dL — ABNORMAL LOW (ref 13.0–17.0)
MCH: 28.9 pg (ref 26.0–34.0)
MCHC: 34.8 g/dL (ref 30.0–36.0)
MCV: 83.2 fL (ref 80.0–100.0)
Platelets: 245 10*3/uL (ref 150–400)
RBC: 3.94 MIL/uL — ABNORMAL LOW (ref 4.22–5.81)
RDW: 15 % (ref 11.5–15.5)
WBC: 7.1 10*3/uL (ref 4.0–10.5)
nRBC: 0 % (ref 0.0–0.2)

## 2022-04-29 MED ORDER — CLOPIDOGREL BISULFATE 75 MG PO TABS
75.0000 mg | ORAL_TABLET | Freq: Every day | ORAL | 3 refills | Status: DC
Start: 2022-04-30 — End: 2022-06-03
  Filled 2022-04-29: qty 90, 90d supply, fill #0

## 2022-04-29 MED ORDER — ASPIRIN 81 MG PO TBEC
81.0000 mg | DELAYED_RELEASE_TABLET | Freq: Every day | ORAL | 3 refills | Status: DC
Start: 2022-04-30 — End: 2022-06-03
  Filled 2022-04-29: qty 90, 90d supply, fill #0

## 2022-04-29 NOTE — Discharge Summary (Cosign Needed)
Discharge Summary    Patient ID: Ruben Fuentes MRN: [id number]; DOB: 1961-06-11  Admit date: 04/28/2022 Discharge date: 04/29/2022  PCP:  Gildardo Pounds, NP   [facility] Providers Cardiologist:  Werner Lean, MD      Discharge Diagnoses    Principal Problem:   Claudication in peripheral vascular disease [facility]) Active Problems:   Essential hypertension   Mixed hyperlipidemia   Right leg claudication ([facility])   Tobacco abuse    Diagnostic Studies/Procedures    Abdominal Aortogram with Lower Extremity Peripheral Vascular Intervention 04/28/22  Procedures Performed:             1.  Ultrasound-guided left common femoral access             2.  Abdominal aortogram/bilateral iliac angiogram/bifemoral runoff             3.  Contralateral axis (secondary catheter placement)             4.  VBX covered stenting right extrailiac artery             5, Eluvia stenting ostium/proximal right SFA CTO   Angiographic Data:  1: Abdominal aorta-renal arteries widely patent, infrarenal abdominal arteries free of significant atherosclerotic changes 2: Left lower extremity-widely patent with three-vessel runoff 3: Right lower extremity-80% calcified eccentric proximal right external leg artery stenosis, occluded right SFA at its origin reconstituting several centimeters beyond this with three-vessel runoff   IMPRESSION: Ruben Fuentes  has a high-grade calcified eccentric proximal right extrailiac artery stenosis as well as a ostial/proximal right SFA CTO.  We will proceed with VBX stenting of his external iliac and most likely Eluvia stenting of his SFA.  Final Impression: Successful right externally carotid artery VBX covered stenting and ostial/proximal right SFA CTO PTA and stenting using Eluvia drug eluting stent.  The sheath will be removed once ACT falls below 170 pressure held.  Patient will be hydrated overnight, discharged home in the morning on dual antiplatelet therapy.  We  will get lower extremity arterial Doppler studies, [facility] office next week and I will see him back the week after.  He left the lab in stable condition. _____________   History of Present Illness     Ruben Fuentes is a 61 y.o.  thin-appearing married African-American male father of 2 children with no grandchildren who is retired from doing sheet rock work.  He was referred to Dr. Gwenlyn Found by Dr. Gwendolyn Fill for evaluation/treatment of PAD. Patient does have a history of continued tobacco abuse, treated hypertension and hyperlipidemia.  There is no family history of heart disease.  Never had heart attack or stroke.  He denies chest pain or shortness of breath.  He has had claudication for the last several months primarily involving his right leg with recent Doppler studies performed/26/23 revealing a right ABI of 0.50 and a left of 0.97.  He does have a high-frequency signal in his right extrailiac artery and a short segment CTO proximal right SFA.  He wishes to proceed with angiography and potential endovascular therapy.  Hospital Course     Consultants: None    PAD  Right leg claudication  Mixed HLD - Patient underwent successful right extrailiac artery VBX covered stenting and ostial/proximal right SFT CTO PTA and stenting on 04/28/22 - Patient to be discharged on DAPT with ASA, plavix  - Continue statin  - Patient is scheduled to have lower extremity arterial doppler studies next week and has a follow up appointment  on 6/7 with Dr. Gwenlyn Found   Essential HTN with CKD stage IIIa  - Continue amlodipine 10 mg, clorthalidone 12.5 mg daily, losartan 100 mg daily  - Continue to monitor and make medication adjustments in the outpatient setting  Tobacco Use  - Patient reports that he has cut back on his smoking, encouraged total cessation and explained the negative affects of tobacco on the cardiovascular system. Patient voiced understanding and agreed to try to quit smoking - Declined nicotine  patches/gum   Physical Exam Constitutional:      General: He is not in acute distress.    Appearance: Normal appearance.  HENT:     Head: Normocephalic and atraumatic.     Nose: Nose normal.  Eyes:     Extraocular Movements: Extraocular movements intact.     Conjunctiva/sclera: Conjunctivae normal.  Cardiovascular:     Rate and Rhythm: Normal rate and regular rhythm.     Heart sounds: No murmur heard.   No friction rub. No gallop.     Comments: Right femoral procedure site is stable with good hemostasis. No tenderness, warmth, or swelling noted. Site is soft. Pulmonary:     Effort: Pulmonary effort is normal.     Breath sounds: Normal breath sounds. No wheezing, rhonchi or rales.  Abdominal:     General: Abdomen is flat.     Palpations: Abdomen is soft.  Musculoskeletal:        General: No swelling or tenderness.     Right lower leg: No edema.     Left lower leg: No edema.  Skin:    General: Skin is warm and dry.  Neurological:     General: No focal deficit present.     Mental Status: He is alert and oriented to person, place, and time.  Psychiatric:        Mood and Affect: Mood normal.        Behavior: Behavior normal.     Did the patient have an acute coronary syndrome (MI, NSTEMI, STEMI, etc) this admission?:  No                               Did the patient have a percutaneous coronary intervention (stent / angioplasty)?:  No.       Patient was seen and examined by Dr. Marlou Porch and deemed stable for discharge.   Patient has a follow up appointment with Dr. Gwenlyn Found on 6/7   _____________  Discharge Vitals Blood pressure (!) 158/80, pulse 63, temperature 97.7 F (36.5 C), temperature source Oral, resp. rate 15, height 5\' 7"  (1.702 m), weight 54.7 kg, SpO2 98 %.  Filed Weights   04/28/22 0741 04/28/22 2111  Weight: 61.2 kg 54.7 kg    Labs & Radiologic Studies    CBC Recent Labs    04/29/22 0319  WBC 7.1  HGB 11.4*  HCT 32.8*  MCV 83.2  PLT 99991111   Basic  Metabolic Panel Recent Labs    04/29/22 0319  NA 134*  K 3.6  CL 103  CO2 23  GLUCOSE 122*  BUN 8  CREATININE 1.06  CALCIUM 8.9   Liver Function Tests No results for input(s): AST, ALT, ALKPHOS, BILITOT, PROT, ALBUMIN in the last 72 hours. No results for input(s): LIPASE, AMYLASE in the last 72 hours. High Sensitivity Troponin:   No results for input(s): TROPONINIHS in the last 720 hours.  BNP Invalid input(s): POCBNP D-Dimer No results for  input(s): DDIMER in the last 72 hours. Hemoglobin A1C No results for input(s): HGBA1C in the last 72 hours. Fasting Lipid Panel Recent Labs    04/29/22 0319  CHOL 157  HDL 48  LDLCALC 92  TRIG 86  CHOLHDL 3.3   Thyroid Function Tests No results for input(s): TSH, T4TOTAL, T3FREE, THYROIDAB in the last 72 hours.  Invalid input(s): FREET3 _____________  PERIPHERAL VASCULAR CATHETERIZATION  Result Date: 04/28/2022 Images from the original result were not included.  [id number] LOCATION:  FACILITY: [facility] PHYSICIAN: Quay Burow, M.D. [birth year] DATE OF PROCEDURE:  04/28/2022 DATE OF DISCHARGE: PV Angiogram/Intervention History obtained from chart review.Ruben Fuentes is a 61 y.o. thin-appearing married African-American male father of 2 children with no grandchildren who is retired from doing sheet rock work.  He was referred by Dr. Gwendolyn Fill for PAD.  He does have a history of continued tobacco abuse, treated hypertension and hyperlipidemia.  There is no family history of heart disease.  Never had heart attack or stroke.  He denies chest pain or shortness of breath.  He has had claudication for the last several months primarily involving his right leg with recent Doppler studies performed/26/23 revealing a right ABI of 0.50 and a left of 0.97.  He does have a high-frequency signal in his right extrailiac artery and a short segment CTO proximal right SFA.  He wishes to proceed with angiography and potential endovascular therapy. Pre  Procedure Diagnosis: Peripheral arterial disease Post Procedure Diagnosis: Peripheral arterial disease Operators: Dr. Quay Burow Procedures Performed:  1.  Ultrasound-guided left common femoral access  2.  Abdominal aortogram/bilateral iliac angiogram/bifemoral runoff  3.  Contralateral axis (secondary catheter placement)  4.  VBX covered stenting right extrailiac artery             5, Eluvia stenting ostium/proximal right SFA CTO PROCEDURE DESCRIPTION: The patient was brought to the second floor [place] [facility] lab in the the postabsorptive state. He was premedicated with IV Versed and fentanyl. His left groin was prepped and shaved in usual sterile fashion. Xylocaine 1% was used for local anesthesia. A 5 French sheath was inserted into the left common femoral artery using standard Seldinger technique.  Ultrasound was used to identify the left common femoral artery and guide access.  A digital image was captured and placed in the patient's chart.  A 5 French pigtail catheter was placed in the distal abdominal aorta.  Distal abdominal aortography, bilateral iliac angiography with bifemoral runoff was performed using bolus chase, digital subtraction and stent table technique.  On the Gwenlyn Found was used for the entirety of the case (285 cc of contrast to the patient).  Retrograde pressures measured on the case.  Angiographic Data: 1: Abdominal aorta-renal arteries widely patent, infrarenal abdominal arteries free of significant atherosclerotic changes 2: Left lower extremity-widely patent with three-vessel runoff 3: Right lower extremity-80% calcified eccentric proximal right external leg artery stenosis, occluded right SFA at its origin reconstituting several centimeters beyond this with three-vessel runoff   Ruben Fuentes  has a high-grade calcified eccentric proximal right extrailiac artery stenosis as well as a ostial/proximal right SFA CTO.  We will proceed with VBX stenting of his external iliac and most  likely Eluvia stenting of his SFA. Procedure Description: Contralateral access was obtained with a crossover catheter, 035 versa core wire and 7 French 45 cm Ansell sheath.  Patient received a total of 7000's of heparin with an ACT of 293.  Retrograde aortic pressures monitored in the case.  I predilated the proximal right extrailiac artery with a 5 x 2 balloon and stented with a 7 mm x 29 mm long VBX covered stent in nominal pressures resulting in reduction of a 80% calcified eccentric proximal right external iliac artery stenosis with 0% residual. I then used a 014 6 g Shepard wire and a Viance CTO catheter to cross the ostial/proximal right SFA CTO successfully.  I predilated with a 4 mm x 4 cm balloon and placed a 6 mm x 4 cm Eluvia drug-eluting stent at the origin of the right SFA postdilated with a 5 mm x 4 cm balloon resulting in reduction of a short segment proximal right SFA CTO to 0% residual.  The sheath was then withdrawn across the bifurcation and exchanged over an [place] wire for a short 7 [place] sheath which was then secured in place.  The patient received 300 mg of p.o. clopidogrel and 20 mg of IV Pepcid. Final Impression: Successful right externally carotid artery VBX covered stenting and ostial/proximal right SFA CTO PTA and stenting using Eluvia drug eluting stent.  The sheath will be removed once ACT falls below 170 pressure held.  Patient will be hydrated overnight, discharged home in the morning on dual antiplatelet therapy.  We will get lower extremity arterial Doppler studies, [facility] office next week and I will see him back the week after.  He left the lab in stable condition. Quay Burow. MD, [facility] 04/28/2022 12:05 PM    VAS [name] LE ART SEG MULTI (Segm&LE Reynauds)  Result Date: 04/03/2022  LOWER EXTREMITY DOPPLER STUDY Patient Name:  Ruben Fuentes  Date of Exam:   04/02/2022 Medical Rec #: [id number]        Accession #:    [id number] Date of Birth: Sep 13, 1961        Patient Gender:  M Patient Age:   17 years Exam Location:  [facility] Procedure:      VAS [name] LOWER EXT ART SEG MULTI (SEGMENTALS & LE RAYNAUDS) Referring Phys: [facility] --------------------------------------------------------------------------------  Indications: Claudication. Patient presents with right leg pains and aches with              exertion that improves with rest. He also has hip and foot pain due              to arthritis and bursitis. There is some associated numbness in the              right foot and leg when walking. He reports his right leg has felt              this way for 'some while.' High Risk Factors: Hypertension, hyperlipidemia, current smoker.  Performing Technologist: Mariane Masters RVT  Examination Guidelines: A complete evaluation includes at minimum, Doppler waveform signals and systolic blood pressure reading at the level of bilateral brachial, anterior tibial, and posterior tibial arteries, when vessel segments are accessible. Bilateral testing is considered an integral part of a complete examination. Photoelectric Plethysmograph (PPG) waveforms and toe systolic pressure readings are included as required and additional duplex testing as needed. Limited examinations for reoccurring indications may be performed as noted.  ABI Findings: +---------+------------------+-----+-------------------+--------+ Right    Rt Pressure (mmHg)IndexWaveform           Comment  +---------+------------------+-----+-------------------+--------+ Brachial 143                                                +---------+------------------+-----+-------------------+--------+  CFA                             multiphasic                 +---------+------------------+-----+-------------------+--------+ Popliteal                       monophasic                  +---------+------------------+-----+-------------------+--------+ PTA      72                0.50 monophasic                   +---------+------------------+-----+-------------------+--------+ PERO     51                0.36 monophasic                  +---------+------------------+-----+-------------------+--------+ DP       64                0.45 dampened monophasic         +---------+------------------+-----+-------------------+--------+ Great Toe0                 0.00 Absent                      +---------+------------------+-----+-------------------+--------+ +---------+------------------+-----+----------+-------+ Left     Lt Pressure (mmHg)IndexWaveform  Comment +---------+------------------+-----+----------+-------+ Brachial 139                                      +---------+------------------+-----+----------+-------+ CFA                             triphasic         +---------+------------------+-----+----------+-------+ Popliteal                       triphasic         +---------+------------------+-----+----------+-------+ PTA      129               0.90 biphasic          +---------+------------------+-----+----------+-------+ PERO     112               0.78 monophasic        +---------+------------------+-----+----------+-------+ DP       139               0.97 biphasic          +---------+------------------+-----+----------+-------+ Great Toe102               0.71 Abnormal          +---------+------------------+-----+----------+-------+ +-------+-----------+-----------+------------+------------+ ABI/TBIToday's ABIToday's TBIPrevious ABIPrevious TBI +-------+-----------+-----------+------------+------------+ Right  0.50       0                                   +-------+-----------+-----------+------------+------------+ Left   0.97       0.71                                +-------+-----------+-----------+------------+------------+   Summary: Right: Resting right  ankle-brachial index indicates moderate right lower extremity arterial disease. The  right toe-brachial index is abnormal. Left: Resting left ankle-brachial index is within normal range. No evidence of significant left lower extremity arterial disease. The left toe-brachial index is normal. *See table(s) above for measurements and observations. See arterial duplex report.  Vascular consult recommended. Electronically signed by Larae Grooms MD on 04/03/2022 at 8:48:12 AM.    Final    VAS [name] LOWER EXTREMITY ARTERIAL DUPLEX  Result Date: 04/03/2022 LOWER EXTREMITY ARTERIAL DUPLEX STUDY Patient Name:  Ruben Fuentes [facility]  Date of Exam:   04/02/2022 Medical Rec #: [id number]        Accession #:    [id number] Date of Birth: 17-Sep-1961        Patient Gender: M Patient Age:   37 years Exam Location:  [facility] Procedure:      VAS [name] LOWER EXTREMITY ARTERIAL DUPLEX Referring Phys: [facility] --------------------------------------------------------------------------------  Indications: Claudication. Patient presents with right leg pains and aches with              exertion, that improves with rest. He also has hip and foot pain              due to arthritis and bursitis. There is some associated numbness in              the right foot and leg when walking. He reports his right leg has              felt this way for 'some while.' High Risk Factors: Hypertension, hyperlipidemia, current smoker.  Current ABI: Right-0.50              Left-0.97 Performing Technologist: Mariane Masters RVT  Examination Guidelines: A complete evaluation includes B-mode imaging, spectral Doppler, color Doppler, and power Doppler as needed of all accessible portions of each vessel. Bilateral testing is considered an integral part of a complete examination. Limited examinations for reoccurring indications may be performed as noted. Aorta: +--------+-------+----------+----------+---------+---------------------+-----+         AP (cm)Trans (cm)PSV (cm/s)Waveform Thrombus             Shape  +--------+-------+----------+----------+---------+---------------------+-----+ Proximal       2.30      89        triphasic                           +--------+-------+----------+----------+---------+---------------------+-----+ Mid     1.60   1.70      48        biphasic                            +--------+-------+----------+----------+---------+---------------------+-----+ Distal  1.80   1.60      43        biphasic calcific walls/plaque      +--------+-------+----------+----------+---------+---------------------+-----+   +-----------+--------+-----+--------+-----------+-----------------------------+ RIGHT      PSV cm/sRatioStenosisWaveform   Comments                      +-----------+--------+-----+--------+-----------+-----------------------------+ CIA Prox   115                  biphasic                                 +-----------+--------+-----+--------+-----------+-----------------------------+ CIA Mid    42  biphasic                                 +-----------+--------+-----+--------+-----------+-----------------------------+ CIA Distal                                 not visualized, dense bowel                                              gas                           +-----------+--------+-----+--------+-----------+-----------------------------+ EIA Prox   626                  stenotic   >50% stenosis, severe         +-----------+--------+-----+--------+-----------+-----------------------------+ EIA Mid    392                  monophasic >50% stenosis                 +-----------+--------+-----+--------+-----------+-----------------------------+ EIA Distal 161                                                           +-----------+--------+-----+--------+-----------+-----------------------------+ CFA Prox   130                  monophasic                                +-----------+--------+-----+--------+-----------+-----------------------------+ CFA Distal 80                   multiphasic                              +-----------+--------+-----+--------+-----------+-----------------------------+ DFA        217                  monophasic                               +-----------+--------+-----+--------+-----------+-----------------------------+ SFA Prox   0            occluded                                         +-----------+--------+-----+--------+-----------+-----------------------------+ SFA Mid    30                   monophasic trickle flow                  +-----------+--------+-----+--------+-----------+-----------------------------+ SFA Distal 28                   monophasic                               +-----------+--------+-----+--------+-----------+-----------------------------+  POP Prox   36                   monophasic                               +-----------+--------+-----+--------+-----------+-----------------------------+ POP Distal 28                   monophasic                               +-----------+--------+-----+--------+-----------+-----------------------------+ TP Trunk   30                   monophasic                               +-----------+--------+-----+--------+-----------+-----------------------------+ ATA Prox   25                   monophasic                               +-----------+--------+-----+--------+-----------+-----------------------------+ ATA Mid    17                   monophasic                               +-----------+--------+-----+--------+-----------+-----------------------------+ ATA Distal 14                   monophasic                               +-----------+--------+-----+--------+-----------+-----------------------------+ PTA Prox   19                   monophasic                                +-----------+--------+-----+--------+-----------+-----------------------------+ PTA Mid    24                   monophasic                               +-----------+--------+-----+--------+-----------+-----------------------------+ PTA Distal 14                   monophasic diminuitive flow              +-----------+--------+-----+--------+-----------+-----------------------------+ PERO Prox  28                   monophasic                               +-----------+--------+-----+--------+-----------+-----------------------------+ PERO Mid   17                   monophasic                               +-----------+--------+-----+--------+-----------+-----------------------------+ PERO Distal9  monophasic                               +-----------+--------+-----+--------+-----------+-----------------------------+  +-----------+--------+-----+---------------+---------+----------------------+ LEFT       PSV cm/sRatioStenosis       Waveform Comments               +-----------+--------+-----+---------------+---------+----------------------+ CIA Prox   108                         biphasic                        +-----------+--------+-----+---------------+---------+----------------------+ CIA Mid    139                         biphasic                        +-----------+--------+-----+---------------+---------+----------------------+ CIA Distal 120                         biphasic                        +-----------+--------+-----+---------------+---------+----------------------+ EIA Prox   257                         biphasic >50% stenosis, low-end +-----------+--------+-----+---------------+---------+----------------------+ EIA Mid    248                         biphasic                        +-----------+--------+-----+---------------+---------+----------------------+ EIA Distal 237                         triphasic                        +-----------+--------+-----+---------------+---------+----------------------+ CFA Prox   243          50-74% stenosistriphasic                       +-----------+--------+-----+---------------+---------+----------------------+ CFA Distal 184                         triphasic                       +-----------+--------+-----+---------------+---------+----------------------+ DFA        167                         biphasic                        +-----------+--------+-----+---------------+---------+----------------------+ SFA Prox   156                         triphasic                       +-----------+--------+-----+---------------+---------+----------------------+ SFA Mid    121                         triphasic                       +-----------+--------+-----+---------------+---------+----------------------+  SFA Distal 102                         triphasic                       +-----------+--------+-----+---------------+---------+----------------------+ POP Prox   75                          biphasic                        +-----------+--------+-----+---------------+---------+----------------------+ POP Distal 97                          biphasic                        +-----------+--------+-----+---------------+---------+----------------------+ TP Trunk   95                          biphasic                        +-----------+--------+-----+---------------+---------+----------------------+ ATA Prox   91                          biphasic                        +-----------+--------+-----+---------------+---------+----------------------+ ATA Mid    73                          biphasic                        +-----------+--------+-----+---------------+---------+----------------------+ ATA Distal 69                          biphasic                         +-----------+--------+-----+---------------+---------+----------------------+ PTA Prox   132                         biphasic                        +-----------+--------+-----+---------------+---------+----------------------+ PTA Mid    58                          biphasic                        +-----------+--------+-----+---------------+---------+----------------------+ PTA Distal 48                          biphasic                        +-----------+--------+-----+---------------+---------+----------------------+ PERO Prox  58                          triphasic                       +-----------+--------+-----+---------------+---------+----------------------+ PERO Mid   57  biphasic                        +-----------+--------+-----+---------------+---------+----------------------+ PERO Distal37                          biphasic                        +-----------+--------+-----+---------------+---------+----------------------+   Summary: Right: 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 Cannot rule out stenosis/occlusion of the distal common iliac artery due to limited visualization/bowel gas pocket. Occlusion of the ostial SFA. Three-vessel runoff. Left: >50% stenosis of the external iliac artery (low-end). 50-74% stenosis of the CFA (low-end). Three-vessel runoff.  See table(s) above for measurements and observations. See ABI report. Vascular consult recommended. Electronically signed by Larae Grooms MD on 04/03/2022 at 8:49:11 AM.    Final     Disposition   Pt is being discharged home today in good condition.  Follow-up Plans & Appointments     Follow-up Information     Lorretta Harp, MD Follow up on 05/14/2022.   Specialties: Cardiology, Radiology Why: Appointment at 11:30 AM Contact information: [address] [place] [address] [phone]                Discharge  Instructions     Diet - low sodium heart healthy   Complete by: As directed    Discharge instructions   Complete by: As directed    Groin Site Care Refer to this sheet in the next few weeks. These instructions provide you with information on caring for yourself after your procedure. Your caregiver may also give you more specific instructions. Your treatment has been planned according to current medical practices, but problems sometimes occur. Call your caregiver if you have any problems or questions after your procedure. HOME CARE INSTRUCTIONS You may shower 24 hours after the procedure. Remove the bandage (dressing) and gently wash the site with plain soap and water. Gently pat the site dry.  Do not apply powder or lotion to the site.  Do not sit in a bathtub, swimming pool, or whirlpool for 5 to 7 days.  No bending, squatting, or lifting anything over 10 pounds (4.5 kg) as directed by your caregiver.  Inspect the site at least twice daily.  Do not drive home if you are discharged the same day of the procedure. Have someone else drive you.  You may drive 24 hours after the procedure unless otherwise instructed by your caregiver.  What to expect: Any bruising will usually fade within 1 to 2 weeks.  Blood that collects in the tissue (hematoma) may be painful to the touch. It should usually decrease in size and tenderness within 1 to 2 weeks.  SEEK IMMEDIATE MEDICAL CARE IF: You have unusual pain at the groin site or down the affected leg.  You have redness, warmth, swelling, or pain at the groin site.  You have drainage (other than a small amount of blood on the dressing).  You have chills.  You have a fever or persistent symptoms for more than 72 hours.  You have a fever and your symptoms suddenly get worse.  Your leg becomes pale, cool, tingly, or numb.  You have heavy bleeding from the site. Hold pressure on the site. .   Increase activity slowly   Complete by: As directed  Discharge Medications   Allergies as of 04/29/2022   No Known Allergies      Medication List     TAKE these medications    albuterol 108 (90 Base) MCG/ACT inhaler Commonly known as: VENTOLIN HFA Inhale 2 puffs into the lungs every 6 (six) hours as needed for wheezing or shortness of breath.   amLODipine 10 MG tablet Commonly known as: NORVASC TAKE 1 TABLET (10 MG TOTAL) BY MOUTH DAILY.   Aspirin Low Dose 81 MG tablet Generic drug: aspirin EC Take 1 tablet (81 mg total) by mouth daily. Swallow whole. Start taking on: Apr 30, 2022   atorvastatin 20 MG tablet Commonly known as: LIPITOR TAKE 1 TABLET (20 MG TOTAL) BY MOUTH DAILY.   chlorthalidone 25 MG tablet Commonly known as: HYGROTON Take 0.5 tablets (12.5 mg total) by mouth daily.   clopidogrel 75 MG tablet Commonly known as: PLAVIX Take 1 tablet (75 mg total) by mouth daily with breakfast. Start taking on: Apr 30, 2022   diclofenac Sodium 1 % Gel Commonly known as: VOLTAREN APPLY 4 G TOPICALLY 4 (FOUR) TIMES DAILY. What changed:  when to take this reasons to take this   Flovent HFA 110 MCG/ACT inhaler Generic drug: fluticasone INHALE 2 PUFFS INTO THE LUNGS DAILY.   gabapentin 300 MG capsule Commonly known as: NEURONTIN TAKE 1 CAPSULE (300 MG TOTAL) BY MOUTH 3 (THREE) TIMES DAILY. What changed:  how much to take when to take this   losartan 100 MG tablet Commonly known as: COZAAR TAKE 1 TABLET (100 MG TOTAL) BY MOUTH DAILY.   multivitamin with minerals tablet Take 1 tablet by mouth daily.   pantoprazole 40 MG tablet Commonly known as: PROTONIX TAKE 1 TABLET (40 MG TOTAL) BY MOUTH DAILY.           Outstanding Labs/Studies   Duration of Discharge Encounter   Greater than 30 minutes including physician time.  Signed, Margie Billet, PA-C 04/29/2022, 10:35 AM  Personally seen and examined. Agree with above.  Feels well Warm extremities Ambulating well. Discussed meds as  above VBX covered stenting right extrailiac artery Eluvia stenting ostium/proximal right SFA CTO  OK for dc Candee Furbish, MD

## 2022-05-06 ENCOUNTER — Encounter (HOSPITAL_COMMUNITY): Payer: Self-pay | Admitting: Cardiovascular Disease

## 2022-05-07 ENCOUNTER — Other Ambulatory Visit (HOSPITAL_COMMUNITY): Payer: Self-pay | Admitting: Cardiovascular Disease

## 2022-05-07 DIAGNOSIS — I739 Peripheral vascular disease, unspecified: Secondary | ICD-10-CM

## 2022-05-07 DIAGNOSIS — Z95828 Presence of other vascular implants and grafts: Secondary | ICD-10-CM

## 2022-05-12 ENCOUNTER — Ambulatory Visit (HOSPITAL_COMMUNITY)
Admission: RE | Admit: 2022-05-12 | Discharge: 2022-05-12 | Disposition: A | Discharge status: Home / Self Care | Payer: Self-pay | Source: Ambulatory Visit | Attending: Cardiology | Admitting: Cardiology

## 2022-05-12 ENCOUNTER — Ambulatory Visit (HOSPITAL_COMMUNITY)
Admission: RE | Admit: 2022-05-12 | Discharge: 2022-05-12 | Disposition: A | Discharge status: Home / Self Care | Payer: Self-pay | Source: Ambulatory Visit | Attending: Cardiovascular Disease | Admitting: Cardiovascular Disease

## 2022-05-12 ENCOUNTER — Other Ambulatory Visit: Payer: Self-pay | Admitting: Cardiovascular Disease

## 2022-05-12 ENCOUNTER — Telehealth: Payer: Self-pay | Admitting: Licensed Clinical Social Worker

## 2022-05-12 DIAGNOSIS — Z95828 Presence of other vascular implants and grafts: Secondary | ICD-10-CM

## 2022-05-12 DIAGNOSIS — I739 Peripheral vascular disease, unspecified: Secondary | ICD-10-CM

## 2022-05-12 DIAGNOSIS — R0989 Other specified symptoms and signs involving the circulatory and respiratory systems: Secondary | ICD-10-CM | POA: Insufficient documentation

## 2022-05-12 NOTE — Telephone Encounter (Signed)
Attempted to reach pt this afternoon. He currently has CAFA, expires 05/13/2022, upcoming procedures scheduled with [facility]. Want to ensure pt has more up to date copy and ensure all other SDOH needs assessed. Message left for pt on preferred number [phone].  Note that this number is also listed for his wife, DPR for [name] on file.   Ruben Fuentes, MSW, LCSW Clinical Social Worker II [facility] Navigation  [phone]- work cell phone (preferred) [phone]- desk phone

## 2022-05-13 ENCOUNTER — Ambulatory Visit (INDEPENDENT_AMBULATORY_CARE_PROVIDER_SITE_OTHER): Payer: Self-pay | Admitting: Cardiovascular Disease

## 2022-05-13 ENCOUNTER — Telehealth: Payer: Self-pay | Admitting: Licensed Clinical Social Worker

## 2022-05-13 ENCOUNTER — Encounter: Payer: Self-pay | Admitting: Cardiovascular Disease

## 2022-05-13 DIAGNOSIS — I739 Peripheral vascular disease, unspecified: Secondary | ICD-10-CM

## 2022-05-13 NOTE — Patient Instructions (Signed)
Medication Instructions:  Your physician recommends that you continue on your current medications as directed. Please refer to the Current Medication list given to you today.  *If you need a refill on your cardiac medications before your next appointment, please call your pharmacy*   Testing/Procedures: Dr. Allyson Sabal has recommended that you have an Ultrasound of your AORTA/IVC/ILIACS.   To prepare for this test:  No food after 11PM the night before. Water is OK. (Don't drink liquids if you have been instructed not to for ANOTHER test).  Avoid foods that produce bowel gas, for 24 hours prior to exam (see below). No breakfast, no chewing gum, no smoking or carbonated beverages. Patient may take morning medications with water. Come in for test at least 15 minutes early to register.  Your physician has requested that you have a lower extremity arterial duplex. This test is an ultrasound of the arteries in the legs. It looks at arterial blood flow in the legs. Allow one hour for Lower Arterial scans. There are no restrictions or special instructions  Your physician has requested that you have an ankle brachial index (ABI). During this test an ultrasound and blood pressure cuff are used to evaluate the arteries that supply the arms and legs with blood. Allow thirty minutes for this exam. There are no restrictions or special instructions. To be done in December. These procedures will be done at [address] [facility]   Follow-Up: At [facility], you and your health needs are our priority.  As part of our continuing mission to provide you with exceptional heart care, we have created designated Provider Care Teams.  These Care Teams include your primary Cardiologist (physician) and Advanced Practice Providers (APPs -  Physician Assistants and Nurse Practitioners) who all work together to provide you with the care you need, when you need it.  We recommend signing up for the patient portal called  "MyChart".  Sign up information is provided on this After Visit Summary.  MyChart is used to connect with patients for Virtual Visits (Telemedicine).  Patients are able to view lab/test results, encounter notes, upcoming appointments, etc.  Non-urgent messages can be sent to your provider as well.   To learn more about what you can do with MyChart, go to [email or web address].    Your next appointment:   12 month(s)  The format for your next appointment:   In Person  Provider:   Nanetta Batty, MD

## 2022-05-13 NOTE — Assessment & Plan Note (Signed)
Ruben Fuentes was referred to me by Dr. Kenn File or evaluation of lifestyle-limiting claudication.  He had Dopplers performed/26/23 revealing right ABI 0.50 and left of 0.97.  I performed peripheral angiography on him 04/28/2022 revealing a 90% proximal right extrailiac artery stenosis and a short segment ostial/proximal right SFA CTO.  I stented his left extrailiac artery with a 7 mm x 29 mm long VBX stent and is SFA with an Eluvia drug-eluting stent.  His claudication has completely resolved and his ABIs have normalized.  He does have moderately elevated velocity in his right external iliac artery as well as a high velocity signal in his distal common femoral artery although his waveforms below that are triphasic.

## 2022-05-13 NOTE — Telephone Encounter (Signed)
LCSW received a call back from pt wife this afternoon.  Introduced self, role, reason for call. Pt wife states that they have re-applied for CAFA and sent in their resources/supporting documents needed. Pt wife is interested in [facility] application, I will provide to Dr. Hazle Coca RN Carma Lair. Will f/u as able with pt to ensure [employer] application has been submitted appropriately.    Octavio Graves, MSW, LCSW Clinical Social Worker II [facility] Navigation  [phone]- work cell phone (preferred) [phone]- desk phone

## 2022-05-13 NOTE — Progress Notes (Signed)
05/13/2022 [name]   [birth year]  [id number]  Primary Physician Gildardo Pounds, NP Primary Cardiologist: Lorretta Harp MD Lupe Carney, [place]  HPI:  Ruben Fuentes is a 61 y.o.  thin-appearing married African-American male father of 2 children with no grandchildren who is retired from doing sheet rock work.  He was referred by Dr. Gwendolyn Fill for PAD.  I last saw him in the office 04/15/2022.  He does have a history of continued tobacco abuse, treated hypertension and hyperlipidemia.  There is no family history of heart disease.  Never had heart attack or stroke.  He denies chest pain or shortness of breath.  He has had claudication for the last several months primarily involving his right leg with recent Doppler studies performed/26/23 revealing a right ABI of 0.50 and a left of 0.97.  He does have a high-frequency signal in his right extrailiac artery and a short segment CTO proximal right SFA.  He wishes to proceed with angiography and potential endovascular therapy.  I performed peripheral angiography on him 04/28/2022 revealing a 90% proximal right extrailiac artery stenosis which I stented with a 7 mm x 20 mm long VBX covered stent.  He had a short segment CTO of the ostium/proximal part of his right SFA which I was able to cross and stent with a 6 mm x 40 mm long Eluvia drug-eluting stent.  His ABI normalized.  His claudication has resolved.   Current Meds  Medication Sig   albuterol (VENTOLIN HFA) 108 (90 Base) MCG/ACT inhaler Inhale 2 puffs into the lungs every 6 (six) hours as needed for wheezing or shortness of breath.   amLODipine (NORVASC) 10 MG tablet TAKE 1 TABLET (10 MG TOTAL) BY MOUTH DAILY.   aspirin EC 81 MG tablet Take 1 tablet (81 mg total) by mouth daily. Swallow whole.   atorvastatin (LIPITOR) 20 MG tablet TAKE 1 TABLET (20 MG TOTAL) BY MOUTH DAILY.   chlorthalidone (HYGROTON) 25 MG tablet Take 0.5 tablets (12.5 mg total) by mouth daily.   clopidogrel  (PLAVIX) 75 MG tablet Take 1 tablet (75 mg total) by mouth daily with breakfast.   diclofenac Sodium (VOLTAREN) 1 % GEL APPLY 4 G TOPICALLY 4 (FOUR) TIMES DAILY. (Patient taking differently: Apply 4 g topically 4 (four) times daily as needed (pain).)   fluticasone (FLOVENT HFA) 110 MCG/ACT inhaler INHALE 2 PUFFS INTO THE LUNGS DAILY.   gabapentin (NEURONTIN) 300 MG capsule TAKE 1 CAPSULE (300 MG TOTAL) BY MOUTH 3 (THREE) TIMES DAILY. (Patient taking differently: Take 300 mg by mouth daily.)   losartan (COZAAR) 100 MG tablet TAKE 1 TABLET (100 MG TOTAL) BY MOUTH DAILY.   Multiple Vitamins-Minerals (MULTIVITAMIN WITH MINERALS) tablet Take 1 tablet by mouth daily.   pantoprazole (PROTONIX) 40 MG tablet TAKE 1 TABLET (40 MG TOTAL) BY MOUTH DAILY.     No Known Allergies  Social History   Socioeconomic History   Marital status: Married    Spouse name: Ruben Fuentes   Number of children: 2   Years of education: Not on file   Highest education level: Not on file  Occupational History   Not on file  Tobacco Use   Smoking status: Every Day    Packs/day: 0.10    Years: 30.00    Pack years: 3.00    Types: Cigarettes   Smokeless tobacco: Never  Vaping Use   Vaping Use: Never used  Substance and Sexual Activity   Alcohol use:  Yes    Alcohol/week: 2.0 standard drinks    Types: 2 Cans of beer per week    Comment: up to 5 beers a day   Drug use: Yes    Types: Marijuana    Comment: 2 cigs of it a day   Sexual activity: Not Currently  Other Topics Concern   Not on file  Social History Narrative   Not on file   Social Determinants of Health   Financial Resource Strain: Not on file  Food Insecurity: Not on file  Transportation Needs: Not on file  Physical Activity: Not on file  Stress: Not on file  Social Connections: Not on file  Intimate Partner Violence: Not on file     Review of Systems: General: negative for chills, fever, night sweats or weight changes.   Cardiovascular: negative for chest pain, dyspnea on exertion, edema, orthopnea, palpitations, paroxysmal nocturnal dyspnea or shortness of breath Dermatological: negative for rash Respiratory: negative for cough or wheezing Urologic: negative for hematuria Abdominal: negative for nausea, vomiting, diarrhea, bright red blood per rectum, melena, or hematemesis Neurologic: negative for visual changes, syncope, or dizziness All other systems reviewed and are otherwise negative except as noted above.    Blood pressure (!) 114/58, pulse 80, height 5\' 7"  (1.702 m), weight 123 lb 12.8 oz (56.2 kg), SpO2 99 %.  General appearance: alert and no distress Neck: no adenopathy, no carotid bruit, no JVD, supple, symmetrical, trachea midline, and thyroid not enlarged, symmetric, no tenderness/mass/nodules Lungs: clear to auscultation bilaterally Heart: regular rate and rhythm, S1, S2 normal, no murmur, click, rub or gallop Extremities: extremities normal, atraumatic, no cyanosis or edema Pulses: 2+ and symmetric Skin: Skin color, texture, turgor normal. No rashes or lesions Neurologic: Grossly normal  EKG not performed today  ASSESSMENT AND PLAN:   Claudication in peripheral vascular disease [facility]) Mr. Hernandezgarci was referred to me by Dr. Gwendolyn Fill or evaluation of lifestyle-limiting claudication.  He had Dopplers performed/26/23 revealing right ABI 0.50 and left of 0.97.  I performed peripheral angiography on him 04/28/2022 revealing a 90% proximal right extrailiac artery stenosis and a short segment ostial/proximal right SFA CTO.  I stented his left extrailiac artery with a 7 mm x 29 mm long VBX stent and is SFA with an Eluvia drug-eluting stent.  His claudication has completely resolved and his ABIs have normalized.  He does have moderately elevated velocity in his right external iliac artery as well as a high velocity signal in his distal common femoral artery although his waveforms below that are  triphasic.     Lorretta Harp MD FACP,FACC,FAHA, [facility] 05/13/2022 3:23 PM

## 2022-05-14 ENCOUNTER — Ambulatory Visit: Payer: Self-pay | Admitting: Cardiovascular Disease

## 2022-05-22 ENCOUNTER — Other Ambulatory Visit: Payer: Self-pay

## 2022-05-30 ENCOUNTER — Telehealth: Payer: Self-pay | Admitting: Licensed Clinical Social Worker

## 2022-05-30 NOTE — Telephone Encounter (Signed)
H&V Care Navigation CSW Progress Note  Clinical Social Worker contacted caregiver by phone to f/u on [employer]ist application. No answer at [phone], voicemail left requesting call back with any updates. Will re-attempt as able.  Patient is participating in a Managed Medicaid Plan:  No, self pay only. CAFA and [employer]  SDOH Screenings   Alcohol Screen: Not on file  Depression (PHQ2-9): Low Risk  (01/15/2022)   Depression (PHQ2-9)    PHQ-2 Score: 0  Financial Resource Strain: Not on file  Food Insecurity: Not on file  Housing: Not on file  Physical Activity: Not on file  Social Connections: Not on file  Stress: Not on file  Tobacco Use: High Risk (05/13/2022)   Patient History    Smoking Tobacco Use: Every Day    Smokeless Tobacco Use: Never    Passive Exposure: Not on file  Transportation Needs: Not on file   Octavio Graves, MSW, LCSW Clinical Social Worker II [facility] Heart/Vascular Care Navigation  [phone]- work cell phone (preferred) [phone]- desk phone

## 2022-06-02 ENCOUNTER — Telehealth: Payer: Self-pay | Admitting: Licensed Clinical Social Worker

## 2022-06-02 NOTE — Telephone Encounter (Signed)
H&V Care Navigation CSW Progress Note  10:42am- Clinical Social Worker  contacted by phone by pt caregiver  to f/u on [employer]ed[employer]ist. Pt spouse [name] contacted me back from [phone]. Pt wife shares they received approval for [facility], inquired how to get medications sent in. Pt wife also shares that they received a letter stating that pt needs to contact [employer], she has done so but hasn't heard back. She is also inquiring about CAFA since it has been some time since she has sent that in.   LCSW will reach out to PCP office ([facility]) since most medications are managed by that office at this time. LCSW also will reach out to [facility], supervisor with [employer] and Mellody Dance w/ [employer] to receive updates regarding CAFA and [employer] Letter. No additional questions/concerns at this time from pt wife.   12:56pm- I reached out to [place] who shared that her team is trying to get in contact with them and will continue to try, return pt wife call as able. LCSW also sent message to [place] with [employer] but no response yet at this time.   Patient is participating in a Managed Medicaid Plan:  No, self pay only (CAFA/[place] [employer] pending)  SDOH Screenings   Alcohol Screen: Not on file  Depression (PHQ2-9): Low Risk  (01/15/2022)   Depression (PHQ2-9)    PHQ-2 Score: 0  Financial Resource Strain: Medium Risk (06/02/2022)   Overall Financial Resource Strain (CARDIA)    Difficulty of Paying Living Expenses: Somewhat hard  Food Insecurity: Not on file  Housing: Not on file  Physical Activity: Not on file  Social Connections: Not on file  Stress: Not on file  Tobacco Use: High Risk (05/13/2022)   Patient History    Smoking Tobacco Use: Every Day    Smokeless Tobacco Use: Never    Passive Exposure: Not on file  Transportation Needs: No Transportation Needs (06/02/2022)   PRAPARE - Transportation    Lack of Transportation (Medical): No    Lack of Transportation (Non-Medical): No

## 2022-06-03 ENCOUNTER — Other Ambulatory Visit: Payer: Self-pay | Admitting: Pharmacist

## 2022-06-03 ENCOUNTER — Telehealth: Payer: Self-pay | Admitting: Licensed Clinical Social Worker

## 2022-06-03 DIAGNOSIS — I10 Essential (primary) hypertension: Secondary | ICD-10-CM

## 2022-06-03 DIAGNOSIS — E782 Mixed hyperlipidemia: Secondary | ICD-10-CM

## 2022-06-03 MED ORDER — AMLODIPINE BESYLATE 10 MG PO TABS: ORAL_TABLET | Freq: Every day | ORAL | 0 refills | Status: DC

## 2022-06-03 MED ORDER — ASPIRIN 81 MG PO TBEC: 81.0000 mg | DELAYED_RELEASE_TABLET | Freq: Every day | ORAL | 0 refills | Status: DC

## 2022-06-03 MED ORDER — CLOPIDOGREL BISULFATE 75 MG PO TABS: 75.0000 mg | ORAL_TABLET | Freq: Every day | ORAL | 0 refills | Status: DC

## 2022-06-03 MED ORDER — LOSARTAN POTASSIUM 100 MG PO TABS: ORAL_TABLET | Freq: Every day | ORAL | 0 refills | Status: DC

## 2022-06-03 MED ORDER — ATORVASTATIN CALCIUM 20 MG PO TABS: ORAL_TABLET | Freq: Every day | ORAL | 0 refills | Status: DC

## 2022-06-19 ENCOUNTER — Telehealth: Payer: Self-pay | Admitting: Licensed Clinical Social Worker

## 2022-06-19 NOTE — Telephone Encounter (Signed)
H&V Care Navigation CSW Progress Note  Clinical Social Worker contacted caregiver by phone to f/u on CAFA processing. Pt did not have any outstanding accounts when he submitted documents. Will need to resubmit after his appt in August. This was left on pt wife [name]'s voicemail along with my number should she wish further clarity, will mail new application and previously submitted documents that are still valid to resubmit. I remain available as needed and will f/u closer to that time.   Patient is participating in a Managed Medicaid Plan:  No, self pay only.   SDOH Screenings   Alcohol Screen: Not on file  Depression (PHQ2-9): Low Risk  (01/15/2022)   Depression (PHQ2-9)    PHQ-2 Score: 0  Financial Resource Strain: Medium Risk (06/02/2022)   Overall Financial Resource Strain (CARDIA)    Difficulty of Paying Living Expenses: Somewhat hard  Food Insecurity: Not on file  Housing: Not on file  Physical Activity: Not on file  Social Connections: Not on file  Stress: Not on file  Tobacco Use: High Risk (05/13/2022)   Patient History    Smoking Tobacco Use: Every Day    Smokeless Tobacco Use: Never    Passive Exposure: Not on file  Transportation Needs: No Transportation Needs (06/02/2022)   PRAPARE - Transportation    Lack of Transportation (Medical): No    Lack of Transportation (Non-Medical): No    Ruben Fuentes, MSW, LCSW Clinical Social Worker II [facility] Heart/Vascular Care Navigation  [phone]- work cell phone (preferred) [phone]- desk phone

## 2022-06-19 NOTE — Telephone Encounter (Signed)
LCSW received call back from pt wife [name], I explained the information left on voicemail and clarified. I will be in touch with pt wife at end of the month for them to resubmit paperwork for CAFA. Pt wife encouraged to call me with any questions before that time as needed.  Octavio Graves, MSW, LCSW Clinical Social Worker II [facility] Navigation  [phone]- work cell phone (preferred) [phone]- desk phone

## 2022-06-27 ENCOUNTER — Other Ambulatory Visit: Payer: Self-pay

## 2022-07-03 ENCOUNTER — Telehealth: Payer: Self-pay | Admitting: Licensed Clinical Social Worker

## 2022-07-03 NOTE — Telephone Encounter (Signed)
H&V Care Navigation CSW Progress Note  Clinical Social Worker  was contacted by pt caregiver (wife [name]- [phone])  to f/u on paperwork received from [place] w/ financial counseling. She had tried to contact [place] back but hadnt been able to. I reviewed chart and contacted [place] as from my understanding pt had been screened ineligible until he had an outstanding account for CAFA. Suzette Battiest confirmed this, will reach out to pt wife and let her know that pt needs to re-submit CAFA post appt in august, this writer is able to assist with that since we were assisting.   Patient is participating in a Managed Medicaid Plan:  No, self pay only  SDOH Screenings   Alcohol Screen: Not on file  Depression (PHQ2-9): Low Risk  (01/15/2022)   Depression (PHQ2-9)    PHQ-2 Score: 0  Financial Resource Strain: Medium Risk (06/02/2022)   Overall Financial Resource Strain (CARDIA)    Difficulty of Paying Living Expenses: Somewhat hard  Food Insecurity: Not on file  Housing: Not on file  Physical Activity: Not on file  Social Connections: Not on file  Stress: Not on file  Tobacco Use: High Risk (05/13/2022)   Patient History    Smoking Tobacco Use: Every Day    Smokeless Tobacco Use: Never    Passive Exposure: Not on file  Transportation Needs: No Transportation Needs (06/02/2022)   PRAPARE - Transportation    Lack of Transportation (Medical): No    Lack of Transportation (Non-Medical): No   Octavio Graves, MSW, LCSW Clinical Social Worker II [facility] Health Heart/Vascular Care Navigation  [phone]- work cell phone (preferred) [phone]- desk phone

## 2022-07-08 ENCOUNTER — Telehealth: Payer: Self-pay | Admitting: Licensed Clinical Social Worker

## 2022-07-08 NOTE — Telephone Encounter (Signed)
H&V Care Navigation CSW Progress Note  Clinical Social Worker  contacted patient by mail  to send new CAFA application. Previous one was missing documents and could not be submitted due to no outstanding balances. Pt wife has been advised to call me with any questions/concerns.   Patient is participating in a Managed Medicaid Plan:  no, self pay only.   SDOH Screenings   Alcohol Screen: Not on file  Depression (PHQ2-9): Low Risk  (01/15/2022)   Depression (PHQ2-9)    PHQ-2 Score: 0  Financial Resource Strain: Medium Risk (06/02/2022)   Overall Financial Resource Strain (CARDIA)    Difficulty of Paying Living Expenses: Somewhat hard  Food Insecurity: Not on file  Housing: Not on file  Physical Activity: Not on file  Social Connections: Not on file  Stress: Not on file  Tobacco Use: High Risk (05/13/2022)   Patient History    Smoking Tobacco Use: Every Day    Smokeless Tobacco Use: Never    Passive Exposure: Not on file  Transportation Needs: No Transportation Needs (06/02/2022)   PRAPARE - Transportation    Lack of Transportation (Medical): No    Lack of Transportation (Non-Medical): No    Octavio Graves, MSW, LCSW Clinical Social Worker II [facility] Health Heart/Vascular Care Navigation  [phone]- work cell phone (preferred) [phone]- desk phone

## 2022-07-09 ENCOUNTER — Other Ambulatory Visit: Payer: Self-pay

## 2022-07-14 ENCOUNTER — Telehealth: Payer: Self-pay | Admitting: Licensed Clinical Social Worker

## 2022-07-14 NOTE — Telephone Encounter (Signed)
H&V Care Navigation CSW Progress Note  1:56pm- Clinical Social Worker contacted caregiver by phone to f/u on new CAFA. Pt application had been placed as ineligible at that time as he had no open accounts. Pt has an upcoming appt on 8/9 and was mailed a new application with needed documents noted. Pt spouse did not answer today at [phone] but I have left her a voicemail.  3:21pm- pt spouse returned my call, she is working on [employer]. Will return it to financial counseling department when completed after his appt on 8/9. Pt wife will call me if any questions/concerns.   Patient is participating in a Managed Medicaid Plan:  No, self pay only  SDOH Screenings   Alcohol Screen: Not on file  Depression (PHQ2-9): Low Risk  (01/15/2022)   Depression (PHQ2-9)    PHQ-2 Score: 0  Financial Resource Strain: Medium Risk (06/02/2022)   Overall Financial Resource Strain (CARDIA)    Difficulty of Paying Living Expenses: Somewhat hard  Food Insecurity: Not on file  Housing: Not on file  Physical Activity: Not on file  Social Connections: Not on file  Stress: Not on file  Tobacco Use: High Risk (05/13/2022)   Patient History    Smoking Tobacco Use: Every Day    Smokeless Tobacco Use: Never    Passive Exposure: Not on file  Transportation Needs: No Transportation Needs (06/02/2022)   PRAPARE - Transportation    Lack of Transportation (Medical): No    Lack of Transportation (Non-Medical): No   Ruben Fuentes, MSW, LCSW Clinical Social Worker II [facility] Health Heart/Vascular Care Navigation  [phone]- work cell phone (preferred) [phone]- desk phone

## 2022-07-16 ENCOUNTER — Encounter: Payer: Self-pay | Admitting: Nurse Practitioner

## 2022-07-16 ENCOUNTER — Ambulatory Visit: Payer: Self-pay | Attending: Nurse Practitioner | Admitting: Nurse Practitioner

## 2022-07-16 ENCOUNTER — Other Ambulatory Visit: Payer: Self-pay

## 2022-07-16 ENCOUNTER — Other Ambulatory Visit: Payer: Self-pay | Admitting: Nurse Practitioner

## 2022-07-16 ENCOUNTER — Other Ambulatory Visit: Payer: Self-pay | Admitting: Physician Assistant

## 2022-07-16 VITALS — BP 126/78 | HR 74 | Temp 98.2°F | Ht 67.0 in | Wt 122.2 lb

## 2022-07-16 DIAGNOSIS — K219 Gastro-esophageal reflux disease without esophagitis: Secondary | ICD-10-CM

## 2022-07-16 DIAGNOSIS — E782 Mixed hyperlipidemia: Secondary | ICD-10-CM

## 2022-07-16 DIAGNOSIS — I10 Essential (primary) hypertension: Secondary | ICD-10-CM

## 2022-07-16 MED ORDER — ATORVASTATIN CALCIUM 20 MG PO TABS
ORAL_TABLET | Freq: Every day | ORAL | 3 refills | Status: DC
  Filled 2022-07-16: qty 30, 30d supply, fill #0
  Filled 2022-09-30: qty 30, 30d supply, fill #1

## 2022-07-16 MED ORDER — PANTOPRAZOLE SODIUM 40 MG PO TBEC
DELAYED_RELEASE_TABLET | Freq: Every day | ORAL | 2 refills | Status: DC
  Filled 2022-07-16: qty 30, 30d supply, fill #0
  Filled 2022-08-20: qty 30, 30d supply, fill #1
  Filled 2022-09-30: qty 30, 30d supply, fill #2

## 2022-07-16 MED ORDER — CHLORTHALIDONE 25 MG PO TABS
12.5000 mg | ORAL_TABLET | Freq: Every day | ORAL | 1 refills | Status: DC
  Filled 2022-07-16 (×2): qty 45, 90d supply, fill #0

## 2022-07-16 NOTE — Progress Notes (Signed)
Pt requesting RF on Hygroton 25mg .

## 2022-07-16 NOTE — Telephone Encounter (Signed)
Requested medications are due for refill today.  yes  Requested medications are on the active medications list.  yes  Last refill. 05/29/2021 #90 3 refills  Future visit scheduled.   yes  Notes to clinic.  Rx written to expired 06/28/2022 - Rx is expired.    Requested Prescriptions  Pending Prescriptions Disp Refills   pantoprazole (PROTONIX) 40 MG tablet 90 tablet 3    Sig: TAKE 1 TABLET (40 MG TOTAL) BY MOUTH DAILY.     Gastroenterology: Proton Pump Inhibitors Passed - 07/16/2022  9:45 AM      Passed - Valid encounter within last 12 months    Recent Outpatient Visits           Today Essential hypertension   [facility] [place], Shea Stakes, NP   6 months ago Essential hypertension   [facility] [place], Marylene Land M, [place]   11 months ago Primary hypertension   [place] [address] [facility] [place], Shea Stakes, NP   11 months ago Primary hypertension   [place] [address] [facility] [place], Shea Stakes, NP   1 year ago Essential hypertension   [facility] [place], Shea Stakes, NP       Future Appointments             In 3 months Claiborne Rigg, NP [facility]

## 2022-07-16 NOTE — Progress Notes (Signed)
Assessment & Plan:  Ruben Fuentes was seen today for hypertension.  Diagnoses and all orders for this visit:  Essential hypertension -     CMP14+EGFR -     chlorthalidone (HYGROTON) 25 MG tablet; Take 0.5 tablets (12.5 mg total) by mouth daily.  Hyperlipidemia, mixed -     atorvastatin (LIPITOR) 20 MG tablet; TAKE 1 TABLET (20 MG TOTAL) BY MOUTH DAILY.    Patient has been counseled on age-appropriate routine health concerns for screening and prevention. These are reviewed and up-to-date. Referrals have been placed accordingly. Immunizations are up-to-date or declined.    Subjective:   Chief Complaint  Patient presents with   Hypertension   HPI Ruben Fuentes 61 y.o. male presents to office today for follow up to HTN. He is accompanied by his wife today.  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  HTN Blood pressure is well controlled. He is taking losartan 100 mg daily and amlodipine 10 mg daily as prescribed.  BP Readings from Last 3 Encounters:  07/16/22 126/78  05/13/22 (!) 114/58  04/29/22 (!) 158/80    Hyperlipidemia Currently at goal. Taking atorvastatin 10 mg daily.  Lab Results  Component Value Date   CHOL 157 04/29/2022   CHOL 173 01/15/2022   CHOL 168 05/30/2021   Lab Results  Component Value Date   HDL 48 04/29/2022   HDL 56 01/15/2022   HDL 57 05/30/2021   Lab Results  Component Value Date   LDLCALC 92 04/29/2022   LDLCALC 102 (H) 01/15/2022   LDLCALC 100 (H) 05/30/2021   Lab Results  Component Value Date   TRIG 86 04/29/2022   TRIG 81 01/15/2022   TRIG 56 05/30/2021   Lab Results  Component Value Date   CHOLHDL 3.3 04/29/2022   CHOLHDL 3.1 01/15/2022   CHOLHDL 2.9 05/30/2021   No results found for:  "LDLDIRECT"  ROS  Past Medical History:  Diagnosis Date   Arthritis    Bursitis    Hypertension     Past Surgical History:  Procedure Laterality Date   ABDOMINAL AORTOGRAM W/LOWER EXTREMITY N/A 04/28/2022   Procedure: ABDOMINAL AORTOGRAM W/LOWER EXTREMITY;  Surgeon: Lorretta Harp, MD;  Location: [facility];  Service: Cardiovascular;  Laterality: N/A;   FRACTURE SURGERY     collar bones   FRACTURE SURGERY     right ankle   PERIPHERAL VASCULAR INTERVENTION  04/28/2022   Procedure: PERIPHERAL VASCULAR INTERVENTION;  Surgeon: Lorretta Harp, MD;  Location: [facility];  Service: Cardiovascular;;    Family History  Problem Relation Age of Onset   Hypertension Mother    Hypertension Father    Rheum arthritis Sister    Diabetes Brother    Colon cancer Neg Hx    Esophageal cancer Neg Hx     Social History Reviewed with no changes to be made today.   Outpatient Medications Prior to Visit  Medication Sig Dispense Refill   albuterol (VENTOLIN HFA) 108 (90 Base) MCG/ACT inhaler Inhale 2 puffs into the lungs every 6 (six) hours as needed for wheezing or shortness of breath. 18 g 2   amLODipine (NORVASC) 10 MG tablet TAKE 1 TABLET (10 MG TOTAL) BY MOUTH DAILY. 90 tablet 0   aspirin EC 81 MG  tablet Take 1 tablet (81 mg total) by mouth daily. Swallow whole. 90 tablet 0   clopidogrel (PLAVIX) 75 MG tablet Take 1 tablet (75 mg total) by mouth daily with breakfast. 90 tablet 0   diclofenac Sodium (VOLTAREN) 1 % GEL APPLY 4 G TOPICALLY 4 (FOUR) TIMES DAILY. (Patient taking differently: Apply 4 g topically 4 (four) times daily as needed (pain).) 350 g 3   fluticasone (FLOVENT HFA) 110 MCG/ACT inhaler INHALE 2 PUFFS INTO THE LUNGS DAILY. 12 g 6   gabapentin (NEURONTIN) 300 MG capsule TAKE 1 CAPSULE (300 MG TOTAL) BY MOUTH 3 (THREE) TIMES DAILY. (Patient taking differently: Take 300 mg by mouth daily.) 270 capsule 1   losartan (COZAAR) 100 MG tablet TAKE 1 TABLET (100 MG  TOTAL) BY MOUTH DAILY. 90 tablet 0   Multiple Vitamins-Minerals (MULTIVITAMIN WITH MINERALS) tablet Take 1 tablet by mouth daily.     atorvastatin (LIPITOR) 20 MG tablet TAKE 1 TABLET (20 MG TOTAL) BY MOUTH DAILY. 90 tablet 0   chlorthalidone (HYGROTON) 25 MG tablet Take 0.5 tablets (12.5 mg total) by mouth daily. 45 tablet 1   pantoprazole (PROTONIX) 40 MG tablet TAKE 1 TABLET (40 MG TOTAL) BY MOUTH DAILY. 90 tablet 3   No facility-administered medications prior to visit.    No Known Allergies     Objective:    BP 126/78   Pulse 74   Temp 98.2 F (36.8 C) (Oral)   Ht '5\' 7"'  (1.702 m)   Wt 122 lb 3.2 oz (55.4 kg)   SpO2 99%   BMI 19.14 kg/m  Wt Readings from Last 3 Encounters:  07/16/22 122 lb 3.2 oz (55.4 kg)  05/13/22 123 lb 12.8 oz (56.2 kg)  04/28/22 120 lb 9.6 oz (54.7 kg)    Physical Exam Vitals and nursing note reviewed.  Constitutional:      Appearance: He is well-developed.  HENT:     Head: Normocephalic and atraumatic.  Cardiovascular:     Rate and Rhythm: Normal rate and regular rhythm.     Heart sounds: Normal heart sounds. No murmur heard.    No friction rub. No gallop.  Pulmonary:     Effort: Pulmonary effort is normal. No tachypnea or respiratory distress.     Breath sounds: Normal breath sounds. No decreased breath sounds, wheezing, rhonchi or rales.  Chest:     Chest wall: No tenderness.  Abdominal:     General: Bowel sounds are normal.     Palpations: Abdomen is soft.  Musculoskeletal:        General: Normal range of motion.     Cervical back: Normal range of motion.  Skin:    General: Skin is warm and dry.  Neurological:     Mental Status: He is alert and oriented to person, place, and time.     Coordination: Coordination normal.  Psychiatric:        Behavior: Behavior normal. Behavior is cooperative.        Thought Content: Thought content normal.        Judgment: Judgment normal.          Patient has been counseled extensively about  nutrition and exercise as well as the importance of adherence with medications and regular follow-up. The patient was given clear instructions to go to ER or return to medical center if symptoms don't improve, worsen or new problems develop. The patient verbalized understanding.   Follow-up: Return in about 3 months (around 10/16/2022).   Vernia Buff  Raul Del, FNP-BC [facility] [place], [place]   07/16/2022, 1:51 PM

## 2022-07-17 ENCOUNTER — Other Ambulatory Visit: Payer: Self-pay | Admitting: Nurse Practitioner

## 2022-07-17 DIAGNOSIS — E871 Hypo-osmolality and hyponatremia: Secondary | ICD-10-CM

## 2022-07-17 LAB — CMP14+EGFR
ALT: 21 IU/L (ref 0–44)
AST: 27 IU/L (ref 0–40)
Albumin/Globulin Ratio: 1.7 (ref 1.2–2.2)
Albumin: 4.6 g/dL (ref 3.8–4.9)
Alkaline Phosphatase: 82 IU/L (ref 44–121)
BUN/Creatinine Ratio: 8 — ABNORMAL LOW (ref 10–24)
BUN: 12 mg/dL (ref 8–27)
Bilirubin Total: 0.2 mg/dL (ref 0.0–1.2)
CO2: 20 mmol/L (ref 20–29)
Calcium: 9.5 mg/dL (ref 8.6–10.2)
Chloride: 95 mmol/L — ABNORMAL LOW (ref 96–106)
Creatinine, Ser: 1.49 mg/dL — ABNORMAL HIGH (ref 0.76–1.27)
Globulin, Total: 2.7 g/dL (ref 1.5–4.5)
Glucose: 115 mg/dL — ABNORMAL HIGH (ref 70–99)
Potassium: 4 mmol/L (ref 3.5–5.2)
Sodium: 131 mmol/L — ABNORMAL LOW (ref 134–144)
Total Protein: 7.3 g/dL (ref 6.0–8.5)
eGFR: 53 mL/min/{1.73_m2} — ABNORMAL LOW (ref >59)

## 2022-07-18 ENCOUNTER — Other Ambulatory Visit: Payer: Self-pay

## 2022-08-05 ENCOUNTER — Ambulatory Visit: Payer: Self-pay | Attending: Nurse Practitioner

## 2022-08-05 ENCOUNTER — Other Ambulatory Visit: Payer: Self-pay

## 2022-08-06 LAB — BASIC METABOLIC PANEL
BUN/Creatinine Ratio: 9 — ABNORMAL LOW (ref 10–24)
BUN: 11 mg/dL (ref 8–27)
CO2: 22 mmol/L (ref 20–29)
Calcium: 9.6 mg/dL (ref 8.6–10.2)
Chloride: 104 mmol/L (ref 96–106)
Creatinine, Ser: 1.17 mg/dL (ref 0.76–1.27)
Glucose: 101 mg/dL — ABNORMAL HIGH (ref 70–99)
Potassium: 4.2 mmol/L (ref 3.5–5.2)
Sodium: 141 mmol/L (ref 134–144)
eGFR: 71 mL/min/{1.73_m2} (ref >59)

## 2022-08-13 ENCOUNTER — Ambulatory Visit: Payer: Self-pay | Attending: Nurse Practitioner

## 2022-08-13 DIAGNOSIS — Z23 Encounter for immunization: Secondary | ICD-10-CM

## 2022-08-15 ENCOUNTER — Telehealth: Payer: Self-pay | Admitting: Licensed Clinical Social Worker

## 2022-08-15 NOTE — Telephone Encounter (Signed)
H&V Care Navigation CSW Progress Note  Clinical Social Worker  mailed pt an extra copy of CAFA approval  to bring with him to upcoming appointments. Pt was approved for 100%, will add to upcoming appt notes.   PATIENT APPROVED FOR 100% FINANCIAL ASSISTANCE PER CAFA APPLICATION AND SUPPORTING DOCUMENTATION.  CAFA APP SCANNED TO ACCOUNT [id number] APPROVAL DATES OF [employer] ---- 07/16/22 - 01/16/23  APPROVAL LETTER ON ACCOUNT [id number]  Patient is participating in a Managed Medicaid Plan:  No, self pay. CAFA approved.   SDOH Screenings   Transportation Needs: No Transportation Needs (06/02/2022)  Depression (PHQ2-9): Low Risk  (07/16/2022)  Financial Resource Strain: Medium Risk (06/02/2022)  Tobacco Use: High Risk (07/16/2022)   Octavio Graves, MSW, LCSW Clinical Social Worker II [facility] Health Heart/Vascular Care Navigation  [phone]- work cell phone (preferred) [phone]- desk phone

## 2022-08-20 ENCOUNTER — Other Ambulatory Visit: Payer: Self-pay

## 2022-08-21 ENCOUNTER — Other Ambulatory Visit: Payer: Self-pay

## 2022-09-19 ENCOUNTER — Other Ambulatory Visit: Payer: Self-pay

## 2022-09-30 ENCOUNTER — Other Ambulatory Visit: Payer: Self-pay

## 2022-09-30 ENCOUNTER — Other Ambulatory Visit: Payer: Self-pay | Admitting: Nurse Practitioner

## 2022-09-30 DIAGNOSIS — E782 Mixed hyperlipidemia: Secondary | ICD-10-CM

## 2022-09-30 DIAGNOSIS — I10 Essential (primary) hypertension: Secondary | ICD-10-CM

## 2022-09-30 NOTE — Telephone Encounter (Signed)
Medication Refill - Medication:   Amlodipine 10 mg Atorvastatin 20 mg Clopidogrel 75 mg Losartan 100 mg Asprin 81 mg  as the patient contacted their pharmacy? Yes.   (Agent: If no, request that the patient contact the pharmacy for the refill. If patient does not wish to contact the pharmacy document the reason why and proceed with request.) (Agent: If yes, when and what did the pharmacy advise?)  Preferred Pharmacy (with phone number or street name): [facility]  [phone]  Has the patient been seen for an appointment in the last year OR does the patient have an upcoming appointment? Yes.    Agent: Please be advised that RX refills may take up to 3 business days. We ask that you follow-up with your pharmacy.

## 2022-10-01 ENCOUNTER — Other Ambulatory Visit: Payer: Self-pay

## 2022-10-01 ENCOUNTER — Other Ambulatory Visit (HOSPITAL_BASED_OUTPATIENT_CLINIC_OR_DEPARTMENT_OTHER): Payer: Self-pay

## 2022-10-01 MED ORDER — ASPIRIN 81 MG PO TBEC: 81.0000 mg | DELAYED_RELEASE_TABLET | Freq: Every day | ORAL | 0 refills | Status: DC

## 2022-10-01 MED ORDER — LOSARTAN POTASSIUM 100 MG PO TABS: ORAL_TABLET | Freq: Every day | ORAL | 0 refills | Status: DC

## 2022-10-01 MED ORDER — CLOPIDOGREL BISULFATE 75 MG PO TABS: 75.0000 mg | ORAL_TABLET | Freq: Every day | ORAL | 0 refills | Status: DC

## 2022-10-01 MED ORDER — AMLODIPINE BESYLATE 10 MG PO TABS: ORAL_TABLET | Freq: Every day | ORAL | 0 refills | Status: DC

## 2022-10-01 NOTE — Telephone Encounter (Signed)
Unable to refill per protocol, request is too soon, last refill 07/16/22 for 90 and 3 RF. Will refuse.  Requested Prescriptions  Pending Prescriptions Disp Refills  . atorvastatin (LIPITOR) 20 MG tablet 90 tablet 3    Sig: TAKE 1 TABLET (20 MG TOTAL) BY MOUTH DAILY.     Cardiovascular:  Antilipid - Statins Failed - 09/30/2022  4:45 PM      Failed - Lipid Panel in normal range within the last 12 months    Cholesterol, Total  Date Value Ref Range Status  01/15/2022 173 100 - 199 mg/dL Final   Cholesterol  Date Value Ref Range Status  04/29/2022 157 0 - 200 mg/dL Final   LDL Chol Calc (NIH)  Date Value Ref Range Status  01/15/2022 102 (H) 0 - 99 mg/dL Final   LDL Cholesterol  Date Value Ref Range Status  04/29/2022 92 0 - 99 mg/dL Final    Comment:           Total Cholesterol/HDL:CHD Risk Coronary Heart Disease Risk Table                     Men   Women  1/2 Average Risk   3.4   3.3  Average Risk       5.0   4.4  2 X Average Risk   9.6   7.1  3 X Average Risk  23.4   11.0        Use the calculated Patient Ratio above and the CHD Risk Table to determine the patient's CHD Risk.        ATP III CLASSIFICATION (LDL):  <100     mg/dL   Optimal  100-129  mg/dL   Near or Above                    Optimal  130-159  mg/dL   Borderline  160-189  mg/dL   High  >190     mg/dL   Very High Performed at [facility] [address], [place], [place] [address]    HDL  Date Value Ref Range Status  04/29/2022 48 >40 mg/dL Final  01/15/2022 56 >39 mg/dL Final   Triglycerides  Date Value Ref Range Status  04/29/2022 86 <150 mg/dL Final         Passed - Patient is not pregnant      Passed - Valid encounter within last 12 months    Recent Outpatient Visits          2 months ago Essential hypertension   [place], Vernia Buff, NP   8 months ago Essential hypertension   [place], [place], [place]    1 year ago Primary hypertension   [place], Vernia Buff, NP   1 year ago Primary hypertension   [place], Vernia Buff, NP   1 year ago Essential hypertension   [place], Vernia Buff, NP      Future Appointments            In 2 weeks Gildardo Pounds, NP [place]           Signed Prescriptions Disp Refills   amLODipine (NORVASC) 10 MG tablet 90 tablet 0    Sig: TAKE 1 TABLET (10 MG TOTAL) BY MOUTH DAILY.  Cardiovascular: Calcium Channel Blockers 2 Passed - 09/30/2022  4:45 PM      Passed - Last BP in normal range    BP Readings from Last 1 Encounters:  07/16/22 126/78         Passed - Last Heart Rate in normal range    Pulse Readings from Last 1 Encounters:  07/16/22 74         Passed - Valid encounter within last 6 months    Recent Outpatient Visits          2 months ago Essential hypertension   [place], Vernia Buff, NP   8 months ago Essential hypertension   [place], Dionne Bucy, [place]   1 year ago Primary hypertension   [place], Vernia Buff, NP   1 year ago Primary hypertension   [place], Vernia Buff, NP   1 year ago Essential hypertension   [place], Vernia Buff, NP      Future Appointments            In 2 weeks Gildardo Pounds, NP [place]            aspirin EC 81 MG tablet 90 tablet 0    Sig: Take 1 tablet (81 mg total) by mouth daily. Swallow whole.     Analgesics:  NSAIDS - aspirin Passed - 09/30/2022  4:45 PM      Passed - Cr in normal range and within 360 days    Creat  Date Value Ref Range Status  11/04/2016 1.10 0.70 - 1.33 mg/dL Final    Comment:      For patients > or = 61  years of age: The upper reference limit for Creatinine is approximately 13% higher for people identified as African-American.      Creatinine, Ser  Date Value Ref Range Status  08/05/2022 1.17 0.76 - 1.27 mg/dL Final         Passed - eGFR is 10 or above and within 360 days    GFR calc Af Amer  Date Value Ref Range Status  11/27/2020 82 >59 mL/min/1.73 Final    Comment:    **In accordance with recommendations from the NKF-ASN Task force,**   [facility] is in the process of updating its eGFR calculation to the   2021 CKD-EPI creatinine equation that estimates kidney function   without a race variable.    GFR, Estimated  Date Value Ref Range Status  04/29/2022 >60 >60 mL/min Final    Comment:    (NOTE) Calculated using the CKD-EPI Creatinine Equation (2021)    eGFR  Date Value Ref Range Status  08/05/2022 71 >59 mL/min/1.73 Final         Passed - Patient is not pregnant      Passed - Valid encounter within last 12 months    Recent Outpatient Visits          2 months ago Essential hypertension   [place], Vernia Buff, NP   8 months ago Essential hypertension   [place], [place], [place]   1 year ago Primary hypertension   [place], Vernia Buff, NP   1 year ago Primary hypertension   [place],  Vernia Buff, NP   1 year ago Essential hypertension   [place], NP      Future Appointments            In 2 weeks Gildardo Pounds, NP [place]            losartan (COZAAR) 100 MG tablet 90 tablet 0    Sig: TAKE 1 TABLET (100 MG TOTAL) BY MOUTH DAILY.     Cardiovascular:  Angiotensin Receptor Blockers Passed - 09/30/2022  4:45 PM      Passed - Cr in normal range and within 180 days    Creat  Date Value Ref Range Status  11/04/2016 1.10  0.70 - 1.33 mg/dL Final    Comment:      For patients > or = 61 years of age: The upper reference limit for Creatinine is approximately 13% higher for people identified as African-American.      Creatinine, Ser  Date Value Ref Range Status  08/05/2022 1.17 0.76 - 1.27 mg/dL Final         Passed - K in normal range and within 180 days    Potassium  Date Value Ref Range Status  08/05/2022 4.2 3.5 - 5.2 mmol/L Final         Passed - Patient is not pregnant      Passed - Last BP in normal range    BP Readings from Last 1 Encounters:  07/16/22 126/78         Passed - Valid encounter within last 6 months    Recent Outpatient Visits          2 months ago Essential hypertension   [place], Vernia Buff, NP   8 months ago Essential hypertension   [place], [place], [place]   1 year ago Primary hypertension   [place], [place] W, NP   1 year ago Primary hypertension   [place], Vernia Buff, NP   1 year ago Essential hypertension   [place], Vernia Buff, NP      Future Appointments            In 2 weeks Gildardo Pounds, NP [place]            clopidogrel (PLAVIX) 75 MG tablet 90 tablet 0    Sig: Take 1 tablet (75 mg total) by mouth daily with breakfast.     Hematology: Antiplatelets - clopidogrel Failed - 09/30/2022  4:45 PM      Failed - HCT in normal range and within 180 days    HCT  Date Value Ref Range Status  04/29/2022 32.8 (L) 39.0 - 52.0 % Final   Hematocrit  Date Value Ref Range Status  04/15/2022 35.6 (L) 37.5 - 51.0 % Final         Failed - HGB in normal range and within 180 days    Hemoglobin  Date Value Ref Range Status  04/29/2022 11.4 (L) 13.0 - 17.0 g/dL Final  04/15/2022 12.3 (L) 13.0 - 17.7 g/dL Final         Passed -  PLT in normal range and within 180 days    Platelets  Date Value Ref Range Status  04/29/2022 245 150 - 400 K/uL Final  04/15/2022 274 150 - 450 x10E3/uL Final         Passed - Cr in normal range and within 360 days    Creat  Date Value Ref Range Status  11/04/2016 1.10 0.70 - 1.33 mg/dL Final    Comment:      For patients > or = 61 years of age: The upper reference limit for Creatinine is approximately 13% higher for people identified as African-American.      Creatinine, Ser  Date Value Ref Range Status  08/05/2022 1.17 0.76 - 1.27 mg/dL Final         Passed - Valid encounter within last 6 months    Recent Outpatient Visits          2 months ago Essential hypertension   [place], Vernia Buff, NP   8 months ago Essential hypertension   [place], Dionne Bucy, [place]   1 year ago Primary hypertension   [place], Vernia Buff, NP   1 year ago Primary hypertension   [place], Vernia Buff, NP   1 year ago Essential hypertension   [place], Vernia Buff, NP      Future Appointments            In 2 weeks Gildardo Pounds, NP [place]

## 2022-10-01 NOTE — Telephone Encounter (Signed)
Requested Prescriptions  Pending Prescriptions Disp Refills  . amLODipine (NORVASC) 10 MG tablet 90 tablet 0    Sig: TAKE 1 TABLET (10 MG TOTAL) BY MOUTH DAILY.     Cardiovascular: Calcium Channel Blockers 2 Passed - 09/30/2022  4:45 PM      Passed - Last BP in normal range    BP Readings from Last 1 Encounters:  07/16/22 126/78         Passed - Last Heart Rate in normal range    Pulse Readings from Last 1 Encounters:  07/16/22 74         Passed - Valid encounter within last 6 months    Recent Outpatient Visits          2 months ago Essential hypertension   [place], Vernia Buff, NP   8 months ago Essential hypertension   [place], Dionne Bucy, [place]   1 year ago Primary hypertension   [place], Vernia Buff, NP   1 year ago Primary hypertension   [place], Vernia Buff, NP   1 year ago Essential hypertension   [place], Vernia Buff, NP      Future Appointments            In 2 weeks Gildardo Pounds, NP [place]           . atorvastatin (LIPITOR) 20 MG tablet 90 tablet 3    Sig: TAKE 1 TABLET (20 MG TOTAL) BY MOUTH DAILY.     Cardiovascular:  Antilipid - Statins Failed - 09/30/2022  4:45 PM      Failed - Lipid Panel in normal range within the last 12 months    Cholesterol, Total  Date Value Ref Range Status  01/15/2022 173 100 - 199 mg/dL Final   Cholesterol  Date Value Ref Range Status  04/29/2022 157 0 - 200 mg/dL Final   LDL Chol Calc (NIH)  Date Value Ref Range Status  01/15/2022 102 (H) 0 - 99 mg/dL Final   LDL Cholesterol  Date Value Ref Range Status  04/29/2022 92 0 - 99 mg/dL Final    Comment:           Total Cholesterol/HDL:CHD Risk Coronary Heart Disease Risk Table                     Men   Women  1/2 Average Risk   3.4   3.3   Average Risk       5.0   4.4  2 X Average Risk   9.6   7.1  3 X Average Risk  23.4   11.0        Use the calculated Patient Ratio above and the CHD Risk Table to determine the patient's CHD Risk.        ATP III CLASSIFICATION (LDL):  <100     mg/dL   Optimal  100-129  mg/dL   Near or Above                    Optimal  130-159  mg/dL   Borderline  160-189  mg/dL   High  >190     mg/dL   Very High Performed at [facility] [address] [place], [place] [address]    HDL  Date  Value Ref Range Status  04/29/2022 48 >40 mg/dL Final  01/15/2022 56 >39 mg/dL Final   Triglycerides  Date Value Ref Range Status  04/29/2022 86 <150 mg/dL Final         Passed - Patient is not pregnant      Passed - Valid encounter within last 12 months    Recent Outpatient Visits          2 months ago Essential hypertension   [place], Vernia Buff, NP   8 months ago Essential hypertension   [place], [place], [place]   1 year ago Primary hypertension   [place], Vernia Buff, NP   1 year ago Primary hypertension   [place], Vernia Buff, NP   1 year ago Essential hypertension   [place], Vernia Buff, NP      Future Appointments            In 2 weeks Gildardo Pounds, NP [place]           . aspirin EC 81 MG tablet 90 tablet 0    Sig: Take 1 tablet (81 mg total) by mouth daily. Swallow whole.     Analgesics:  NSAIDS - aspirin Passed - 09/30/2022  4:45 PM      Passed - Cr in normal range and within 360 days    Creat  Date Value Ref Range Status  11/04/2016 1.10 0.70 - 1.33 mg/dL Final    Comment:      For patients > or = 61 years of age: The upper reference limit for Creatinine is approximately 13% higher for people identified as African-American.       Creatinine, Ser  Date Value Ref Range Status  08/05/2022 1.17 0.76 - 1.27 mg/dL Final         Passed - eGFR is 10 or above and within 360 days    GFR calc Af Amer  Date Value Ref Range Status  11/27/2020 82 >59 mL/min/1.73 Final    Comment:    **In accordance with recommendations from the NKF-ASN Task force,**   [facility] is in the process of updating its eGFR calculation to the   2021 CKD-EPI creatinine equation that estimates kidney function   without a race variable.    GFR, Estimated  Date Value Ref Range Status  04/29/2022 >60 >60 mL/min Final    Comment:    (NOTE) Calculated using the CKD-EPI Creatinine Equation (2021)    eGFR  Date Value Ref Range Status  08/05/2022 71 >59 mL/min/1.73 Final         Passed - Patient is not pregnant      Passed - Valid encounter within last 12 months    Recent Outpatient Visits          2 months ago Essential hypertension   [place], Vernia Buff, NP   8 months ago Essential hypertension   [place], [place], [place]   1 year ago Primary hypertension   [place], Vernia Buff, NP   1 year ago Primary hypertension   [place], Vernia Buff, NP   1 year ago Essential hypertension   [place], [place],  NP      Future Appointments            In 2 weeks Gildardo Pounds, NP [place]           . losartan (COZAAR) 100 MG tablet 90 tablet 0    Sig: TAKE 1 TABLET (100 MG TOTAL) BY MOUTH DAILY.     Cardiovascular:  Angiotensin Receptor Blockers Passed - 09/30/2022  4:45 PM      Passed - Cr in normal range and within 180 days    Creat  Date Value Ref Range Status  11/04/2016 1.10 0.70 - 1.33 mg/dL Final    Comment:      For patients > or = 61 years of age: The upper reference limit for Creatinine is  approximately 13% higher for people identified as African-American.      Creatinine, Ser  Date Value Ref Range Status  08/05/2022 1.17 0.76 - 1.27 mg/dL Final         Passed - K in normal range and within 180 days    Potassium  Date Value Ref Range Status  08/05/2022 4.2 3.5 - 5.2 mmol/L Final         Passed - Patient is not pregnant      Passed - Last BP in normal range    BP Readings from Last 1 Encounters:  07/16/22 126/78         Passed - Valid encounter within last 6 months    Recent Outpatient Visits          2 months ago Essential hypertension   [place], Vernia Buff, NP   8 months ago Essential hypertension   [place], [place], [place]   1 year ago Primary hypertension   [place], Vernia Buff, NP   1 year ago Primary hypertension   [place], Vernia Buff, NP   1 year ago Essential hypertension   [place], Vernia Buff, NP      Future Appointments            In 2 weeks Gildardo Pounds, NP [place]           . clopidogrel (PLAVIX) 75 MG tablet 90 tablet 0    Sig: Take 1 tablet (75 mg total) by mouth daily with breakfast.     Hematology: Antiplatelets - clopidogrel Failed - 09/30/2022  4:45 PM      Failed - HCT in normal range and within 180 days    HCT  Date Value Ref Range Status  04/29/2022 32.8 (L) 39.0 - 52.0 % Final   Hematocrit  Date Value Ref Range Status  04/15/2022 35.6 (L) 37.5 - 51.0 % Final         Failed - HGB in normal range and within 180 days    Hemoglobin  Date Value Ref Range Status  04/29/2022 11.4 (L) 13.0 - 17.0 g/dL Final  04/15/2022 12.3 (L) 13.0 - 17.7 g/dL Final         Passed - PLT in normal range and within 180 days    Platelets  Date Value Ref Range Status  04/29/2022 245 150 - 400 K/uL Final   04/15/2022 274 150 - 450 x10E3/uL Final         Passed - Cr in normal  range and within 360 days    Creat  Date Value Ref Range Status  11/04/2016 1.10 0.70 - 1.33 mg/dL Final    Comment:      For patients > or = 61 years of age: The upper reference limit for Creatinine is approximately 13% higher for people identified as African-American.      Creatinine, Ser  Date Value Ref Range Status  08/05/2022 1.17 0.76 - 1.27 mg/dL Final         Passed - Valid encounter within last 6 months    Recent Outpatient Visits          2 months ago Essential hypertension   [place], Vernia Buff, NP   8 months ago Essential hypertension   [place], Dionne Bucy, [place]   1 year ago Primary hypertension   [place], Vernia Buff, NP   1 year ago Primary hypertension   [place], Vernia Buff, NP   1 year ago Essential hypertension   [place], Vernia Buff, NP      Future Appointments            In 2 weeks Gildardo Pounds, NP [place]

## 2022-10-20 ENCOUNTER — Other Ambulatory Visit: Payer: Self-pay | Admitting: Pharmacist

## 2022-10-20 ENCOUNTER — Ambulatory Visit: Payer: Self-pay | Attending: Nurse Practitioner | Admitting: Nurse Practitioner

## 2022-10-20 ENCOUNTER — Encounter: Payer: Self-pay | Admitting: Nurse Practitioner

## 2022-10-20 VITALS — BP 164/80 | HR 80 | Temp 98.0°F | Ht 67.0 in | Wt 129.4 lb

## 2022-10-20 DIAGNOSIS — E782 Mixed hyperlipidemia: Secondary | ICD-10-CM

## 2022-10-20 DIAGNOSIS — Z125 Encounter for screening for malignant neoplasm of prostate: Secondary | ICD-10-CM

## 2022-10-20 DIAGNOSIS — J449 Chronic obstructive pulmonary disease, unspecified: Secondary | ICD-10-CM

## 2022-10-20 DIAGNOSIS — K219 Gastro-esophageal reflux disease without esophagitis: Secondary | ICD-10-CM

## 2022-10-20 DIAGNOSIS — D649 Anemia, unspecified: Secondary | ICD-10-CM

## 2022-10-20 DIAGNOSIS — I10 Essential (primary) hypertension: Secondary | ICD-10-CM

## 2022-10-20 DIAGNOSIS — Z23 Encounter for immunization: Secondary | ICD-10-CM

## 2022-10-20 DIAGNOSIS — F17200 Nicotine dependence, unspecified, uncomplicated: Secondary | ICD-10-CM

## 2022-10-20 DIAGNOSIS — M1990 Unspecified osteoarthritis, unspecified site: Secondary | ICD-10-CM

## 2022-10-20 MED ORDER — AMLODIPINE BESYLATE 10 MG PO TABS: ORAL_TABLET | Freq: Every day | ORAL | 0 refills | Status: DC

## 2022-10-20 MED ORDER — ATORVASTATIN CALCIUM 20 MG PO TABS: ORAL_TABLET | Freq: Every day | ORAL | 3 refills | Status: DC

## 2022-10-20 MED ORDER — ASPIRIN 81 MG PO TBEC: 81.0000 mg | DELAYED_RELEASE_TABLET | Freq: Every day | ORAL | 0 refills | Status: DC

## 2022-10-20 MED ORDER — FLUTICASONE PROPIONATE HFA 110 MCG/ACT IN AERO: 2.0000 | INHALATION_SPRAY | Freq: Every day | RESPIRATORY_TRACT | 6 refills | Status: DC

## 2022-10-20 MED ORDER — PANTOPRAZOLE SODIUM 40 MG PO TBEC: DELAYED_RELEASE_TABLET | Freq: Every day | ORAL | 2 refills | Status: DC

## 2022-10-20 MED ORDER — ALBUTEROL SULFATE HFA 108 (90 BASE) MCG/ACT IN AERS: 2.0000 | INHALATION_SPRAY | Freq: Four times a day (QID) | RESPIRATORY_TRACT | 2 refills | Status: DC | PRN

## 2022-10-20 MED ORDER — GABAPENTIN 300 MG PO CAPS
ORAL_CAPSULE | Freq: Three times a day (TID) | ORAL | 1 refills | Status: DC
  Filled 2022-11-10: qty 90, 30d supply, fill #0
  Filled 2022-12-29 (×2): qty 90, 30d supply, fill #1
  Filled 2023-02-13: qty 90, 30d supply, fill #2
  Filled 2023-04-02: qty 90, 30d supply, fill #3
  Filled 2023-05-11 – 2023-05-12 (×2): qty 90, 30d supply, fill #4
  Filled 2023-06-15: qty 90, 30d supply, fill #5

## 2022-10-20 MED ORDER — LOSARTAN POTASSIUM 100 MG PO TABS
ORAL_TABLET | Freq: Every day | ORAL | 0 refills | Status: DC
  Filled 2022-11-10: qty 90, 90d supply, fill #0

## 2022-10-20 NOTE — Progress Notes (Signed)
Appointment scheduled. Voicemail left with appointment information.

## 2022-10-20 NOTE — Progress Notes (Signed)
Assessment & Plan:  Ruben Fuentes was seen today for hypertension.  Diagnoses and all orders for this visit:  Primary hypertension -     CMP14+EGFR Continue all antihypertensives as prescribed.  Reminded to bring in blood pressure log for follow  up appointment.  RECOMMENDATIONS: DASH/Mediterranean Diets are healthier choices for HTN.    Anemia, unspecified type -     CBC with Differential -     Fecal occult blood, imunochemical([facility])  Tobacco dependence -     CT CHEST LUNG CA SCREEN LOW DOSE W/O CM; Future  Prostate cancer screening -     PSA  Need for shingles vaccine -     Varicella-zoster vaccine IM    Patient has been counseled on age-appropriate routine health concerns for screening and prevention. These are reviewed and up-to-date. Referrals have been placed accordingly. Immunizations are up-to-date or declined.    Subjective:   Chief Complaint  Patient presents with   Hypertension    No concerns.   Hypertension Pertinent negatives include no blurred vision, chest pain, headaches, malaise/fatigue, palpitations or shortness of breath.   Ruben Fuentes 61 y.o. male presents to office today for follow up to HTN. 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 Went from 1ppd for many years to 1/2 ppd for the past 5 years. Now smokes about 2 cigarettes per day.    HTN Blood pressure is not well controlled in office today however he does not normal readings at home with SBP 130s.  He is taking losartan 100 mg daily and amlodipine 10 mg daily as prescribed BP Readings from Last 3 Encounters:  10/20/22 (!) 164/80  07/16/22 126/78  05/13/22 (!) 114/58    Review of Systems  Constitutional:  Negative for fever,  malaise/fatigue and weight loss.  HENT: Negative.  Negative for nosebleeds.   Eyes: Negative.  Negative for blurred vision, double vision and photophobia.  Respiratory: Negative.  Negative for cough and shortness of breath.   Cardiovascular: Negative.  Negative for chest pain, palpitations and leg swelling.  Gastrointestinal: Negative.  Negative for heartburn, nausea and vomiting.  Musculoskeletal: Negative.  Negative for myalgias.  Neurological: Negative.  Negative for dizziness, focal weakness, seizures and headaches.  Psychiatric/Behavioral: Negative.  Negative for suicidal ideas.     Past Medical History:  Diagnosis Date   Arthritis    Bursitis    Hypertension     Past Surgical History:  Procedure Laterality Date   ABDOMINAL AORTOGRAM W/LOWER EXTREMITY N/A 04/28/2022   Procedure: ABDOMINAL AORTOGRAM W/LOWER EXTREMITY;  Surgeon: Lorretta Harp, MD;  Location: [facility];  Service: Cardiovascular;  Laterality: N/A;   FRACTURE SURGERY     collar bones   FRACTURE SURGERY     right ankle   PERIPHERAL VASCULAR INTERVENTION  04/28/2022   Procedure: PERIPHERAL VASCULAR INTERVENTION;  Surgeon: Lorretta Harp, MD;  Location: [facility];  Service: Cardiovascular;;    Family History  Problem Relation Age of Onset   Hypertension Mother    Hypertension Father    Rheum arthritis Sister    Diabetes Brother    Colon cancer Neg Hx    Esophageal cancer Neg Hx     Social History Reviewed with no changes to  be made today.   Outpatient Medications Prior to Visit  Medication Sig Dispense Refill   clopidogrel (PLAVIX) 75 MG tablet Take 1 tablet (75 mg total) by mouth daily with breakfast. 90 tablet 0   diclofenac Sodium (VOLTAREN) 1 % GEL APPLY 4 G TOPICALLY 4 (FOUR) TIMES DAILY. (Patient taking differently: Apply 4 g topically 4 (four) times daily as needed (pain).) 350 g 3   Multiple Vitamins-Minerals (MULTIVITAMIN WITH MINERALS) tablet Take 1 tablet by mouth daily.      albuterol (VENTOLIN HFA) 108 (90 Base) MCG/ACT inhaler Inhale 2 puffs into the lungs every 6 (six) hours as needed for wheezing or shortness of breath. 18 g 2   amLODipine (NORVASC) 10 MG tablet TAKE 1 TABLET (10 MG TOTAL) BY MOUTH DAILY. 90 tablet 0   aspirin EC 81 MG tablet Take 1 tablet (81 mg total) by mouth daily. Swallow whole. 90 tablet 0   atorvastatin (LIPITOR) 20 MG tablet TAKE 1 TABLET (20 MG TOTAL) BY MOUTH DAILY. 90 tablet 3   fluticasone (FLOVENT HFA) 110 MCG/ACT inhaler INHALE 2 PUFFS INTO THE LUNGS DAILY. 12 g 6   gabapentin (NEURONTIN) 300 MG capsule TAKE 1 CAPSULE (300 MG TOTAL) BY MOUTH 3 (THREE) TIMES DAILY. (Patient taking differently: Take 300 mg by mouth daily.) 270 capsule 1   losartan (COZAAR) 100 MG tablet TAKE 1 TABLET (100 MG TOTAL) BY MOUTH DAILY. 90 tablet 0   pantoprazole (PROTONIX) 40 MG tablet TAKE 1 TABLET (40 MG TOTAL) BY MOUTH DAILY. 30 tablet 2   No facility-administered medications prior to visit.    No Known Allergies     Objective:    BP (!) 164/80   Pulse 80   Temp 98 F (36.7 C) (Temporal)   Ht _0  (1.702 m)   Wt 129 lb 6.4 oz (58.7 kg)   SpO2 100%   BMI 20.27 kg/m  Wt Readings from Last 3 Encounters:  10/20/22 129 lb 6.4 oz (58.7 kg)  07/16/22 122 lb 3.2 oz (55.4 kg)  05/13/22 123 lb 12.8 oz (56.2 kg)    Physical Exam       Patient has been counseled extensively about nutrition and exercise as well as the importance of adherence with medications and regular follow-up. The patient was given clear instructions to go to ER or return to medical center if symptoms don't improve, worsen or new problems develop. The patient verbalized understanding.   Follow-up: Return in about 3 months (around 01/20/2023) for HTN; 2nd shingles vaccine .   Gildardo Pounds, FNP-BC [facility] [place], [place]   10/20/2022, 12:15 PM

## 2022-10-21 ENCOUNTER — Other Ambulatory Visit: Payer: Self-pay

## 2022-10-21 ENCOUNTER — Other Ambulatory Visit: Payer: Self-pay | Admitting: Nurse Practitioner

## 2022-10-21 ENCOUNTER — Telehealth: Payer: Self-pay | Admitting: Nurse Practitioner

## 2022-10-21 DIAGNOSIS — K5909 Other constipation: Secondary | ICD-10-CM

## 2022-10-21 DIAGNOSIS — J449 Chronic obstructive pulmonary disease, unspecified: Secondary | ICD-10-CM

## 2022-10-21 DIAGNOSIS — I10 Essential (primary) hypertension: Secondary | ICD-10-CM

## 2022-10-21 DIAGNOSIS — F17200 Nicotine dependence, unspecified, uncomplicated: Secondary | ICD-10-CM

## 2022-10-21 DIAGNOSIS — E782 Mixed hyperlipidemia: Secondary | ICD-10-CM

## 2022-10-21 LAB — CMP14+EGFR
ALT: 24 IU/L (ref 0–44)
AST: 29 IU/L (ref 0–40)
Albumin/Globulin Ratio: 1.6 (ref 1.2–2.2)
Albumin: 4.3 g/dL (ref 3.9–4.9)
Alkaline Phosphatase: 81 IU/L (ref 44–121)
BUN/Creatinine Ratio: 8 — ABNORMAL LOW (ref 10–24)
BUN: 8 mg/dL (ref 8–27)
Bilirubin Total: <0.2 mg/dL (ref 0.0–1.2)
CO2: 21 mmol/L (ref 20–29)
Calcium: 9 mg/dL (ref 8.6–10.2)
Chloride: 105 mmol/L (ref 96–106)
Creatinine, Ser: 1.04 mg/dL (ref 0.76–1.27)
Globulin, Total: 2.7 g/dL (ref 1.5–4.5)
Glucose: 75 mg/dL (ref 70–99)
Potassium: 4.2 mmol/L (ref 3.5–5.2)
Sodium: 139 mmol/L (ref 134–144)
Total Protein: 7 g/dL (ref 6.0–8.5)
eGFR: 82 mL/min/{1.73_m2} (ref >59)

## 2022-10-21 LAB — CBC WITH DIFFERENTIAL/PLATELET
Basophils Absolute: 0 10*3/uL (ref 0.0–0.2)
Basos: 1 %
EOS (ABSOLUTE): 0.1 10*3/uL (ref 0.0–0.4)
Eos: 1 %
Hematocrit: 25.1 % — ABNORMAL LOW (ref 37.5–51.0)
Hemoglobin: 7.2 g/dL — ABNORMAL LOW (ref 13.0–17.7)
Immature Grans (Abs): 0 10*3/uL (ref 0.0–0.1)
Immature Granulocytes: 0 %
Lymphocytes Absolute: 1.7 10*3/uL (ref 0.7–3.1)
Lymphs: 32 %
MCH: 19.1 pg — ABNORMAL LOW (ref 26.6–33.0)
MCHC: 28.7 g/dL — ABNORMAL LOW (ref 31.5–35.7)
MCV: 67 fL — ABNORMAL LOW (ref 79–97)
Monocytes Absolute: 1.1 10*3/uL — ABNORMAL HIGH (ref 0.1–0.9)
Monocytes: 20 %
Neutrophils Absolute: 2.6 10*3/uL (ref 1.4–7.0)
Neutrophils: 46 %
Platelets: 349 10*3/uL (ref 150–450)
RBC: 3.76 x10E6/uL — ABNORMAL LOW (ref 4.14–5.80)
RDW: 15.5 % — ABNORMAL HIGH (ref 11.6–15.4)
WBC: 5.5 10*3/uL (ref 3.4–10.8)

## 2022-10-21 LAB — PSA: Prostate Specific Ag, Serum: 3.4 ng/mL (ref 0.0–4.0)

## 2022-10-21 MED ORDER — ALBUTEROL SULFATE HFA 108 (90 BASE) MCG/ACT IN AERS
2.0000 | INHALATION_SPRAY | Freq: Four times a day (QID) | RESPIRATORY_TRACT | 2 refills | Status: DC | PRN
  Filled 2022-10-21: qty 6.7, 25d supply, fill #0
  Filled 2023-05-25: qty 6.7, 25d supply, fill #1

## 2022-10-21 MED ORDER — AMLODIPINE BESYLATE 10 MG PO TABS
ORAL_TABLET | Freq: Every day | ORAL | 0 refills | Status: DC
  Filled 2022-10-21: qty 30, 30d supply, fill #0

## 2022-10-21 MED ORDER — ASPIRIN 81 MG PO TBEC
81.0000 mg | DELAYED_RELEASE_TABLET | Freq: Every day | ORAL | 0 refills | Status: DC
  Filled 2022-10-21: qty 100, 100d supply, fill #0

## 2022-10-21 MED ORDER — ATORVASTATIN CALCIUM 20 MG PO TABS
ORAL_TABLET | Freq: Every day | ORAL | 3 refills | Status: DC
  Filled 2022-10-21: qty 90, 90d supply, fill #0

## 2022-10-21 MED ORDER — SENNOSIDES-DOCUSATE SODIUM 8.6-50 MG PO TABS
2.0000 | ORAL_TABLET | Freq: Every day | ORAL | 3 refills | Status: DC
  Filled 2022-10-21: qty 60, 30d supply, fill #0

## 2022-10-21 NOTE — Telephone Encounter (Signed)
Copied from CRM [id number]. Topic: General - Other >> Oct 21, 2022  2:45 PM [place] B wrote: Reason for CRM: Patients wife called in about patient was supposed ot be given a stool softner. Please call back

## 2022-10-21 NOTE — Telephone Encounter (Signed)
/  Medication Refill - Medication: albuterol (VENTOLIN HFA) 108 (90 Base) MCG/ACT inhale /amLODipine (NORVASC) 10 MG tablet aspirin EC 81 MG tablet /atorvastatin (LIPITOR) 20 MG tablet/carvedilo  Patient states med were sent to [facility] thaty patient doesn't use anymore, needs to be sent to [facility] pharmacy  Has the patient contacted their pharmacy? yes (Agent: If no, request that the patient contact the pharmacy for the refill. If patient does not wish to contact the pharmacy document the reason why and proceed with request.) (Agent: If yes, when and what did the pharmacy advise?)contact pcp  Preferred Pharmacy (with phone number or street name):  [facility] Phone: [phone]  Fax: [phone]     Has the patient been seen for an appointment in the last year OR does the patient have an upcoming appointment? yes  Agent: Please be advised that RX refills may take up to 3 business days. We ask that you follow-up with your pharmacy.

## 2022-10-21 NOTE — Telephone Encounter (Signed)
I sent the rxs to our pharmacy. I am unable to authorize a stool softener as there is not one on his profile. Will route to PCP to see if she can send one in.

## 2022-10-22 ENCOUNTER — Telehealth: Payer: Self-pay | Admitting: Nurse Practitioner

## 2022-10-22 ENCOUNTER — Other Ambulatory Visit: Payer: Self-pay

## 2022-10-22 NOTE — Telephone Encounter (Signed)
Pt's wife calling to see why Carvedilol wasn't sent in to pharmacy as well as other medications that were sent to [facility] needed to be sent to [facility] pharmacy. I advised her that I didn't see record of carvedilol being sent in and advised that 5 medications were sent yesterday. Reviewed AVS that she was talking about and made sure she had correct one. Wife finally found correct AVS and states that she apologized and would go pu other medications and no further assistance was needed

## 2022-10-22 NOTE — Telephone Encounter (Signed)
error 

## 2022-10-23 ENCOUNTER — Other Ambulatory Visit: Payer: Self-pay

## 2022-10-27 ENCOUNTER — Other Ambulatory Visit: Payer: Self-pay | Admitting: Nurse Practitioner

## 2022-10-27 ENCOUNTER — Other Ambulatory Visit: Payer: Self-pay

## 2022-10-27 ENCOUNTER — Ambulatory Visit: Payer: Self-pay | Attending: Nurse Practitioner

## 2022-10-27 DIAGNOSIS — F17200 Nicotine dependence, unspecified, uncomplicated: Secondary | ICD-10-CM

## 2022-10-27 DIAGNOSIS — D649 Anemia, unspecified: Secondary | ICD-10-CM

## 2022-10-28 ENCOUNTER — Other Ambulatory Visit: Payer: Self-pay

## 2022-10-28 ENCOUNTER — Other Ambulatory Visit (HOSPITAL_COMMUNITY): Payer: Self-pay

## 2022-10-28 LAB — CBC WITH DIFFERENTIAL/PLATELET
Basophils Absolute: 0 10*3/uL (ref 0.0–0.2)
Basos: 0 %
EOS (ABSOLUTE): 0 10*3/uL (ref 0.0–0.4)
Eos: 0 %
Hematocrit: 24 % — ABNORMAL LOW (ref 37.5–51.0)
Hemoglobin: 6.8 g/dL — CL (ref 13.0–17.7)
Immature Grans (Abs): 0 10*3/uL (ref 0.0–0.1)
Immature Granulocytes: 0 %
Lymphocytes Absolute: 2.2 10*3/uL (ref 0.7–3.1)
Lymphs: 33 %
MCH: 18.7 pg — ABNORMAL LOW (ref 26.6–33.0)
MCHC: 28.3 g/dL — ABNORMAL LOW (ref 31.5–35.7)
MCV: 66 fL — ABNORMAL LOW (ref 79–97)
Monocytes Absolute: 1.2 10*3/uL — ABNORMAL HIGH (ref 0.1–0.9)
Monocytes: 18 %
Neutrophils Absolute: 3.2 10*3/uL (ref 1.4–7.0)
Neutrophils: 49 %
Platelets: 285 10*3/uL (ref 150–450)
RBC: 3.63 x10E6/uL — ABNORMAL LOW (ref 4.14–5.80)
RDW: 15.6 % — ABNORMAL HIGH (ref 11.6–15.4)
WBC: 6.7 10*3/uL (ref 3.4–10.8)

## 2022-10-29 ENCOUNTER — Emergency Department (HOSPITAL_COMMUNITY)
Admission: EM | Admit: 2022-10-29 | Discharge: 2022-10-29 | Disposition: A | Discharge status: Home / Self Care | Payer: Self-pay | Attending: Emergency Medicine | Admitting: Emergency Medicine

## 2022-10-29 ENCOUNTER — Telehealth: Payer: Self-pay

## 2022-10-29 ENCOUNTER — Other Ambulatory Visit: Payer: Self-pay

## 2022-10-29 ENCOUNTER — Encounter (HOSPITAL_COMMUNITY): Payer: Self-pay | Admitting: Emergency Medicine

## 2022-10-29 DIAGNOSIS — I10 Essential (primary) hypertension: Secondary | ICD-10-CM | POA: Insufficient documentation

## 2022-10-29 DIAGNOSIS — F17200 Nicotine dependence, unspecified, uncomplicated: Secondary | ICD-10-CM | POA: Insufficient documentation

## 2022-10-29 DIAGNOSIS — D594 Other nonautoimmune hemolytic anemias: Secondary | ICD-10-CM | POA: Insufficient documentation

## 2022-10-29 DIAGNOSIS — Z7982 Long term (current) use of aspirin: Secondary | ICD-10-CM | POA: Insufficient documentation

## 2022-10-29 DIAGNOSIS — Z7902 Long term (current) use of antithrombotics/antiplatelets: Secondary | ICD-10-CM | POA: Insufficient documentation

## 2022-10-29 DIAGNOSIS — Z79899 Other long term (current) drug therapy: Secondary | ICD-10-CM | POA: Insufficient documentation

## 2022-10-29 DIAGNOSIS — D649 Anemia, unspecified: Secondary | ICD-10-CM

## 2022-10-29 LAB — COMPREHENSIVE METABOLIC PANEL
ALT: 22 U/L (ref 0–44)
AST: 27 U/L (ref 15–41)
Albumin: 3.8 g/dL (ref 3.5–5.0)
Alkaline Phosphatase: 68 U/L (ref 38–126)
Anion gap: 9 (ref 5–15)
BUN: 9 mg/dL (ref 8–23)
CO2: 23 mmol/L (ref 22–32)
Calcium: 8.8 mg/dL — ABNORMAL LOW (ref 8.9–10.3)
Chloride: 104 mmol/L (ref 98–111)
Creatinine, Ser: 1.14 mg/dL (ref 0.61–1.24)
GFR, Estimated: >60 mL/min (ref >60)
Glucose, Bld: 109 mg/dL — ABNORMAL HIGH (ref 70–99)
Potassium: 3.9 mmol/L (ref 3.5–5.1)
Sodium: 136 mmol/L (ref 135–145)
Total Bilirubin: 0.4 mg/dL (ref 0.3–1.2)
Total Protein: 7.1 g/dL (ref 6.5–8.1)

## 2022-10-29 LAB — CBC
HCT: 24.5 % — ABNORMAL LOW (ref 39.0–52.0)
Hemoglobin: 7 g/dL — ABNORMAL LOW (ref 13.0–17.0)
MCH: 18.8 pg — ABNORMAL LOW (ref 26.0–34.0)
MCHC: 28.6 g/dL — ABNORMAL LOW (ref 30.0–36.0)
MCV: 65.7 fL — ABNORMAL LOW (ref 80.0–100.0)
Platelets: 334 10*3/uL (ref 150–400)
RBC: 3.73 MIL/uL — ABNORMAL LOW (ref 4.22–5.81)
RDW: 17 % — ABNORMAL HIGH (ref 11.5–15.5)
WBC: 9.5 10*3/uL (ref 4.0–10.5)
nRBC: 0 % (ref 0.0–0.2)

## 2022-10-29 LAB — POC OCCULT BLOOD, ED: Fecal Occult Bld: NEGATIVE

## 2022-10-29 LAB — ABO/RH: ABO/RH(D): A POS

## 2022-10-29 LAB — FECAL OCCULT BLOOD, IMMUNOCHEMICAL: Fecal Occult Bld: NEGATIVE

## 2022-10-29 LAB — PREPARE RBC (CROSSMATCH)

## 2022-10-29 MED ORDER — FERROUS SULFATE 325 (65 FE) MG PO TABS
325.0000 mg | ORAL_TABLET | Freq: Once | ORAL | Status: AC
  Administered 2022-10-29: 325 mg via ORAL
  Filled 2022-10-29: qty 1

## 2022-10-29 MED ORDER — B COMPLEX VITAMINS PO CAPS
1.0000 | ORAL_CAPSULE | Freq: Every day | ORAL | 0 refills | Status: AC
  Filled 2022-10-29: qty 90, 90d supply, fill #0

## 2022-10-29 MED ORDER — FERROUS SULFATE 325 (65 FE) MG PO TABS
325.0000 mg | ORAL_TABLET | Freq: Every day | ORAL | 2 refills | Status: AC
  Filled 2022-10-29: qty 30, 30d supply, fill #0

## 2022-10-29 MED ORDER — SODIUM CHLORIDE 0.9 % IV SOLN
10.0000 mL/h | Freq: Once | INTRAVENOUS | Status: AC
  Administered 2022-10-29: 10 mL/h via INTRAVENOUS

## 2022-10-29 NOTE — ED Provider Notes (Signed)
[facility] EMERGENCY DEPARTMENT Provider Note   CSN: [id number] Arrival date & time: 10/29/22  1235     History  Chief Complaint  Patient presents with   Anemia    Ruben Fuentes is a 61 y.o. male.  With PMH of tobacco use, HTN, OA, PAD who presents with new anemia.  He had labs drawn outpatient 10/27/2022 with a hemoglobin of 6.8.  His doctor sent him here to figure out what was going on with him.  He has only been endorsing some mild fatigue otherwise denying any chest pain, shortness of breath, syncope, abdominal pain, GERD symptoms, hematemesis, hematuria, hematochezia, hemoptysis, melena.  He denies any bleeding whatsoever.  He is on Plavix.  No anticoagulation.  He has never had a blood transfusion in the past.  He notes no changes to his diet.   Anemia       Home Medications Prior to Admission medications   Medication Sig Start Date End Date Taking? Authorizing Provider  b complex vitamins capsule Take 1 capsule by mouth daily. 10/29/22  Yes Mardene Sayer, MD  ferrous sulfate 325 (65 FE) MG tablet Take 1 tablet (325 mg total) by mouth daily. 10/29/22  Yes Mardene Sayer, MD  albuterol (VENTOLIN HFA) 108 (90 Base) MCG/ACT inhaler Inhale 2 puffs into the lungs every 6 (six) hours as needed for wheezing or shortness of breath. 10/21/22   Hoy Register, MD  amLODipine (NORVASC) 10 MG tablet TAKE 1 TABLET (10 MG TOTAL) BY MOUTH DAILY. 10/21/22   Hoy Register, MD  aspirin EC (ASPIRIN ADULT LOW STRENGTH) 81 MG tablet Take 1 tablet (81 mg total) by mouth daily. 10/21/22   Hoy Register, MD  atorvastatin (LIPITOR) 20 MG tablet TAKE 1 TABLET (20 MG TOTAL) BY MOUTH DAILY. 10/21/22   Hoy Register, MD  clopidogrel (PLAVIX) 75 MG tablet Take 1 tablet (75 mg total) by mouth daily with breakfast. 10/01/22   Claiborne Rigg, NP  diclofenac Sodium (VOLTAREN) 1 % GEL APPLY 4 G TOPICALLY 4 (FOUR) TIMES DAILY. Patient taking differently: Apply 4 g  topically 4 (four) times daily as needed (pain). 01/15/22 01/15/23  Anders Simmonds, PA-C  fluticasone (FLOVENT HFA) 110 MCG/ACT inhaler INHALE 2 PUFFS INTO THE LUNGS DAILY. 10/20/22 10/20/23  Hoy Register, MD  gabapentin (NEURONTIN) 300 MG capsule TAKE 1 CAPSULE (300 MG TOTAL) BY MOUTH 3 (THREE) TIMES DAILY. 10/20/22 10/20/23  Hoy Register, MD  losartan (COZAAR) 100 MG tablet TAKE 1 TABLET (100 MG TOTAL) BY MOUTH DAILY. 10/20/22   Hoy Register, MD  Multiple Vitamins-Minerals (MULTIVITAMIN WITH MINERALS) tablet Take 1 tablet by mouth daily.    [provider]  pantoprazole (PROTONIX) 40 MG tablet TAKE 1 TABLET (40 MG TOTAL) BY MOUTH DAILY. 10/20/22   Hoy Register, MD  senna-docusate (SENOKOT-S) 8.6-50 MG tablet Take 2 tablets by mouth at bedtime. 10/21/22   Claiborne Rigg, NP      Allergies    Patient has no known allergies.    Review of Systems   Review of Systems  Physical Exam Updated Vital Signs BP 135/69   Pulse 72   Temp 98.1 F (36.7 C) (Oral)   Resp 14   Ht 5\' 7"  (1.702 m)   Wt 59 kg   SpO2 100%   BMI 20.36 kg/m  Physical Exam Constitutional: Alert and oriented.  Chronically ill in appearance but no acute distress and nontoxic Eyes: Conjunctivae are normal. ENT      Head: Normocephalic  and atraumatic.      Nose: No congestion.      Mouth/Throat: Mucous membranes are moist.      Neck: No stridor. Cardiovascular: S1, S2,  Normal and symmetric distal pulses are present in all extremities.Warm and well perfused. Respiratory: Normal respiratory effort.  O2 sat 100 on RA Gastrointestinal: Soft and nontender.  Rectal exam performed with bedside chaperone NT with no melena, no hematochezia  musculoskeletal: Normal range of motion in all extremities. Neurologic: Normal speech and language. No gross focal neurologic deficits are appreciated. Skin: Skin is warm, dry and intact. No rash noted. Psychiatric: Mood and affect are normal. Speech and behavior are  normal.  ED Results / Procedures / Treatments   Labs (all labs ordered are listed, but only abnormal results are displayed) Labs Reviewed  COMPREHENSIVE METABOLIC PANEL - Abnormal; Notable for the following components:      Result Value   Glucose, Bld 109 (*)    Calcium 8.8 (*)    All other components within normal limits  CBC - Abnormal; Notable for the following components:   RBC 3.73 (*)    Hemoglobin 7.0 (*)    HCT 24.5 (*)    MCV 65.7 (*)    MCH 18.8 (*)    MCHC 28.6 (*)    RDW 17.0 (*)    All other components within normal limits  IRON AND TIBC  POC OCCULT BLOOD, ED  TYPE AND SCREEN  ABO/RH  PREPARE RBC (CROSSMATCH)    EKG None  Radiology No results found.  Procedures .Critical Care  Performed by: Mardene Sayer, MD Authorized by: Mardene Sayer, MD   Critical care provider statement:    Critical care time (minutes):  30   Critical care was necessary to treat or prevent imminent or life-threatening deterioration of the following conditions: anemia requiring transfusion.   Critical care was time spent personally by me on the following activities:  Development of treatment plan with patient or surrogate, discussions with consultants, evaluation of patient's response to treatment, examination of patient, ordering and review of laboratory studies, ordering and review of radiographic studies, ordering and performing treatments and interventions, pulse oximetry, re-evaluation of patient's condition, review of old charts and obtaining history from patient or surrogate     Medications Ordered in ED Medications  0.9 %  sodium chloride infusion (0 mL/hr Intravenous Stopped 10/29/22 2018)  ferrous sulfate tablet 325 mg (325 mg Oral Given 10/29/22 1703)    ED Course/ Medical Decision Making/ A&P                           Medical Decision Making Ruben Fuentes is a 61 y.o. male.  With PMH of tobacco use, HTN, OA, PAD who presents with new anemia.   Patient  presents with new microcytic anemia hemoglobin 7 with MCV 65.7 from a baseline hemoglobin of approximately 12 which was 6 months prior.  Unclear etiology of patient's new anemia as he is denying any loss of blood and Hemoccult today was negative.  It is microcytic in nature which could suggest possible nutritional deficiencies such as iron deficiency anemia among other vitamin deficiencies.  Unlikely to be anemia of chronic disease since kidney function reassuring creatinine 1.14 with GFR greater than 60 and no known CKD.  Since patient is hemodynamically stable and only endorsing some mild fatigue, will give 1 unit PRBC blood transfusion.  Plan to start him on multivitamin supplement and  iron supplement.  He requested get further workup done outpatient as opposed to inpatient workup at this time.  Will also provide GI referral for possible endoscopy and colonoscopy although Hemoccult negative today.  Patient and patient's wife in agreement with this plan with strict return precautions discussed.  Amount and/or Complexity of Data Reviewed Labs: ordered.  Risk OTC drugs. Prescription drug management.    Final Clinical Impression(s) / ED Diagnoses Final diagnoses:  Symptomatic anemia    Rx / DC Orders ED Discharge Orders          Ordered    Ambulatory referral to Gastroenterology        10/29/22 2022    ferrous sulfate 325 (65 FE) MG tablet  Daily        10/29/22 2022    b complex vitamins capsule  Daily        10/29/22 2022              Mardene Sayer, MD 10/29/22 2023

## 2022-10-29 NOTE — ED Provider Triage Note (Signed)
Emergency Medicine Provider Triage Evaluation Note  Ruben Fuentes , a 61 y.o. male  was evaluated in triage.  Pt complains of anemia, per PCP.  Review of Systems  Positive: Anemia on labs Negative: No syncope  Physical Exam  BP (!) 148/57 (BP Location: Left Arm)   Pulse 81   Temp 98.4 F (36.9 C) (Oral)   Resp 16   SpO2 100%  Gen:   Awake, no distress  Resp:  Normal effort MSK:   Moves extremities without difficulty Other:  Skin wnl  Medical Decision Making  Medically screening exam initiated at 12:47 PM.  Appropriate orders placed.  JEN EPPINGER was informed that the remainder of the evaluation will be completed by another provider, this initial triage assessment does not replace that evaluation, and the importance of remaining in the ED until their evaluation is complete.   Gerhard Munch, MD 10/29/22 1249

## 2022-10-29 NOTE — Discharge Instructions (Addendum)
You were seen for anemia or low blood levels.  You were given a blood transfusion today.  Your rectal exam showed no evidence of internal bleeding.  It is important you follow-up with your primary care doctor to further determine the cause of your lobe blood levels.  It could be likely due to a iron deficiency or vitamin deficiency.  Start taking the iron supplements and the B complex vitamin capsules as prescribed.  Make an appointment with gastroenterology for further workup of your new anemia.  Come back if any severe chest pain, shortness of breath, fainting, large bloody stools, or any other symptoms concerning to you.

## 2022-10-29 NOTE — ED Triage Notes (Signed)
Patient states he was sent to ED by PCP for a low hemoglobin, 6.8 on lab drawn 11/20. Patient denies known bleeding, denies blood thinners, denies weakness and pain.

## 2022-10-29 NOTE — Telephone Encounter (Signed)
Pt and wife are at ED. Wife called stating pt is tired of waiting and if there is anything pt can do at home. Advised that pt is bleeding internally potentially and he needs to be worked up and not to go home. Advised what Hgb was 6 months ago versus now and wife is agreeable to stay at ED.

## 2022-10-30 LAB — TYPE AND SCREEN
ABO/RH(D): A POS
Antibody Screen: NEGATIVE
Unit division: 0

## 2022-10-30 LAB — BPAM RBC
Blood Product Expiration Date: 202312112359
ISSUE DATE / TIME: 202311221743
Unit Type and Rh: 6200

## 2022-10-31 ENCOUNTER — Other Ambulatory Visit: Payer: Self-pay

## 2022-11-07 ENCOUNTER — Other Ambulatory Visit: Payer: Self-pay | Admitting: Nurse Practitioner

## 2022-11-07 ENCOUNTER — Other Ambulatory Visit: Payer: Self-pay

## 2022-11-07 ENCOUNTER — Other Ambulatory Visit: Payer: Self-pay | Admitting: Family Medicine

## 2022-11-07 DIAGNOSIS — M1990 Unspecified osteoarthritis, unspecified site: Secondary | ICD-10-CM

## 2022-11-07 DIAGNOSIS — K219 Gastro-esophageal reflux disease without esophagitis: Secondary | ICD-10-CM

## 2022-11-07 NOTE — Telephone Encounter (Signed)
Attempted to call patient- has RF at different pharmacy- left message to call back. Rx 10/20/22 #270 1RF- too soon Requested Prescriptions  Pending Prescriptions Disp Refills   gabapentin (NEURONTIN) 300 MG capsule 270 capsule 1    Sig: TAKE 1 CAPSULE (300 MG TOTAL) BY MOUTH 3 (THREE) TIMES DAILY.     Neurology: Anticonvulsants - gabapentin Passed - 11/07/2022  3:22 PM      Passed - Cr in normal range and within 360 days    Creat  Date Value Ref Range Status  11/04/2016 1.10 0.70 - 1.33 mg/dL Final    Comment:      For patients > or = 61 years of age: The upper reference limit for Creatinine is approximately 13% higher for people identified as African-American.      Creatinine, Ser  Date Value Ref Range Status  10/29/2022 1.14 0.61 - 1.24 mg/dL Final         Passed - Completed PHQ-2 or PHQ-9 in the last 360 days      Passed - Valid encounter within last 12 months    Recent Outpatient Visits           2 weeks ago Primary hypertension   [facility] [place], Shea Stakes, NP   3 months ago Essential hypertension   [facility] [place], Shea Stakes, NP   9 months ago Essential hypertension   [facility] [place], [place], [place]   1 year ago Primary hypertension   [place] [facility] [address] [facility] [place], Shea Stakes, NP   1 year ago Primary hypertension    [address] [facility] [place], Shea Stakes, NP       Future Appointments             In 2 months Claiborne Rigg, NP [facility]

## 2022-11-07 NOTE — Telephone Encounter (Signed)
Attempted to call patient- RF requested from different pharmacy- need to know if switching pharmacy or RF actually needed.  Rx 10/20/22 #30 2RF Requested Prescriptions  Pending Prescriptions Disp Refills   pantoprazole (PROTONIX) 40 MG tablet 30 tablet 2    Sig: TAKE 1 TABLET (40 MG TOTAL) BY MOUTH DAILY.     Gastroenterology: Proton Pump Inhibitors Passed - 11/07/2022  3:21 PM      Passed - Valid encounter within last 12 months    Recent Outpatient Visits           2 weeks ago Primary hypertension   [facility] [place], Shea Stakes, NP   3 months ago Essential hypertension   [facility] [place], Shea Stakes, NP   9 months ago Essential hypertension   [facility] [place], [place]zella [place], [place]   1 year ago Primary hypertension   [facility] [address] [facility] [place], Shea Stakes, NP   1 year ago Primary hypertension   [place] [address] [facility] [place], Shea Stakes, NP       Future Appointments             In 2 months Claiborne Rigg, NP [facility]

## 2022-11-10 ENCOUNTER — Ambulatory Visit (HOSPITAL_BASED_OUTPATIENT_CLINIC_OR_DEPARTMENT_OTHER)
Admission: RE | Admit: 2022-11-10 | Discharge: 2022-11-10 | Disposition: A | Discharge status: Home / Self Care | Payer: Self-pay | Source: Ambulatory Visit | Attending: Cardiovascular Disease | Admitting: Cardiovascular Disease

## 2022-11-10 ENCOUNTER — Ambulatory Visit (HOSPITAL_COMMUNITY)
Admission: RE | Admit: 2022-11-10 | Discharge: 2022-11-10 | Disposition: A | Discharge status: Home / Self Care | Payer: Self-pay | Source: Ambulatory Visit | Attending: Cardiology | Admitting: Cardiology

## 2022-11-10 ENCOUNTER — Other Ambulatory Visit: Payer: Self-pay

## 2022-11-10 DIAGNOSIS — I739 Peripheral vascular disease, unspecified: Secondary | ICD-10-CM

## 2022-11-10 DIAGNOSIS — Z95828 Presence of other vascular implants and grafts: Secondary | ICD-10-CM

## 2022-11-20 ENCOUNTER — Other Ambulatory Visit: Payer: Self-pay

## 2022-11-24 ENCOUNTER — Other Ambulatory Visit: Payer: Self-pay

## 2022-11-25 ENCOUNTER — Ambulatory Visit
Admission: RE | Admit: 2022-11-25 | Discharge: 2022-11-25 | Disposition: A | Discharge status: Home / Self Care | Payer: No Typology Code available for payment source | Source: Ambulatory Visit | Attending: Nurse Practitioner | Admitting: Nurse Practitioner

## 2022-11-28 ENCOUNTER — Telehealth: Payer: Self-pay | Admitting: Licensed Clinical Social Worker

## 2022-11-28 NOTE — Telephone Encounter (Signed)
H&V Care Navigation CSW Progress Note  Clinical Social Worker contacted caregiver by phone (wife [name], [phone]) to f/u on call received after clinic on 12/21. Able to reach her this morning, she confirms pt has received flyer about Medicaid expansion and gone ahead and applied- he was confirmed she states and they are waiting on his card. I encouraged her to call me once he had his card and we can get all medications transferred to [facility]. I also shared that she didn't need to do anything regarding pt CAFA as they would utilize that for bills until pt Medicaid was processed and able to be used. No additional questions at this time.   Patient is participating in a Managed Medicaid Plan:  No, pending approval, currently has CAFA.   SDOH Screenings   Transportation Needs: No Transportation Needs (06/02/2022)  Depression (PHQ2-9): Low Risk  (10/20/2022)  Financial Resource Strain: Medium Risk (06/02/2022)  Tobacco Use: High Risk (10/29/2022)      Ruben Fuentes, MSW, LCSW Clinical Social Worker II [facility] Heart/Vascular Care Navigation  [phone]- work cell phone (preferred) [phone]- desk phone

## 2022-12-09 ENCOUNTER — Telehealth: Payer: Self-pay | Admitting: Licensed Clinical Social Worker

## 2022-12-09 NOTE — Telephone Encounter (Signed)
H&V Care Navigation CSW Progress Note  Clinical Social Worker  was contacted by pt caregiver  (pt wife) to let me know that they received a bill from [facility] ED provider. She states they went [facility] ED prior to holiday at request of PCP for transfusion for pt. I share that I think there may be some contracted providers that have contracts with our ED. But I will reach out to financial counseling Luther Redo when she is back in office tomorrow to better assess. If pt not eligible for assistance through [facility] may be eligible to apply for [facility] assistance program.   Patient is participating in a Managed Medicaid Plan:  Yes- [place] Medicaid.  SDOH Screenings   Transportation Needs: No Transportation Needs (06/02/2022)  Depression (PHQ2-9): Low Risk  (10/20/2022)  Financial Resource Strain: Medium Risk (06/02/2022)  Tobacco Use: High Risk (10/29/2022)     Westley Hummer, MSW, [place]  [phone]- work cell phone (preferred) [phone]- desk phone

## 2022-12-09 NOTE — Telephone Encounter (Addendum)
H&V Care Navigation CSW Progress Note  Clinical Social Worker contacted caregiver by phone to return call from pt wife, pt wife shares that he received his approval letter and card. He was approved for [facility] under number: [id number], we discussed calling [employer] if interested in any additional services and that he should bring card to all upcoming appts and pharmacy. Pt should call [facility] if receives any bills from before approval as he was approved for 100% [facility] discount for those bills if eligible. No additional questions at this time.   Patient is participating in a Managed Medicaid Plan:  Yes- [place] Medicaid  [place]   Transportation Needs: No Transportation Needs (06/02/2022)  Depression (PHQ2-9): Low Risk  (10/20/2022)  Financial Resource Strain: Medium Risk (06/02/2022)  Tobacco Use: High Risk (10/29/2022)     Westley Hummer, MSW, [place]  [phone]- work cell phone (preferred) [phone]- desk phone

## 2022-12-10 NOTE — Telephone Encounter (Signed)
H&V Care Navigation CSW Progress Note  Clinical Social Worker contacted caregiver by phone to f/u on issues with pt billing from [place] during [facility] ED visit. Per Janett Billow, financial counseling, pt can submit 100% [facility] CAFA to [facility] billing and the bill should be covered since services were in "relation to care at [facility]."   LCSW was unable to reach pt spouse Lelon Frohlich, left voicemail requesting call back. I can assist with securely sending letter of approval with permission to [place] billing. Will reattempt if she does not contact me back today.    Patient is participating in a Managed Medicaid Plan:  Yes-  Health[place] Medicaid  [place]   Transportation Needs: No Transportation Needs (06/02/2022)  Depression (PHQ2-9): Low Risk  (10/20/2022)  Financial Resource Strain: Medium Risk (06/02/2022)  Tobacco Use: High Risk (10/29/2022)    Westley Hummer, MSW, [place]  [phone]- work cell phone (preferred) [phone]- desk phone

## 2022-12-11 NOTE — Telephone Encounter (Signed)
H&V Care Navigation CSW Progress Note  Clinical Social Worker contacted caregiver by phone to update her with the following information. I was able to securely fax pt CAFA letter for assistance with [facility]  provider bill received from [facility] ED visit (Account [phone]). Per financial counseling on both ends this will be able to be rebilled. Faxed to [phone]. No answer from pt wife [phone], left voicemail. May be some delay in rebilling- I remain available as needed.  Patient is participating in a Managed Medicaid Plan:  Yes- [place] Medicaid  [place]   Transportation Needs: No Transportation Needs (06/02/2022)  Depression (PHQ2-9): Low Risk  (10/20/2022)  Financial Resource Strain: Medium Risk (06/02/2022)  Tobacco Use: High Risk (10/29/2022)   Westley Hummer, MSW, [place]  [phone]- work cell phone (preferred)

## 2022-12-29 ENCOUNTER — Other Ambulatory Visit: Payer: Self-pay

## 2022-12-29 NOTE — Telephone Encounter (Signed)
H&V Care Navigation CSW Progress Note  Clinical Social Worker  contacted by pt caregiver, Lelon Frohlich (DPR on file)  to f/u as she is still receiving bills for pt from [place]. I provided her with fax number we sent pt approval to- I also encouraged her to return bill with copy of the assistance letter.   Patient is participating in a Managed Medicaid Plan:  Yes- [place]   Transportation Needs: No Transportation Needs (06/02/2022)  Depression (PHQ2-9): Low Risk  (10/20/2022)  Financial Resource Strain: Medium Risk (06/02/2022)  Tobacco Use: High Risk (10/29/2022)    Westley Hummer, MSW, [place]  [phone]- work cell phone (preferred) [phone]- desk phone

## 2022-12-30 ENCOUNTER — Other Ambulatory Visit: Payer: Self-pay

## 2023-01-16 ENCOUNTER — Encounter: Payer: Self-pay | Admitting: Internal Medicine

## 2023-01-16 ENCOUNTER — Other Ambulatory Visit: Payer: Self-pay

## 2023-01-16 ENCOUNTER — Ambulatory Visit: Payer: Medicaid Other | Attending: Internal Medicine | Admitting: Internal Medicine

## 2023-01-16 VITALS — BP 128/68 | HR 80 | Ht 67.0 in | Wt 127.0 lb

## 2023-01-16 DIAGNOSIS — Z720 Tobacco use: Secondary | ICD-10-CM

## 2023-01-16 DIAGNOSIS — I739 Peripheral vascular disease, unspecified: Secondary | ICD-10-CM

## 2023-01-16 DIAGNOSIS — I771 Stricture of artery: Secondary | ICD-10-CM | POA: Insufficient documentation

## 2023-01-16 DIAGNOSIS — I10 Essential (primary) hypertension: Secondary | ICD-10-CM | POA: Diagnosis not present

## 2023-01-16 DIAGNOSIS — R0989 Other specified symptoms and signs involving the circulatory and respiratory systems: Secondary | ICD-10-CM

## 2023-01-16 NOTE — Patient Instructions (Signed)
Medication Instructions:  Your physician recommends that you continue on your current medications as directed. Please refer to the Current Medication list given to you today.  *If you need a refill on your cardiac medications before your next appointment, please call your pharmacy*   Lab Work: TODAY: FLP, ALT  If you have labs (blood work) drawn today and your tests are completely normal, you will receive your results only by: [place] (if you have MyChart) OR A paper copy in the mail If you have any lab test that is abnormal or we need to change your treatment, we will call you to review the results.   Testing/Procedures: JUNE 2024: Your physician has requested that you have a carotid duplex. This test is an ultrasound of the carotid arteries in your neck. It looks at blood flow through these arteries that supply the brain with blood. Allow one hour for this exam. There are no restrictions or special instructions.    Follow-Up: At [facility], you and your health needs are our priority.  As part of our continuing mission to provide you with exceptional heart care, we have created designated Provider Care Teams.  These Care Teams include your primary Cardiologist (physician) and Advanced Practice Providers (APPs -  Physician Assistants and Nurse Practitioners) who all work together to provide you with the care you need, when you need it.  We recommend signing up for the patient portal called "MyChart".  Sign up information is provided on this After Visit Summary.  MyChart is used to connect with patients for Virtual Visits (Telemedicine).  Patients are able to view lab/test results, encounter notes, upcoming appointments, etc.  Non-urgent messages can be sent to your provider as well.   To learn more about what you can do with MyChart, go to [email or web address].    Your next appointment:   1 year(s)  Provider:   Werner Lean, MD

## 2023-01-16 NOTE — Progress Notes (Signed)
Cardiology Office Note:    Date:  01/16/2023   ID:  Ruben Fuentes, DOB 1961/04/07, MRN [id number]  PCP:  Gildardo Pounds, NP  [facility] Cardiologist:  Rudean Haskell MD [place] Electrophysiologist:  None   CC: Follow up primary prevention CAD  History of Present Illness:    Ruben Fuentes is a 62 y.o. male with a hx of HTN, COPD, tobacco abuse (former) who presented wiith asymptomatic sinus bradycardia and GERD in 2021. 2022:  Patient has started PPI and stopped smoking, started back up intermittently. 2023: Worsening PAD- he had intervention with Dr. Gwenlyn Found 04/2022.  Patient notes that he is doing better.   He has cut back on smoking.  No chest pain or pressure .  No SOB/DOE and no PND/Orthopnea.  No weight gain or leg swelling.  No palpitations or syncope.   Past Medical History:  Diagnosis Date   Arthritis    Bursitis    Hypertension     Past Surgical History:  Procedure Laterality Date   ABDOMINAL AORTOGRAM W/LOWER EXTREMITY N/A 04/28/2022   Procedure: ABDOMINAL AORTOGRAM W/LOWER EXTREMITY;  Surgeon: Lorretta Harp, MD;  Location: [place] CV LAB;  Service: Cardiovascular;  Laterality: N/A;   FRACTURE SURGERY     collar bones   FRACTURE SURGERY     right ankle   PERIPHERAL VASCULAR INTERVENTION  04/28/2022   Procedure: PERIPHERAL VASCULAR INTERVENTION;  Surgeon: Lorretta Harp, MD;  Location: [place] CV LAB;  Service: Cardiovascular;;    Current Medications: Current Meds  Medication Sig   albuterol (VENTOLIN HFA) 108 (90 Base) MCG/ACT inhaler Inhale 2 puffs into the lungs every 6 (six) hours as needed for wheezing or shortness of breath.   amLODipine (NORVASC) 10 MG tablet TAKE 1 TABLET (10 MG TOTAL) BY MOUTH DAILY.   aspirin EC (ASPIRIN ADULT LOW STRENGTH) 81 MG tablet Take 1 tablet (81 mg total) by mouth daily.   atorvastatin (LIPITOR) 20 MG tablet TAKE 1 TABLET (20 MG TOTAL) BY MOUTH DAILY.   b complex vitamins capsule Take 1  capsule by mouth daily.   clopidogrel (PLAVIX) 75 MG tablet Take 1 tablet (75 mg total) by mouth daily with breakfast.   ferrous sulfate 325 (65 FE) MG tablet Take 1 tablet (325 mg total) by mouth daily.   fluticasone (FLOVENT HFA) 110 MCG/ACT inhaler INHALE 2 PUFFS INTO THE LUNGS DAILY.   gabapentin (NEURONTIN) 300 MG capsule TAKE 1 CAPSULE (300 MG TOTAL) BY MOUTH 3 (THREE) TIMES DAILY.   losartan (COZAAR) 100 MG tablet TAKE 1 TABLET (100 MG TOTAL) BY MOUTH DAILY.   Multiple Vitamins-Minerals (MULTIVITAMIN WITH MINERALS) tablet Take 1 tablet by mouth daily.   senna-docusate (SENOKOT-S) 8.6-50 MG tablet Take 2 tablets by mouth at bedtime.     Allergies:   Patient has no known allergies.   Social History   Socioeconomic History   Marital status: Married    Spouse name: Naphtali Mehner   Number of children: 2   Years of education: Not on file   Highest education level: Not on file  Occupational History   Not on file  Tobacco Use   Smoking status: Some Days    Packs/day: 0.30    Years: 30.00    Total pack years: 9.00    Types: Cigarettes   Smokeless tobacco: Never  Vaping Use   Vaping Use: Never used  Substance and Sexual Activity   Alcohol use: Yes    Alcohol/week: 2.0 standard drinks  of alcohol    Types: 2 Cans of beer per week    Comment: up to 5 beers a day   Drug use: Yes    Types: Marijuana    Comment: 2 cigs daily   Sexual activity: Not Currently  Other Topics Concern   Not on file  Social History Narrative   Not on file   Social Determinants of Health   Financial Resource Strain: Medium Risk (06/02/2022)   Overall Financial Resource Strain (CARDIA)    Difficulty of Paying Living Expenses: Somewhat hard  Food Insecurity: Not on file  Transportation Needs: No Transportation Needs (06/02/2022)   PRAPARE - Transportation    Lack of Transportation (Medical): No    Lack of Transportation (Non-Medical): No  Physical Activity: Not on file  Stress: Not on file   Social Connections: Not on file    Social:  Lelon Frohlich is his wife who is former Youth worker in [place] she os also Former boxer  Family History: The patient's family history includes Diabetes in his brother; Hypertension in his father and mother; Rheum arthritis in his sister. There is no history of Colon cancer or Esophageal cancer. History of coronary artery disease notable for father. History of heart failure notable for no members. History of arrhythmia notable for no members.  ROS:   Please see the history of present illness.    All other systems reviewed and are negative.  EKGs/Labs/Other Studies Reviewed:    The following studies were reviewed today:  EKG:   03/06/22: SR Rate 62 10/23/2020 sinus bradycardia rate 53 without evidence of high grade AV block 07/16/20:  Sinus bradycardia 49 PR 160     Recent Labs: 10/29/2022: ALT 22; BUN 9; Creatinine, Ser 1.14; Hemoglobin 7.0; Platelets 334; Potassium 3.9; Sodium 136  Recent Lipid Panel    Component Value Date/Time   CHOL 157 04/29/2022 0319   CHOL 173 01/15/2022 1051   TRIG 86 04/29/2022 0319   HDL 48 04/29/2022 0319   HDL 56 01/15/2022 1051   CHOLHDL 3.3 04/29/2022 0319   VLDL 17 04/29/2022 0319   LDLCALC 92 04/29/2022 0319   LDLCALC 102 (H) 01/15/2022 1051     Physical Exam:    VS:  BP 128/68   Pulse 80   Ht 5' 7"$  (1.702 m)   Wt 127 lb (57.6 kg)   BMI 19.89 kg/m     Wt Readings from Last 3 Encounters:  01/16/23 127 lb (57.6 kg)  10/29/22 130 lb (59 kg)  10/20/22 129 lb 6.4 oz (58.7 kg)    Gen: no distress  Neck: No JVD; left carotid bruit new from prior Cardiac: No Rubs or Gallops, no murmur, RRR +2 radial pulses Respiratory: Clear to auscultation bilaterally, normal effort, normal  respiratory rate GI: Soft, nontender, non-distended  MS: No  edema;  moves all extremities Integument: Skin feels warm Neuro:  At time of evaluation, alert and oriented to person/place/time/situation  Psych: Normal affect,  patient feels well  ASSESSMENT:    1. Essential hypertension   2. PAD (peripheral artery disease) ([place])   3. Tobacco abuse   4. Stenosis of left subclavian artery (HCC)   5. Right carotid bruit     PLAN:     HTN CKD Stage IIIa - Controlled on norvasc 10 mg, losartan 100 mg  HLD - lipids today, likely increase atorvastatin to 40 mg  Tobacco abuse - down to 1-2 a day - quit date is this Saturday  Left carotid bruit  With left subclavian stenosis 2023 - will get Duplex in June 2024 with results to me and Dr. Gwenlyn Found  PAD - Principally managed by Dr. Gwenlyn Found (see notes) - he is on DAPT   One year with me    Medication Adjustments/Labs and Tests Ordered: Current medicines are reviewed at length with the patient today.  Concerns regarding medicines are outlined above.  Orders Placed This Encounter  Procedures   Lipid panel   ALT   VAS US CAROTID   No orders of the defined types were placed in this encounter.   Patient Instructions  Medication Instructions:  Your physician recommends that you continue on your current medications as directed. Please refer to the Current Medication list given to you today.  *If you need a refill on your cardiac medications before your next appointment, please call your pharmacy*   Lab Work: TODAY: FLP, ALT  If you have labs (blood work) drawn today and your tests are completely normal, you will receive your results only by: [place] (if you have MyChart) OR A paper copy in the mail If you have any lab test that is abnormal or we need to change your treatment, we will call you to review the results.   Testing/Procedures: JUNE 2024: Your physician has requested that you have a carotid duplex. This test is an ultrasound of the carotid arteries in your neck. It looks at blood flow through these arteries that supply the brain with blood. Allow one hour for this exam. There are no restrictions or special instructions.     Follow-Up: At [facility], you and your health needs are our priority.  As part of our continuing mission to provide you with exceptional heart care, we have created designated Provider Care Teams.  These Care Teams include your primary Cardiologist (physician) and Advanced Practice Providers (APPs -  Physician Assistants and Nurse Practitioners) who all work together to provide you with the care you need, when you need it.  We recommend signing up for the patient portal called "MyChart".  Sign up information is provided on this After Visit Summary.  MyChart is used to connect with patients for Virtual Visits (Telemedicine).  Patients are able to view lab/test results, encounter notes, upcoming appointments, etc.  Non-urgent messages can be sent to your provider as well.   To learn more about what you can do with MyChart, go to [email or web address].    Your next appointment:   1 year(s)  Provider:   Werner Lean, MD       Signed, Werner Lean, MD  01/16/2023 9:56 AM    [place]

## 2023-01-17 LAB — LIPID PANEL
Chol/HDL Ratio: 2.7 ratio (ref 0.0–5.0)
Cholesterol, Total: 150 mg/dL (ref 100–199)
HDL: 55 mg/dL (ref >39)
LDL Chol Calc (NIH): 80 mg/dL (ref 0–99)
Triglycerides: 77 mg/dL (ref 0–149)
VLDL Cholesterol Cal: 15 mg/dL (ref 5–40)

## 2023-01-17 LAB — ALT: ALT: 31 IU/L (ref 0–44)

## 2023-01-19 ENCOUNTER — Other Ambulatory Visit: Payer: Self-pay

## 2023-01-19 ENCOUNTER — Telehealth: Payer: Self-pay | Admitting: Internal Medicine

## 2023-01-19 DIAGNOSIS — E782 Mixed hyperlipidemia: Secondary | ICD-10-CM

## 2023-01-19 DIAGNOSIS — I739 Peripheral vascular disease, unspecified: Secondary | ICD-10-CM

## 2023-01-19 MED ORDER — ATORVASTATIN CALCIUM 40 MG PO TABS
40.0000 mg | ORAL_TABLET | Freq: Every day | ORAL | 3 refills | Status: DC
  Filled 2023-01-19: qty 90, 90d supply, fill #0
  Filled 2023-04-30 – 2023-05-01 (×2): qty 90, 90d supply, fill #1
  Filled 2023-08-02: qty 90, 90d supply, fill #2
  Filled 2023-11-02: qty 90, 90d supply, fill #3

## 2023-01-19 NOTE — Telephone Encounter (Signed)
Patient is returning RN's call for lab results. Please advise.

## 2023-01-19 NOTE — Telephone Encounter (Signed)
-----   Message from Werner Lean, MD sent at 01/18/2023  2:25 PM EST ----- Results: LDL above goal Plan: Offer doubling of statin and f/u labs in three months  Werner Lean, MD

## 2023-01-19 NOTE — Telephone Encounter (Signed)
The patient has been notified of the result and verbalized understanding.  All questions (if any) were answered. Precious Gilding, RN 01/19/2023 5:48 PM   Pt will have FLP, ALT on 04/13/23. Appointment reminder sent out.

## 2023-01-20 ENCOUNTER — Ambulatory Visit: Payer: Medicaid Other | Attending: Nurse Practitioner | Admitting: Nurse Practitioner

## 2023-01-20 ENCOUNTER — Other Ambulatory Visit: Payer: Self-pay

## 2023-01-20 ENCOUNTER — Encounter: Payer: Self-pay | Admitting: Nurse Practitioner

## 2023-01-20 VITALS — BP 108/68 | HR 74 | Ht 67.0 in | Wt 129.6 lb

## 2023-01-20 DIAGNOSIS — Z23 Encounter for immunization: Secondary | ICD-10-CM

## 2023-01-20 DIAGNOSIS — I10 Essential (primary) hypertension: Secondary | ICD-10-CM

## 2023-01-20 DIAGNOSIS — K219 Gastro-esophageal reflux disease without esophagitis: Secondary | ICD-10-CM | POA: Diagnosis not present

## 2023-01-20 DIAGNOSIS — I739 Peripheral vascular disease, unspecified: Secondary | ICD-10-CM

## 2023-01-20 DIAGNOSIS — D649 Anemia, unspecified: Secondary | ICD-10-CM

## 2023-01-20 MED ORDER — LOSARTAN POTASSIUM 100 MG PO TABS
100.0000 mg | ORAL_TABLET | Freq: Every day | ORAL | 1 refills | Status: DC
  Filled 2023-01-20 – 2023-03-20 (×3): qty 90, 90d supply, fill #0

## 2023-01-20 MED ORDER — AMLODIPINE BESYLATE 10 MG PO TABS
10.0000 mg | ORAL_TABLET | Freq: Every day | ORAL | 1 refills | Status: DC
  Filled 2023-01-20: qty 90, 90d supply, fill #0
  Filled 2023-05-19: qty 90, 90d supply, fill #1

## 2023-01-20 MED ORDER — CLOPIDOGREL BISULFATE 75 MG PO TABS
75.0000 mg | ORAL_TABLET | Freq: Every day | ORAL | 1 refills | Status: DC
  Filled 2023-01-20: qty 90, 90d supply, fill #0
  Filled 2023-05-19: qty 90, 90d supply, fill #1

## 2023-01-20 MED ORDER — PANTOPRAZOLE SODIUM 40 MG PO TBEC
40.0000 mg | DELAYED_RELEASE_TABLET | Freq: Every day | ORAL | 2 refills | Status: DC
  Filled 2023-01-20: qty 90, 90d supply, fill #0
  Filled 2024-01-06: qty 90, 90d supply, fill #1

## 2023-01-20 NOTE — Progress Notes (Signed)
Assessment & Plan:  Ruben Fuentes was seen today for hypertension.  Diagnoses and all orders for this visit:  Essential hypertension -     amLODipine (NORVASC) 10 MG tablet; Take 1 tablet (10 mg total) by mouth daily. -     losartan (COZAAR) 100 MG tablet; Take 1 tablet (100 mg total) by mouth daily. -     Basic metabolic panel  Need for shingles vaccine -     Varicella-zoster vaccine IM  Gastroesophageal reflux disease without esophagitis -     pantoprazole (PROTONIX) 40 MG tablet; Take 1 tablet (40 mg total) by mouth daily.  PAD (peripheral artery disease) (HCC) -     clopidogrel (PLAVIX) 75 MG tablet; Take 1 tablet (75 mg total) by mouth daily with breakfast.  Anemia, unspecified type -     CBC with Differential    Patient has been counseled on age-appropriate routine health concerns for screening and prevention. These are reviewed and up-to-date. Referrals have been placed accordingly. Immunizations are up-to-date or declined.    Subjective:   Chief Complaint  Patient presents with   Hypertension   HPI Ruben Fuentes 62 y.o. male presents to office today for follow up to HTN He is accompanied by his wife today.   PMH: COPD, HTN, HPL, sinus bradycardia, GERD, claudication, S/P peripheral angiography with iliac artery stenting 04-2022/on plavix), PVD, tobacco abuse (cutting back and down to 2 cigarettes a day)    HTN Blood pressure is well controlled in office today.  He is taking losartan 100 mg daily and amlodipine 10 mg daily as prescribed. BP Readings from Last 3 Encounters:  01/20/23 108/68  01/16/23 128/68  10/29/22 135/69     Review of Systems  Constitutional:  Negative for fever, malaise/fatigue and weight loss.  HENT: Negative.  Negative for nosebleeds.   Eyes: Negative.  Negative for blurred vision, double vision and photophobia.  Respiratory: Negative.  Negative for cough and shortness of breath.   Cardiovascular: Negative.  Negative for chest pain,  palpitations and leg swelling.  Gastrointestinal: Negative.  Negative for heartburn, nausea and vomiting.  Musculoskeletal: Negative.  Negative for myalgias.  Neurological: Negative.  Negative for dizziness, focal weakness, seizures and headaches.  Psychiatric/Behavioral: Negative.  Negative for suicidal ideas.     Past Medical History:  Diagnosis Date   Arthritis    Bursitis    Hypertension     Past Surgical History:  Procedure Laterality Date   ABDOMINAL AORTOGRAM W/LOWER EXTREMITY N/A 04/28/2022   Procedure: ABDOMINAL AORTOGRAM W/LOWER EXTREMITY;  Surgeon: Lorretta Harp, MD;  Location: [facility];  Service: Cardiovascular;  Laterality: N/A;   FRACTURE SURGERY     collar bones   FRACTURE SURGERY     right ankle   PERIPHERAL VASCULAR INTERVENTION  04/28/2022   Procedure: PERIPHERAL VASCULAR INTERVENTION;  Surgeon: Lorretta Harp, MD;  Location: [facility];  Service: Cardiovascular;;    Family History  Problem Relation Age of Onset   Hypertension Mother    Hypertension Father    Rheum arthritis Sister    Diabetes Brother    Colon cancer Neg Hx    Esophageal cancer Neg Hx     Social History Reviewed with no changes to be made today.   Outpatient Medications Prior to Visit  Medication Sig Dispense Refill   albuterol (VENTOLIN HFA) 108 (90 Base) MCG/ACT inhaler Inhale 2 puffs into the lungs every 6 (six) hours as needed for wheezing or shortness of breath.  6.7 g 2   aspirin EC (ASPIRIN ADULT LOW STRENGTH) 81 MG tablet Take 1 tablet (81 mg total) by mouth daily. 100 tablet 0   atorvastatin (LIPITOR) 40 MG tablet Take 1 tablet (40 mg total) by mouth daily. 90 tablet 3   b complex vitamins capsule Take 1 capsule by mouth daily. 90 capsule 0   ferrous sulfate 325 (65 FE) MG tablet Take 1 tablet (325 mg total) by mouth daily. 30 tablet 2   fluticasone (FLOVENT HFA) 110 MCG/ACT inhaler INHALE 2 PUFFS INTO THE LUNGS DAILY. 12 g 6   gabapentin (NEURONTIN) 300  MG capsule TAKE 1 CAPSULE (300 MG TOTAL) BY MOUTH 3 (THREE) TIMES DAILY. 270 capsule 1   Multiple Vitamins-Minerals (MULTIVITAMIN WITH MINERALS) tablet Take 1 tablet by mouth daily.     senna-docusate (SENOKOT-S) 8.6-50 MG tablet Take 2 tablets by mouth at bedtime. 60 tablet 3   amLODipine (NORVASC) 10 MG tablet TAKE 1 TABLET (10 MG TOTAL) BY MOUTH DAILY. 90 tablet 0   clopidogrel (PLAVIX) 75 MG tablet Take 1 tablet (75 mg total) by mouth daily with breakfast. 90 tablet 0   losartan (COZAAR) 100 MG tablet TAKE 1 TABLET (100 MG TOTAL) BY MOUTH DAILY. 90 tablet 0   pantoprazole (PROTONIX) 40 MG tablet TAKE 1 TABLET (40 MG TOTAL) BY MOUTH DAILY. 30 tablet 2   No facility-administered medications prior to visit.    No Known Allergies     Objective:    BP 108/68   Pulse 74   Ht 5' 7"$  (1.702 m)   Wt 129 lb 9.6 oz (58.8 kg)   SpO2 98%   BMI 20.30 kg/m  Wt Readings from Last 3 Encounters:  01/20/23 129 lb 9.6 oz (58.8 kg)  01/16/23 127 lb (57.6 kg)  10/29/22 130 lb (59 kg)    Physical Exam Vitals and nursing note reviewed.  Constitutional:      Appearance: He is well-developed.  HENT:     Head: Normocephalic and atraumatic.  Cardiovascular:     Rate and Rhythm: Normal rate and regular rhythm.     Heart sounds: Normal heart sounds. No murmur heard.    No friction rub. No gallop.  Pulmonary:     Effort: Pulmonary effort is normal. No tachypnea or respiratory distress.     Breath sounds: Normal breath sounds. No decreased breath sounds, wheezing, rhonchi or rales.  Chest:     Chest wall: No tenderness.  Abdominal:     General: Bowel sounds are normal.     Palpations: Abdomen is soft.  Musculoskeletal:        General: Normal range of motion.     Cervical back: Normal range of motion.  Skin:    General: Skin is warm and dry.  Neurological:     Mental Status: He is alert and oriented to person, place, and time.     Coordination: Coordination normal.  Psychiatric:         Behavior: Behavior normal. Behavior is cooperative.        Thought Content: Thought content normal.        Judgment: Judgment normal.          Patient has been counseled extensively about nutrition and exercise as well as the importance of adherence with medications and regular follow-up. The patient was given clear instructions to go to ER or return to medical center if symptoms don't improve, worsen or new problems develop. The patient verbalized understanding.   Follow-up: Return  in about 4 months (around 05/21/2023) for physical.   Gildardo Pounds, FNP-BC [facility] [place], [place]   01/20/2023, 9:29 AM

## 2023-01-21 LAB — CBC WITH DIFFERENTIAL/PLATELET

## 2023-01-22 LAB — CBC WITH DIFFERENTIAL/PLATELET
Basophils Absolute: 0 10*3/uL (ref 0.0–0.2)
Basos: 1 %
EOS (ABSOLUTE): 0 10*3/uL (ref 0.0–0.4)
Eos: 1 %
Hematocrit: 39.9 % (ref 37.5–51.0)
Hemoglobin: 12.4 g/dL — ABNORMAL LOW (ref 13.0–17.7)
Immature Grans (Abs): 0 10*3/uL (ref 0.0–0.1)
Immature Granulocytes: 0 %
Lymphocytes Absolute: 1.8 10*3/uL (ref 0.7–3.1)
Lymphs: 30 %
MCH: 27.3 pg (ref 26.6–33.0)
MCHC: 31.1 g/dL — ABNORMAL LOW (ref 31.5–35.7)
MCV: 88 fL (ref 79–97)
Monocytes Absolute: 0.9 10*3/uL (ref 0.1–0.9)
Monocytes: 15 %
Neutrophils Absolute: 3.3 10*3/uL (ref 1.4–7.0)
Neutrophils: 53 %
Platelets: 274 10*3/uL (ref 150–450)
RBC: 4.55 x10E6/uL (ref 4.14–5.80)
WBC: 6.1 10*3/uL (ref 3.4–10.8)

## 2023-01-22 LAB — BASIC METABOLIC PANEL
BUN/Creatinine Ratio: 10 (ref 10–24)
BUN: 11 mg/dL (ref 8–27)
CO2: 21 mmol/L (ref 20–29)
Calcium: 9.3 mg/dL (ref 8.6–10.2)
Chloride: 100 mmol/L (ref 96–106)
Creatinine, Ser: 1.09 mg/dL (ref 0.76–1.27)
Glucose: 103 mg/dL — ABNORMAL HIGH (ref 70–99)
Potassium: 4.3 mmol/L (ref 3.5–5.2)
Sodium: 138 mmol/L (ref 134–144)
eGFR: 77 mL/min/{1.73_m2} (ref >59)

## 2023-01-23 ENCOUNTER — Other Ambulatory Visit: Payer: Self-pay

## 2023-02-13 ENCOUNTER — Other Ambulatory Visit: Payer: Self-pay

## 2023-02-16 ENCOUNTER — Other Ambulatory Visit: Payer: Self-pay

## 2023-02-17 ENCOUNTER — Other Ambulatory Visit: Payer: Self-pay

## 2023-02-27 ENCOUNTER — Other Ambulatory Visit: Payer: Self-pay

## 2023-03-19 ENCOUNTER — Other Ambulatory Visit: Payer: Self-pay

## 2023-03-20 ENCOUNTER — Other Ambulatory Visit: Payer: Self-pay

## 2023-03-30 ENCOUNTER — Telehealth: Payer: Self-pay | Admitting: Licensed Clinical Social Worker

## 2023-03-30 NOTE — Telephone Encounter (Signed)
H&V Care Navigation CSW Progress Note  Clinical Social Worker  received a call from pt wife  to again request a copy of his [employer] letter for bills she keeps getting from previous hospital stay. I have mailed this again to their home address. No additional questions/concerns otherwise doing well.   Patient is participating in a Managed Medicaid Plan:  Yes- [employer] Medicaid  SDOH Screenings   Transportation Needs: No Transportation Needs (06/02/2022)  Depression (PHQ2-9): Low Risk  (01/20/2023)  Financial Resource Strain: Medium Risk (06/02/2022)  Tobacco Use: Medium Risk (01/20/2023)   Octavio Graves, MSW, LCSW Clinical Social Worker II [facility] Care Navigation  [phone]- work cell phone (preferred) [phone]- desk phone

## 2023-04-02 ENCOUNTER — Other Ambulatory Visit: Payer: Self-pay

## 2023-04-02 IMAGING — CR DG ELBOW COMPLETE 3+V*L*
4 series · 4 of 4 positions shown · non-contrast
Comparison: None.

CLINICAL DATA: Chronic elbow pain

EXAM:
LEFT ELBOW - COMPLETE 3+ VIEW

[elbow ap]
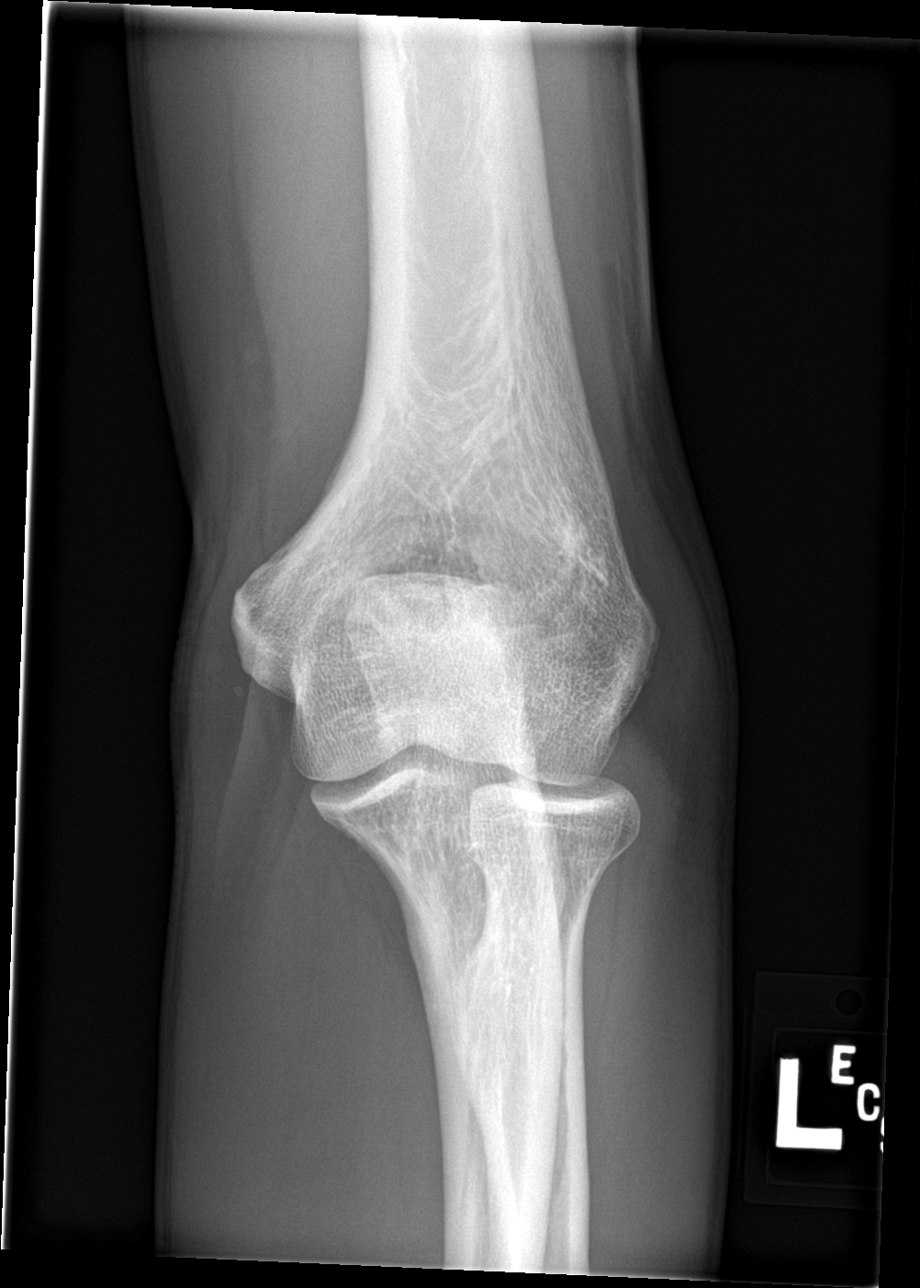

[elbow obl (1 of 2)]
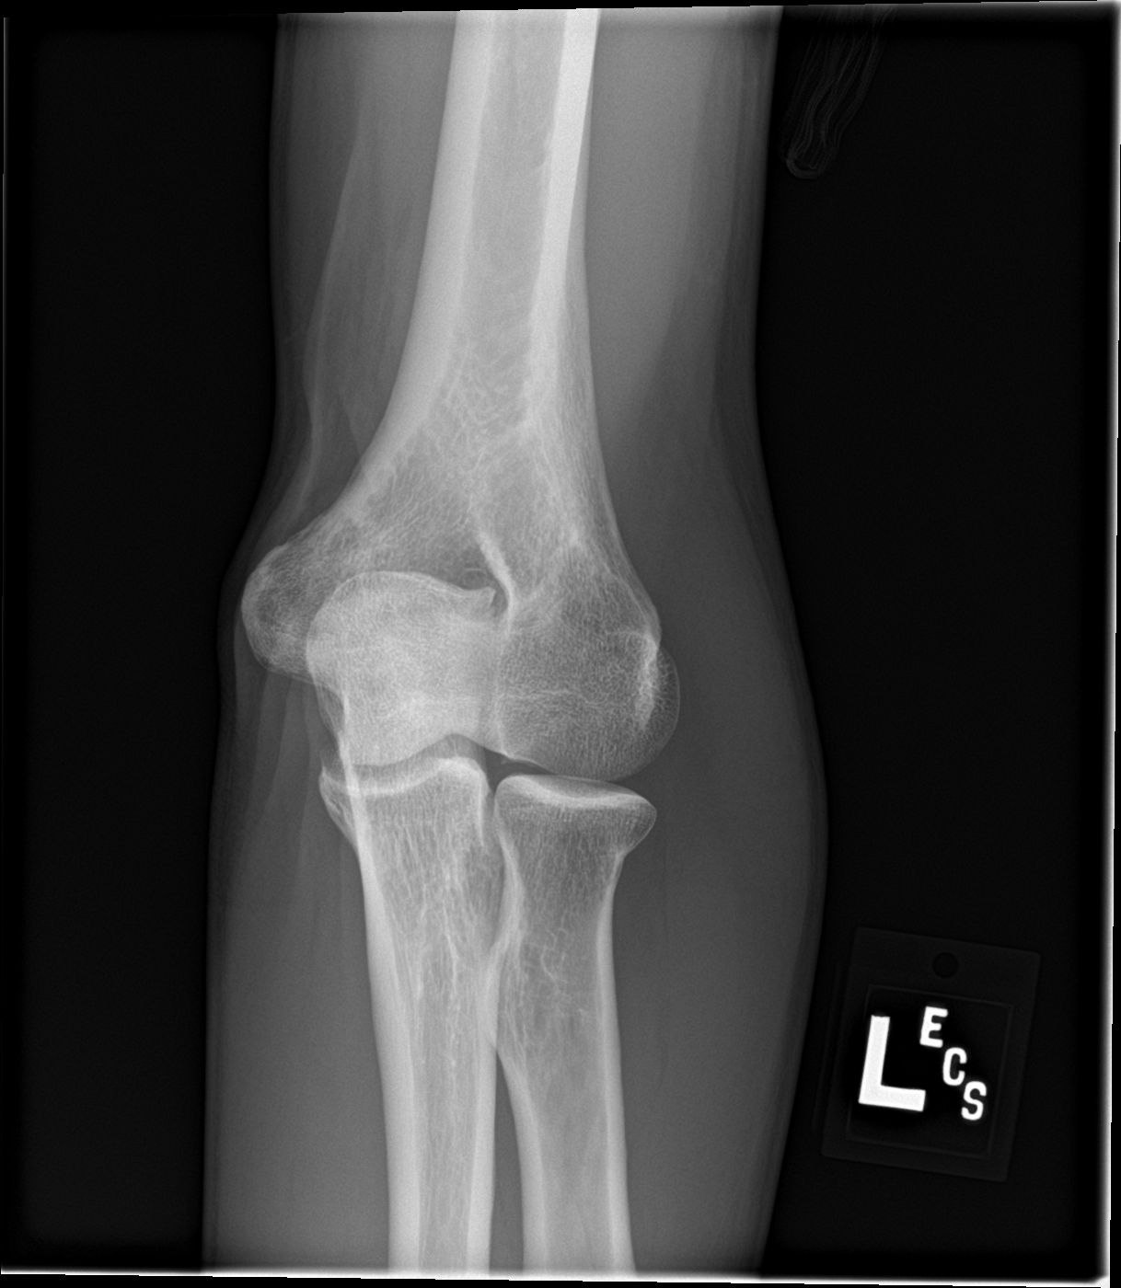

[elbow obl (2 of 2)]
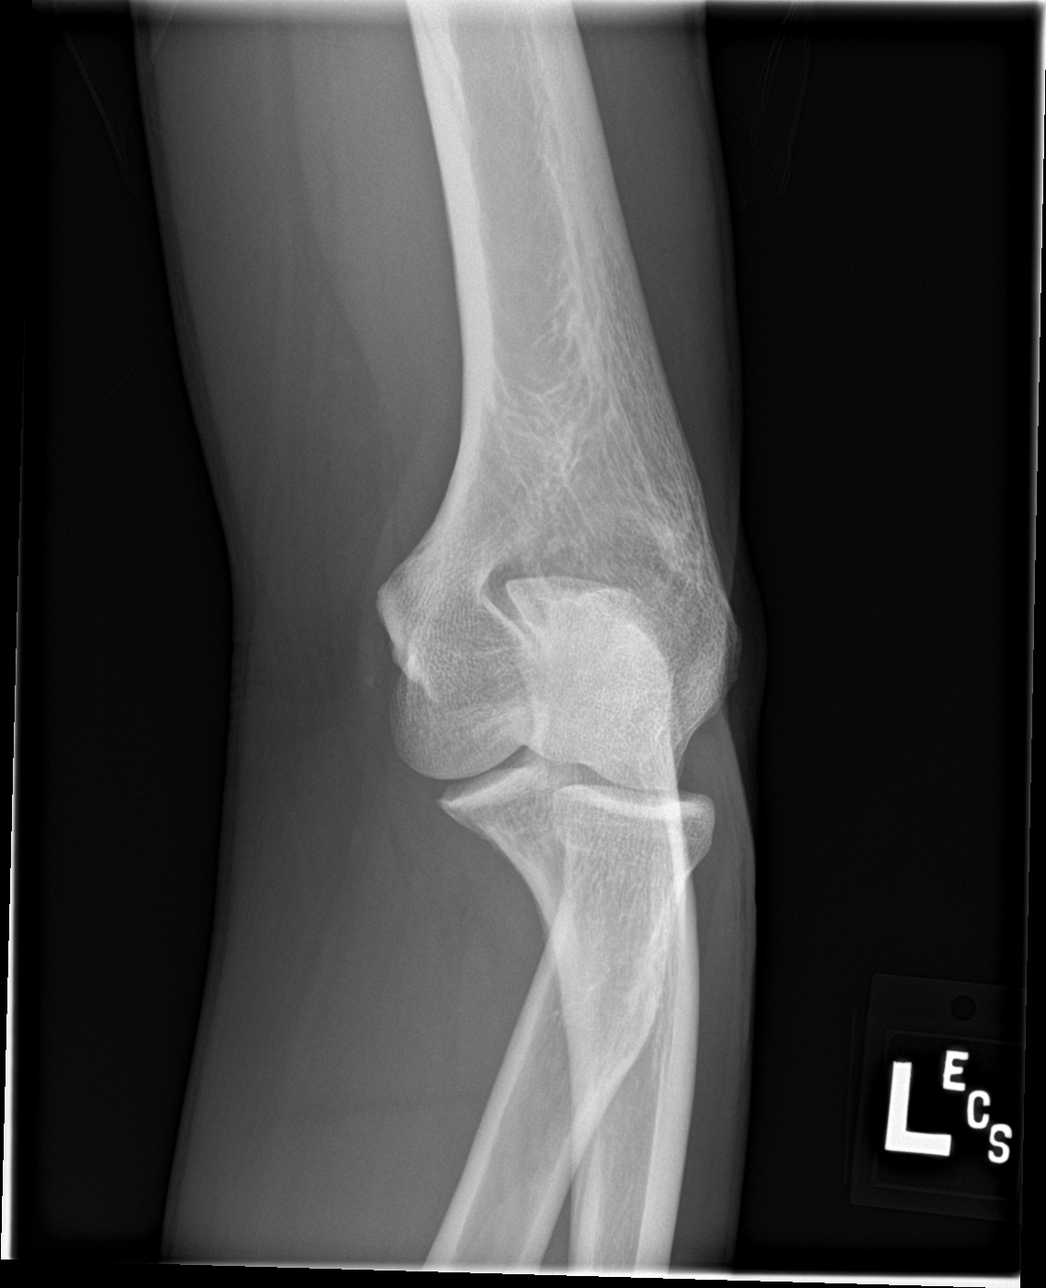

[elbow lat]
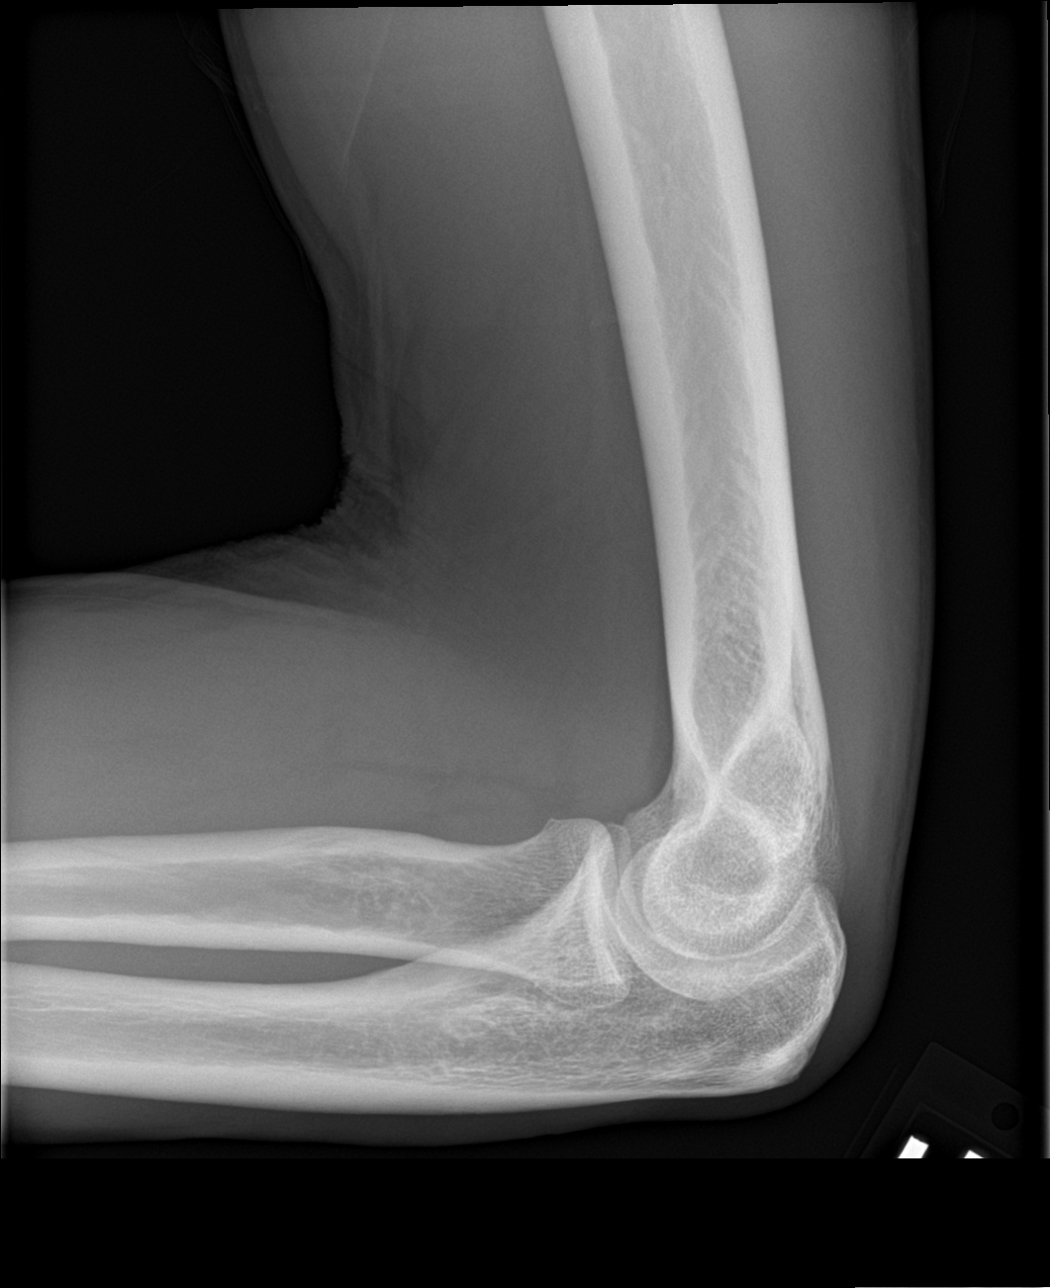

[4 of 4 positions shown; findings below may reference images not displayed]

FINDINGS: There is no evidence of fracture, dislocation, or joint effusion.
There is no evidence of arthropathy or other focal bone abnormality.
Soft tissues are unremarkable.
IMPRESSION: Negative.

## 2023-04-09 ENCOUNTER — Ambulatory Visit: Payer: Medicaid Other | Attending: Internal Medicine

## 2023-04-09 DIAGNOSIS — I739 Peripheral vascular disease, unspecified: Secondary | ICD-10-CM

## 2023-04-09 DIAGNOSIS — E782 Mixed hyperlipidemia: Secondary | ICD-10-CM

## 2023-04-09 LAB — ALT: ALT: 33 IU/L (ref 0–44)

## 2023-04-09 LAB — LIPID PANEL
Chol/HDL Ratio: 2.9 ratio (ref 0.0–5.0)
Cholesterol, Total: 143 mg/dL (ref 100–199)
HDL: 50 mg/dL (ref >39)
LDL Chol Calc (NIH): 77 mg/dL (ref 0–99)
Triglycerides: 80 mg/dL (ref 0–149)
VLDL Cholesterol Cal: 16 mg/dL (ref 5–40)

## 2023-04-13 ENCOUNTER — Ambulatory Visit: Payer: Medicaid Other

## 2023-04-13 ENCOUNTER — Telehealth: Payer: Self-pay | Admitting: Internal Medicine

## 2023-04-13 ENCOUNTER — Other Ambulatory Visit: Payer: Medicaid Other

## 2023-04-13 DIAGNOSIS — E782 Mixed hyperlipidemia: Secondary | ICD-10-CM

## 2023-04-13 NOTE — Telephone Encounter (Signed)
Pt currently taking atorvastatin 80 mg sent message to MD.  Asked if would like to increase to 80 mg or start zetia 10 mg.

## 2023-04-13 NOTE — Telephone Encounter (Signed)
Patient is returning call to discuss lab results. 

## 2023-04-16 ENCOUNTER — Other Ambulatory Visit: Payer: Self-pay

## 2023-04-16 MED ORDER — EZETIMIBE 10 MG PO TABS
10.0000 mg | ORAL_TABLET | Freq: Every day | ORAL | 3 refills | Status: DC
  Filled 2023-04-16: qty 90, 90d supply, fill #0
  Filled 2023-07-09: qty 90, 90d supply, fill #1
  Filled 2023-10-15: qty 90, 90d supply, fill #2
  Filled 2024-01-06: qty 90, 90d supply, fill #3

## 2023-04-16 NOTE — Telephone Encounter (Signed)
Pt spouse advised of MD recommendation to start Zetia 10 mg PO QD.  Will have f/u labs on 07/10/23.  No questions or concerns voiced.

## 2023-04-17 ENCOUNTER — Other Ambulatory Visit: Payer: Self-pay

## 2023-04-22 ENCOUNTER — Other Ambulatory Visit: Payer: Self-pay

## 2023-05-01 ENCOUNTER — Other Ambulatory Visit: Payer: Self-pay

## 2023-05-11 ENCOUNTER — Other Ambulatory Visit (HOSPITAL_BASED_OUTPATIENT_CLINIC_OR_DEPARTMENT_OTHER): Payer: Self-pay

## 2023-05-12 ENCOUNTER — Other Ambulatory Visit: Payer: Self-pay

## 2023-05-12 ENCOUNTER — Ambulatory Visit (HOSPITAL_COMMUNITY)
Admission: RE | Admit: 2023-05-12 | Discharge: 2023-05-12 | Disposition: A | Discharge status: Home / Self Care | Payer: Medicaid Other | Source: Ambulatory Visit | Attending: Cardiology | Admitting: Cardiology

## 2023-05-12 DIAGNOSIS — I771 Stricture of artery: Secondary | ICD-10-CM | POA: Diagnosis present

## 2023-05-19 ENCOUNTER — Other Ambulatory Visit: Payer: Self-pay

## 2023-05-25 ENCOUNTER — Other Ambulatory Visit: Payer: Self-pay

## 2023-05-25 ENCOUNTER — Ambulatory Visit: Payer: Medicaid Other | Attending: Nurse Practitioner | Admitting: Nurse Practitioner

## 2023-05-25 ENCOUNTER — Other Ambulatory Visit: Payer: Self-pay | Admitting: Physician Assistant

## 2023-05-25 ENCOUNTER — Encounter: Payer: Self-pay | Admitting: Nurse Practitioner

## 2023-05-25 ENCOUNTER — Other Ambulatory Visit: Payer: Self-pay | Admitting: Family Medicine

## 2023-05-25 VITALS — BP 131/70 | HR 56 | Ht 67.0 in | Wt 127.0 lb

## 2023-05-25 DIAGNOSIS — J449 Chronic obstructive pulmonary disease, unspecified: Secondary | ICD-10-CM

## 2023-05-25 DIAGNOSIS — I739 Peripheral vascular disease, unspecified: Secondary | ICD-10-CM

## 2023-05-25 DIAGNOSIS — Z0000 Encounter for general adult medical examination without abnormal findings: Secondary | ICD-10-CM

## 2023-05-25 DIAGNOSIS — D649 Anemia, unspecified: Secondary | ICD-10-CM

## 2023-05-25 DIAGNOSIS — F17200 Nicotine dependence, unspecified, uncomplicated: Secondary | ICD-10-CM

## 2023-05-25 DIAGNOSIS — I10 Essential (primary) hypertension: Secondary | ICD-10-CM

## 2023-05-25 MED ORDER — LOSARTAN POTASSIUM 100 MG PO TABS
100.0000 mg | ORAL_TABLET | Freq: Every day | ORAL | 1 refills | Status: DC
Start: 2023-05-25 — End: 2024-01-06
  Filled 2023-05-25 – 2023-06-15 (×2): qty 90, 90d supply, fill #0
  Filled 2023-09-21 (×2): qty 90, 90d supply, fill #1

## 2023-05-25 MED ORDER — DEBROX 6.5 % OT SOLN
5.0000 [drp] | Freq: Two times a day (BID) | OTIC | 0 refills | Status: AC
  Filled 2023-05-25 – 2024-01-06 (×2): qty 15, 30d supply, fill #0

## 2023-05-25 MED ORDER — AMLODIPINE BESYLATE 10 MG PO TABS
10.0000 mg | ORAL_TABLET | Freq: Every day | ORAL | 1 refills | Status: DC
Start: 2023-05-25 — End: 2024-02-08
  Filled 2023-05-25 – 2023-08-10 (×2): qty 90, 90d supply, fill #0
  Filled 2023-08-19: qty 90, 90d supply, fill #1
  Filled 2023-08-21: qty 30, 30d supply, fill #1
  Filled 2023-12-11: qty 30, 30d supply, fill #2
  Filled 2024-01-06: qty 30, 30d supply, fill #3

## 2023-05-25 MED ORDER — ALBUTEROL SULFATE HFA 108 (90 BASE) MCG/ACT IN AERS
2.0000 | INHALATION_SPRAY | Freq: Four times a day (QID) | RESPIRATORY_TRACT | 2 refills | Status: DC | PRN
  Filled 2023-05-25: qty 18, 25d supply, fill #0
  Filled 2024-01-06: qty 18, 25d supply, fill #1
  Filled 2024-03-09: qty 18, 25d supply, fill #2

## 2023-05-25 MED ORDER — CLOPIDOGREL BISULFATE 75 MG PO TABS
75.0000 mg | ORAL_TABLET | Freq: Every day | ORAL | 1 refills | Status: DC
Start: 2023-05-25 — End: 2024-01-06
  Filled 2023-05-25 – 2023-08-19 (×2): qty 90, 90d supply, fill #0
  Filled 2023-11-24: qty 90, 90d supply, fill #1

## 2023-05-25 MED ORDER — FLUTICASONE PROPIONATE HFA 110 MCG/ACT IN AERO
2.0000 | INHALATION_SPRAY | Freq: Every day | RESPIRATORY_TRACT | 6 refills | Status: AC
Start: 2023-05-25 — End: ?
  Filled 2023-05-25: qty 12, 30d supply, fill #0
  Filled 2024-01-06: qty 12, 30d supply, fill #1
  Filled 2024-03-09: qty 12, 30d supply, fill #2

## 2023-05-25 NOTE — Progress Notes (Signed)
Assessment & Plan:  Ruben Fuentes was seen today for annual exam.  Diagnoses and all orders for this visit:  Encounter for annual physical exam  Essential hypertension -     amLODipine (NORVASC) 10 MG tablet; Take 1 tablet (10 mg total) by mouth daily. -     losartan (COZAAR) 100 MG tablet; Take 1 tablet (100 mg total) by mouth daily.  COPD GOLD II -     fluticasone (FLOVENT HFA) 110 MCG/ACT inhaler; Inhale 2 puffs into the lungs daily.  PAD (peripheral artery disease) (HCC) -     clopidogrel (PLAVIX) 75 MG tablet; Take 1 tablet (75 mg total) by mouth daily with breakfast.  Anemia, unspecified type -     CBC with Differential  Other orders -     carbamide peroxide (DEBROX) 6.5 % OTIC solution; Place 5 drops into the right ear 2 (two) times daily.    Patient has been counseled on age-appropriate routine health concerns for screening and prevention. These are reviewed and up-to-date. Referrals have been placed accordingly. Immunizations are up-to-date or declined.    Subjective:   Chief Complaint  Patient presents with   Annual Exam   HPI Fuentes Ruben 62 y.o. male presents to office today accompanied by his wife.  He is here for his annual physical exam and has no questions or concerns today.   PMH: COPD, HTN, HPL, sinus bradycardia, GERD, claudication, S/P peripheral angiography with iliac artery stenting 04-2022/on plavix), PVD, tobacco abuse (cutting back and down to 2 cigarettes a day)     LDL above goal.  Patient has been started on Zetia recently by cardiology.  He continues to take atorvastatin 40 mg daily.    HTN Blood pressure is well controlled in office today.  He is taking losartan 100 mg daily and amlodipine 10 mg daily as prescribed. BP Readings from Last 3 Encounters:  05/25/23 131/70  01/20/23 108/68  01/16/23 128/68      Review of Systems  Constitutional:  Negative for fever, malaise/fatigue and weight loss.  HENT: Negative.  Negative for nosebleeds.    Eyes: Negative.  Negative for blurred vision, double vision and photophobia.  Respiratory: Negative.  Negative for cough and shortness of breath.   Cardiovascular: Negative.  Negative for chest pain, palpitations and leg swelling.  Gastrointestinal: Negative.  Negative for heartburn, nausea and vomiting.  Genitourinary: Negative.   Musculoskeletal: Negative.  Negative for myalgias.  Skin: Negative.   Neurological: Negative.  Negative for dizziness, focal weakness, seizures and headaches.  Endo/Heme/Allergies: Negative.   Psychiatric/Behavioral: Negative.  Negative for suicidal ideas.     Past Medical History:  Diagnosis Date   Arthritis    Bursitis    Hypertension     Past Surgical History:  Procedure Laterality Date   ABDOMINAL AORTOGRAM W/LOWER EXTREMITY N/A 04/28/2022   Procedure: ABDOMINAL AORTOGRAM W/LOWER EXTREMITY;  Surgeon: Runell Gess, MD;  Location: [facility];  Service: Cardiovascular;  Laterality: N/A;   FRACTURE SURGERY     collar bones   FRACTURE SURGERY     right ankle   PERIPHERAL VASCULAR INTERVENTION  04/28/2022   Procedure: PERIPHERAL VASCULAR INTERVENTION;  Surgeon: Runell Gess, MD;  Location: [facility];  Service: Cardiovascular;;    Family History  Problem Relation Age of Onset   Hypertension Mother    Hypertension Father    Rheum arthritis Sister    Diabetes Brother    Colon cancer Neg Hx    Esophageal  cancer Neg Hx     Social History Reviewed with no changes to be made today.   Outpatient Medications Prior to Visit  Medication Sig Dispense Refill   albuterol (VENTOLIN HFA) 108 (90 Base) MCG/ACT inhaler Inhale 2 puffs into the lungs every 6 (six) hours as needed for wheezing or shortness of breath. 6.7 g 2   aspirin EC (ASPIRIN ADULT LOW STRENGTH) 81 MG tablet Take 1 tablet (81 mg total) by mouth daily. 100 tablet 0   atorvastatin (LIPITOR) 40 MG tablet Take 1 tablet (40 mg total) by mouth daily. 90 tablet 3   b  complex vitamins capsule Take 1 capsule by mouth daily. 90 capsule 0   ezetimibe (ZETIA) 10 MG tablet Take 1 tablet (10 mg total) by mouth daily. 90 tablet 3   ferrous sulfate 325 (65 FE) MG tablet Take 1 tablet (325 mg total) by mouth daily. 30 tablet 2   gabapentin (NEURONTIN) 300 MG capsule TAKE 1 CAPSULE (300 MG TOTAL) BY MOUTH 3 (THREE) TIMES DAILY. 270 capsule 1   Multiple Vitamins-Minerals (MULTIVITAMIN WITH MINERALS) tablet Take 1 tablet by mouth daily.     pantoprazole (PROTONIX) 40 MG tablet Take 1 tablet (40 mg total) by mouth daily. 90 tablet 2   amLODipine (NORVASC) 10 MG tablet Take 1 tablet (10 mg total) by mouth daily. 90 tablet 1   clopidogrel (PLAVIX) 75 MG tablet Take 1 tablet (75 mg total) by mouth daily with breakfast. 90 tablet 1   fluticasone (FLOVENT HFA) 110 MCG/ACT inhaler INHALE 2 PUFFS INTO THE LUNGS DAILY. 12 g 6   losartan (COZAAR) 100 MG tablet Take 1 tablet (100 mg total) by mouth daily. 90 tablet 1   senna-docusate (SENOKOT-S) 8.6-50 MG tablet Take 2 tablets by mouth at bedtime. (Patient not taking: Reported on 05/25/2023) 60 tablet 3   No facility-administered medications prior to visit.    No Known Allergies     Objective:    BP 131/70 (BP Location: Left Arm, Patient Position: Sitting, Cuff Size: Normal)   Pulse (!) 56   Ht 5\' 7"  (1.702 m)   Wt 127 lb (57.6 kg)   SpO2 100%   BMI 19.89 kg/m  Wt Readings from Last 3 Encounters:  05/25/23 127 lb (57.6 kg)  01/20/23 129 lb 9.6 oz (58.8 kg)  01/16/23 127 lb (57.6 kg)    Physical Exam Constitutional:      Appearance: He is well-developed.  HENT:     Head: Normocephalic and atraumatic.     Right Ear: Hearing and external ear normal. There is impacted cerumen.     Left Ear: Hearing, tympanic membrane, ear canal and external ear normal.     Nose: Nose normal. No mucosal edema or rhinorrhea.     Right Turbinates: Not enlarged.     Left Turbinates: Not enlarged.     Mouth/Throat:     Lips: Pink.      Mouth: Mucous membranes are moist.     Dentition: No gingival swelling, dental abscesses or gum lesions.     Pharynx: Uvula midline.     Tonsils: No tonsillar exudate. 1+ on the right. 1+ on the left.  Eyes:     General: Lids are normal. No scleral icterus.    Extraocular Movements: Extraocular movements intact.     Conjunctiva/sclera: Conjunctivae normal.     Pupils: Pupils are equal, round, and reactive to light.  Neck:     Thyroid: No thyromegaly.     Trachea: No  tracheal deviation.  Cardiovascular:     Rate and Rhythm: Normal rate and regular rhythm.     Heart sounds: Normal heart sounds. No murmur heard.    No friction rub. No gallop.  Pulmonary:     Effort: Pulmonary effort is normal. No respiratory distress.     Breath sounds: Normal breath sounds. No wheezing or rales.  Chest:     Chest wall: No mass or tenderness.  Breasts:    Right: No inverted nipple, mass, nipple discharge, skin change or tenderness.     Left: No inverted nipple, mass, nipple discharge, skin change or tenderness.  Abdominal:     General: Bowel sounds are normal. There is no distension.     Palpations: Abdomen is soft. There is no mass.     Tenderness: There is no abdominal tenderness. There is no guarding or rebound.  Musculoskeletal:        General: No tenderness or deformity. Normal range of motion.     Cervical back: Normal range of motion and neck supple.  Lymphadenopathy:     Cervical: No cervical adenopathy.  Skin:    General: Skin is warm and dry.     Capillary Refill: Capillary refill takes less than 2 seconds.     Findings: No erythema.  Neurological:     Mental Status: He is alert and oriented to person, place, and time.     Cranial Nerves: No cranial nerve deficit.     Sensory: Sensation is intact.     Motor: No abnormal muscle tone.     Coordination: Coordination is intact. Coordination normal.     Gait: Gait is intact.     Deep Tendon Reflexes: Reflexes normal.     Reflex  Scores:      Patellar reflexes are 1+ on the right side and 1+ on the left side. Psychiatric:        Attention and Perception: Attention normal.        Mood and Affect: Mood normal.        Speech: Speech normal.        Behavior: Behavior normal.        Thought Content: Thought content normal.        Judgment: Judgment normal.          Patient has been counseled extensively about nutrition and exercise as well as the importance of adherence with medications and regular follow-up. The patient was given clear instructions to go to ER or return to medical center if symptoms don't improve, worsen or new problems develop. The patient verbalized understanding.   Follow-up: Return in about 3 months (around 08/25/2023).   Claiborne Rigg, FNP-BC [facility] and [facility] [place], [place] [phone]   05/25/2023, 11:53 AM

## 2023-05-26 LAB — CBC WITH DIFFERENTIAL/PLATELET
Basophils Absolute: 0 10*3/uL (ref 0.0–0.2)
Basos: 0 %
EOS (ABSOLUTE): 0 10*3/uL (ref 0.0–0.4)
Eos: 0 %
Hematocrit: 28.7 % — ABNORMAL LOW (ref 37.5–51.0)
Hemoglobin: 9.3 g/dL — ABNORMAL LOW (ref 13.0–17.7)
Immature Grans (Abs): 0 10*3/uL (ref 0.0–0.1)
Immature Granulocytes: 0 %
Lymphocytes Absolute: 2.1 10*3/uL (ref 0.7–3.1)
Lymphs: 30 %
MCH: 25.9 pg — ABNORMAL LOW (ref 26.6–33.0)
MCHC: 32.4 g/dL (ref 31.5–35.7)
MCV: 80 fL (ref 79–97)
Monocytes Absolute: 1.3 10*3/uL — ABNORMAL HIGH (ref 0.1–0.9)
Monocytes: 18 %
Neutrophils Absolute: 3.6 10*3/uL (ref 1.4–7.0)
Neutrophils: 52 %
Platelets: 367 10*3/uL (ref 150–450)
RBC: 3.59 x10E6/uL — ABNORMAL LOW (ref 4.14–5.80)
RDW: 13.3 % (ref 11.6–15.4)
WBC: 7 10*3/uL (ref 3.4–10.8)

## 2023-05-27 ENCOUNTER — Other Ambulatory Visit: Payer: Self-pay | Admitting: Nurse Practitioner

## 2023-05-27 ENCOUNTER — Other Ambulatory Visit: Payer: Self-pay

## 2023-05-27 DIAGNOSIS — D649 Anemia, unspecified: Secondary | ICD-10-CM

## 2023-06-15 ENCOUNTER — Other Ambulatory Visit: Payer: Self-pay

## 2023-06-18 ENCOUNTER — Other Ambulatory Visit: Payer: Self-pay

## 2023-06-18 ENCOUNTER — Ambulatory Visit: Payer: Medicaid Other | Attending: Nurse Practitioner

## 2023-06-18 DIAGNOSIS — D649 Anemia, unspecified: Secondary | ICD-10-CM

## 2023-06-19 LAB — CBC WITH DIFFERENTIAL/PLATELET
Basophils Absolute: 0 10*3/uL (ref 0.0–0.2)
Basos: 1 %
EOS (ABSOLUTE): 0 10*3/uL (ref 0.0–0.4)
Eos: 1 %
Hematocrit: 33.7 % — ABNORMAL LOW (ref 37.5–51.0)
Hemoglobin: 10.6 g/dL — ABNORMAL LOW (ref 13.0–17.7)
Immature Grans (Abs): 0 10*3/uL (ref 0.0–0.1)
Immature Granulocytes: 0 %
Lymphocytes Absolute: 1.8 10*3/uL (ref 0.7–3.1)
Lymphs: 29 %
MCH: 26.4 pg — ABNORMAL LOW (ref 26.6–33.0)
MCHC: 31.5 g/dL (ref 31.5–35.7)
MCV: 84 fL (ref 79–97)
Monocytes Absolute: 1 10*3/uL — ABNORMAL HIGH (ref 0.1–0.9)
Monocytes: 16 %
Neutrophils Absolute: 3.3 10*3/uL (ref 1.4–7.0)
Neutrophils: 53 %
Platelets: 358 10*3/uL (ref 150–450)
RBC: 4.02 x10E6/uL — ABNORMAL LOW (ref 4.14–5.80)
RDW: 18 % — ABNORMAL HIGH (ref 11.6–15.4)
WBC: 6.2 10*3/uL (ref 3.4–10.8)

## 2023-06-28 NOTE — Progress Notes (Unsigned)
[facility] [phone]   [phone]  INITIAL CONSULT NOTE  Patient Care Team: Claiborne Rigg, NP as PCP - General (Nurse Practitioner) Christell Constant, MD as PCP - Cardiology (Cardiology)  Hematological/Oncological History # Normocytic Anemia  10/20/2022: WBC 5.5, Hgb 7.2, MCV 67, Plt 349 01/20/2023: WBC 6.1, Hgb 12.4, MCV 88, Plt 274 06/18/2023: WBC 6.2, Hgb 10.6, MCV 84, Plt 358 06/29/2023: establish care with Dr. Leonides Schanz   CHIEF COMPLAINTS/PURPOSE OF CONSULTATION:  "Normocytic Anemia  "  HISTORY OF PRESENTING ILLNESS:  Ruben Fuentes 62 y.o. male with medical history significant for arthritis and hypertension who presents for evaluation of normocytic anemia.   On review of the previous records [name] has had consistent issues with his blood over the last year.  On 10/20/2022 the patient had a hemoglobin of 7.2 with an MCV of 67 and white blood cell count 5.5.  Etiology was unclear at that time.  His counts near normalized on 01/20/2023 with a hemoglobin of 12.4.  Most recently on 06/18/2023 patient had a hemoglobin of 10.6 with MCV of 84 and white blood cell count of 6.2.  Due to concern for his current normocytic anemia he was referred to hematology for further evaluation and management.  On exam today Mr. Ruben Fuentes is accompanied by his wife.  She reports that she figured iron pills may help him and he has been taking iron pills for the last 3 months.  He notes he did not cause any stomach upset or constipation and he has noticed no difference in his energy.  He denies any lightheadedness or dizziness but does have some occasional shortness of breath on exertion.  His energy levels are good and he currently ranks about 8 out of 10.  His appetite has been strong and he is unsure why he has lost 7 pounds this year.  He notes that he enjoys drinking tea and is taking a baby aspirin once daily but is not having any bleeding, bruising, or dark  stools.  He denies any sticky or tarry stools.  On further discussion he reports his mother and father both had lung cancer.  He had a niece who was seen in the children[facility] cancer center for some form of blood disorder.  He has a son who is healthy.  He smokes about 2 cigarettes/day and some occasional marijuana.  He drinks about 4 to 5 cans of Budweiser daily.  He notes that he used to work in [employer] but is currently retired.  He otherwise denies any fevers, chills, sweats, nausea, vomiting or diarrhea.  A full 10 point ROS is otherwise negative.  MEDICAL HISTORY:  Past Medical History:  Diagnosis Date   Arthritis    Bursitis    Hypertension     SURGICAL HISTORY: Past Surgical History:  Procedure Laterality Date   ABDOMINAL AORTOGRAM W/LOWER EXTREMITY N/A 04/28/2022   Procedure: ABDOMINAL AORTOGRAM W/LOWER EXTREMITY;  Surgeon: Runell Gess, MD;  Location: [facility] INVASIVE [facility];  Service: Cardiovascular;  Laterality: N/A;   FRACTURE SURGERY     collar bones   FRACTURE SURGERY     right ankle   PERIPHERAL VASCULAR INTERVENTION  04/28/2022   Procedure: PERIPHERAL VASCULAR INTERVENTION;  Surgeon: Runell Gess, MD;  Location: [facility]VAS[facility];  Service: Cardiovascular;;    SOCIAL HISTORY: Social History   Socioeconomic History   Marital status: Married    Spouse name: [name]   Number of children: 2  Years of education: Not on file   Highest education level: Not on file  Occupational History   Not on file  Tobacco Use   Smoking status: Some Days    Current packs/day: 0.30    Average packs/day: 0.3 packs/day for 30.0 years (9.0 ttl pk-yrs)    Types: Cigarettes   Smokeless tobacco: Never  Vaping Use   Vaping status: Never Used  Substance and Sexual Activity   Alcohol use: Yes    Alcohol/week: 2.0 standard drinks of alcohol    Types: 2 Cans of beer per week    Comment: up to 5 beers a day   Drug use: Yes    Types: Marijuana    Comment: 2 cigs daily    Sexual activity: Not Currently  Other Topics Concern   Not on file  Social History Narrative   Not on file   Social Determinants of Health   Financial Resource Strain: Medium Risk (06/02/2022)   Overall Financial Resource Strain (CARDIA)    Difficulty of Paying Living Expenses: Somewhat hard  Food Insecurity: Not on file  Transportation Needs: No Transportation Needs (06/02/2022)   PRAPARE - Transportation    Lack of Transportation (Medical): No    Lack of Transportation (Non-Medical): No  Physical Activity: Not on file  Stress: Not on file  Social Connections: Not on file  Intimate Partner Violence: Not on file    FAMILY HISTORY: Family History  Problem Relation Age of Onset   Hypertension Mother    Hypertension Father    Rheum arthritis Sister    Diabetes Brother    Colon cancer Neg Hx    Esophageal cancer Neg Hx     ALLERGIES:  has No Known Allergies.  MEDICATIONS:  Current Outpatient Medications  Medication Sig Dispense Refill   albuterol (VENTOLIN HFA) 108 (90 Base) MCG/ACT inhaler Inhale 2 puffs into the lungs every 6 (six) hours as needed for wheezing or shortness of breath. 18 g 2   amLODipine (NORVASC) 10 MG tablet Take 1 tablet (10 mg total) by mouth daily. 90 tablet 1   aspirin EC (ASPIRIN ADULT LOW STRENGTH) 81 MG tablet Take 1 tablet (81 mg total) by mouth daily. 100 tablet 0   atorvastatin (LIPITOR) 40 MG tablet Take 1 tablet (40 mg total) by mouth daily. 90 tablet 3   b complex vitamins capsule Take 1 capsule by mouth daily. 90 capsule 0   carbamide peroxide (DEBROX) 6.5 % OTIC solution Place 5 drops into the right ear 2 (two) times daily. 15 mL 0   clopidogrel (PLAVIX) 75 MG tablet Take 1 tablet (75 mg total) by mouth daily with breakfast. 90 tablet 1   ezetimibe (ZETIA) 10 MG tablet Take 1 tablet (10 mg total) by mouth daily. 90 tablet 3   ferrous sulfate 325 (65 FE) MG tablet Take 1 tablet (325 mg total) by mouth daily. 30 tablet 2   fluticasone  (FLOVENT HFA) 110 MCG/ACT inhaler Inhale 2 puffs into the lungs daily. 12 g 6   gabapentin (NEURONTIN) 300 MG capsule TAKE 1 CAPSULE (300 MG TOTAL) BY MOUTH 3 (THREE) TIMES DAILY. 270 capsule 1   losartan (COZAAR) 100 MG tablet Take 1 tablet (100 mg total) by mouth daily. 90 tablet 1   Multiple Vitamins-Minerals (MULTIVITAMIN WITH MINERALS) tablet Take 1 tablet by mouth daily.     pantoprazole (PROTONIX) 40 MG tablet Take 1 tablet (40 mg total) by mouth daily. 90 tablet 2   No current facility-administered medications for  this visit.    REVIEW OF SYSTEMS:   Constitutional: ( - ) fevers, ( - )  chills , ( - ) night sweats Eyes: ( - ) blurriness of vision, ( - ) double vision, ( - ) watery eyes Ears, nose, mouth, throat, and face: ( - ) mucositis, ( - ) sore throat Respiratory: ( - ) cough, ( - ) dyspnea, ( - ) wheezes Cardiovascular: ( - ) palpitation, ( - ) chest discomfort, ( - ) lower extremity swelling Gastrointestinal:  ( - ) nausea, ( - ) heartburn, ( - ) change in bowel habits Skin: ( - ) abnormal skin rashes Lymphatics: ( - ) new lymphadenopathy, ( - ) easy bruising Neurological: ( - ) numbness, ( - ) tingling, ( - ) new weaknesses Behavioral/Psych: ( - ) mood change, ( - ) new changes  All other systems were reviewed with the patient and are negative.  PHYSICAL EXAMINATION:  Vitals:   06/29/23 0905  BP: 136/61  Pulse: 67  Resp: 13  Temp: 97.7 F (36.5 C)  SpO2: 100%   Filed Weights   06/29/23 0905  Weight: 122 lb 11.2 oz (55.7 kg)    GENERAL: well appearing middle-aged African-American male in NAD  SKIN: skin color, texture, turgor are normal, no rashes or significant lesions EYES: conjunctiva are pink and non-injected, sclera clear LUNGS: clear to auscultation and percussion with normal breathing effort HEART: regular rate & rhythm and no murmurs and no lower extremity edema Musculoskeletal: no cyanosis of digits and no clubbing  PSYCH: alert & oriented x 3,  fluent speech NEURO: no focal motor/sensory deficits  LABORATORY DATA:  I have reviewed the data as listed    Latest Ref Rng & Units 06/29/2023    9:52 AM 06/18/2023    9:07 AM 05/25/2023   10:53 AM  CBC  WBC 4.0 - 10.5 K/uL 6.3  6.2  7.0   Hemoglobin 13.0 - 17.0 g/dL 62.1  30.8  9.3   Hematocrit 39.0 - 52.0 % 37.5  33.7  28.7   Platelets 150 - 400 K/uL 269  358  367        Latest Ref Rng & Units 06/29/2023    9:52 AM 04/09/2023    9:12 AM 01/20/2023   10:01 AM  CMP  Glucose 70 - 99 mg/dL 76   657   BUN 8 - 23 mg/dL 9   11   Creatinine 8.46 - 1.24 mg/dL 9.62   9.52   Sodium 841 - 145 mmol/L 138   138   Potassium 3.5 - 5.1 mmol/L 4.3   4.3   Chloride 98 - 111 mmol/L 104   100   CO2 22 - 32 mmol/L 26   21   Calcium 8.9 - 10.3 mg/dL 9.8   9.3   Total Protein 6.5 - 8.1 g/dL 7.8     Total Bilirubin 0.3 - 1.2 mg/dL 0.4     Alkaline Phos 38 - 126 U/L 72     AST 15 - 41 U/L 29     ALT 0 - 44 U/L 28  33       ASSESSMENT & PLAN CHRISTION LEONHARD 62 y.o. male with medical history significant for arthritis and hypertension who presents for evaluation of normocytic anemia.   After review of the labs, review of the records, and discussion with the patient the patients findings are most consistent with normocytic anemia of unclear etiology.   # Normocytic Anemia of  Unclear Etiology -- will perform full nutritional panel to include iron panel, ferritin, vitamin B12, and folate --Continue ferrous sulfate 325 mg p.o. daily, was started by his wife over-the-counter. -- will order LDH and reticulocytes as well as new CBC and CMP -- r/o MM with SPEP and SFLC -- if no clear etiology can be found will need to consider bone marrow biopsy -- RTC pending results of the above studies.   Orders Placed This Encounter  Procedures   CBC with Differential (Cancer Center Only)    Standing Status:   Future    Number of Occurrences:   1    Standing Expiration Date:   06/28/2024   CMP (Cancer Center  only)    Standing Status:   Future    Number of Occurrences:   1    Standing Expiration Date:   06/28/2024   Ferritin    Standing Status:   Future    Number of Occurrences:   1    Standing Expiration Date:   06/28/2024   Iron and Iron Binding Capacity ([facility] only)    Standing Status:   Future    Number of Occurrences:   1    Standing Expiration Date:   06/28/2024   Retic Panel    Standing Status:   Future    Number of Occurrences:   1    Standing Expiration Date:   06/28/2024   Vitamin B12    Standing Status:   Future    Number of Occurrences:   1    Standing Expiration Date:   06/28/2024   Methylmalonic acid, serum    Standing Status:   Future    Number of Occurrences:   1    Standing Expiration Date:   06/28/2024   Lactate dehydrogenase (LDH)    Standing Status:   Future    Number of Occurrences:   1    Standing Expiration Date:   06/28/2024   Folate, Serum    Standing Status:   Future    Number of Occurrences:   1    Standing Expiration Date:   06/28/2024   Multiple Myeloma Panel (SPEP&IFE w/QIG)    Standing Status:   Future    Number of Occurrences:   1    Standing Expiration Date:   06/28/2024   Kappa/lambda light chains    Standing Status:   Future    Number of Occurrences:   1    Standing Expiration Date:   06/28/2024    All questions were answered. The patient knows to call the clinic with any problems, questions or concerns.  A total of more than 60 minutes were spent on this encounter with face-to-face time and non-face-to-face time, including preparing to see the patient, ordering tests and/or medications, counseling the patient and coordination of care as outlined above.   Ulysees Barns, MD Department of Hematology/Oncology [facility] at [facility] Phone: [phone] Pager: [phone] Email: [email or web address]  06/29/2023 1:36 PM

## 2023-06-29 ENCOUNTER — Inpatient Hospital Stay (HOSPITAL_BASED_OUTPATIENT_CLINIC_OR_DEPARTMENT_OTHER): Payer: Medicaid Other | Admitting: Hematology and Oncology

## 2023-06-29 ENCOUNTER — Other Ambulatory Visit: Payer: Self-pay

## 2023-06-29 ENCOUNTER — Inpatient Hospital Stay: Payer: Medicaid Other | Attending: Hematology and Oncology

## 2023-06-29 VITALS — BP 136/61 | HR 67 | Temp 97.7°F | Resp 13 | Ht 68.0 in | Wt 122.7 lb

## 2023-06-29 DIAGNOSIS — I10 Essential (primary) hypertension: Secondary | ICD-10-CM

## 2023-06-29 DIAGNOSIS — Z7982 Long term (current) use of aspirin: Secondary | ICD-10-CM | POA: Insufficient documentation

## 2023-06-29 DIAGNOSIS — F17210 Nicotine dependence, cigarettes, uncomplicated: Secondary | ICD-10-CM

## 2023-06-29 DIAGNOSIS — D649 Anemia, unspecified: Secondary | ICD-10-CM | POA: Diagnosis present

## 2023-06-29 LAB — CBC WITH DIFFERENTIAL (CANCER CENTER ONLY)
Abs Immature Granulocytes: 0.02 10*3/uL (ref 0.00–0.07)
Basophils Absolute: 0 10*3/uL (ref 0.0–0.1)
Basophils Relative: 1 %
Eosinophils Absolute: 0.2 10*3/uL (ref 0.0–0.5)
Eosinophils Relative: 2 %
HCT: 37.5 % — ABNORMAL LOW (ref 39.0–52.0)
Hemoglobin: 12.2 g/dL — ABNORMAL LOW (ref 13.0–17.0)
Immature Granulocytes: 0 %
Lymphocytes Relative: 28 %
Lymphs Abs: 1.8 10*3/uL (ref 0.7–4.0)
MCH: 26.8 pg (ref 26.0–34.0)
MCHC: 32.5 g/dL (ref 30.0–36.0)
MCV: 82.4 fL (ref 80.0–100.0)
Monocytes Absolute: 0.8 10*3/uL (ref 0.1–1.0)
Monocytes Relative: 13 %
Neutro Abs: 3.5 10*3/uL (ref 1.7–7.7)
Neutrophils Relative %: 56 %
Platelet Count: 269 10*3/uL (ref 150–400)
RBC: 4.55 MIL/uL (ref 4.22–5.81)
Smear Review: NORMAL
WBC Count: 6.3 10*3/uL (ref 4.0–10.5)
nRBC: 0 % (ref 0.0–0.2)

## 2023-06-29 LAB — RETIC PANEL
Immature Retic Fract: 8.9 % (ref 2.3–15.9)
RBC.: 4.47 MIL/uL (ref 4.22–5.81)
Retic Count, Absolute: 56.3 10*3/uL (ref 19.0–186.0)
Retic Ct Pct: 1.3 % (ref 0.4–3.1)
Reticulocyte Hemoglobin: 33.1 pg (ref >27.9)

## 2023-06-29 LAB — IRON AND IRON BINDING CAPACITY (CC-WL,HP ONLY)
Iron: 128 ug/dL (ref 45–182)
Saturation Ratios: 31 % (ref 17.9–39.5)
TIBC: 410 ug/dL (ref 250–450)
UIBC: 282 ug/dL (ref 117–376)

## 2023-06-29 LAB — CMP (CANCER CENTER ONLY)
ALT: 28 U/L (ref 0–44)
AST: 29 U/L (ref 15–41)
Albumin: 4.8 g/dL (ref 3.5–5.0)
Alkaline Phosphatase: 72 U/L (ref 38–126)
Anion gap: 8 (ref 5–15)
BUN: 9 mg/dL (ref 8–23)
CO2: 26 mmol/L (ref 22–32)
Calcium: 9.8 mg/dL (ref 8.9–10.3)
Chloride: 104 mmol/L (ref 98–111)
Creatinine: 0.97 mg/dL (ref 0.61–1.24)
GFR, Estimated: >60 mL/min (ref >60)
Glucose, Bld: 76 mg/dL (ref 70–99)
Potassium: 4.3 mmol/L (ref 3.5–5.1)
Sodium: 138 mmol/L (ref 135–145)
Total Bilirubin: 0.4 mg/dL (ref 0.3–1.2)
Total Protein: 7.8 g/dL (ref 6.5–8.1)

## 2023-06-29 LAB — FERRITIN: Ferritin: 37 ng/mL (ref 24–336)

## 2023-06-29 LAB — LACTATE DEHYDROGENASE: LDH: 173 U/L (ref 98–192)

## 2023-06-29 LAB — FOLATE: Folate: 16.4 ng/mL (ref >5.9)

## 2023-06-29 LAB — VITAMIN B12: Vitamin B-12: 547 pg/mL (ref 180–914)

## 2023-06-30 LAB — KAPPA/LAMBDA LIGHT CHAINS
Kappa free light chain: 21.7 mg/L — ABNORMAL HIGH (ref 3.3–19.4)
Kappa, lambda light chain ratio: 1.03 (ref 0.26–1.65)
Lambda free light chains: 21.1 mg/L (ref 5.7–26.3)

## 2023-07-02 LAB — MULTIPLE MYELOMA PANEL, SERUM
Albumin SerPl Elph-Mcnc: 4.2 g/dL (ref 2.9–4.4)
Albumin/Glob SerPl: 1.6 (ref 0.7–1.7)
Alpha 1: 0.3 g/dL (ref 0.0–0.4)
Alpha2 Glob SerPl Elph-Mcnc: 0.6 g/dL (ref 0.4–1.0)
B-Globulin SerPl Elph-Mcnc: 0.9 g/dL (ref 0.7–1.3)
Gamma Glob SerPl Elph-Mcnc: 1.1 g/dL (ref 0.4–1.8)
IgA: 187 mg/dL (ref 61–437)
IgG (Immunoglobin G), Serum: 1187 mg/dL (ref 603–1613)
IgM (Immunoglobulin M), Srm: 62 mg/dL (ref 20–172)
Total Protein ELP: 7 g/dL (ref 6.0–8.5)

## 2023-07-02 LAB — METHYLMALONIC ACID, SERUM: Methylmalonic Acid, Quantitative: 140 nmol/L (ref 0–378)

## 2023-07-09 ENCOUNTER — Other Ambulatory Visit: Payer: Self-pay

## 2023-07-10 ENCOUNTER — Other Ambulatory Visit: Payer: Self-pay

## 2023-07-10 ENCOUNTER — Ambulatory Visit: Payer: Medicaid Other | Attending: Internal Medicine

## 2023-07-10 DIAGNOSIS — E782 Mixed hyperlipidemia: Secondary | ICD-10-CM

## 2023-07-23 ENCOUNTER — Telehealth: Payer: Self-pay | Admitting: *Deleted

## 2023-07-23 NOTE — Telephone Encounter (Signed)
TCT patient regarding recent lab results.  No answer but was able to leave vm message on his identified vm . Advised that his Hgb has improved to 12.2. Dr. Leonides Schanz recommends he continue the iron pills. We will plan to see him back in Oct 2024 to re-evaluate and assure his Hgb continues improving.   Advised to call back to [phone] with any questions or concerns.

## 2023-07-23 NOTE — Telephone Encounter (Signed)
-----   Message from Ulysees Barns IV sent at 07/17/2023  8:36 PM EDT ----- Please let Mr. Cookson know that his Hgb has improved to 12.2. I recommend he continue the iron pills. We will plan to see him back in Oct 2024 to re-evaluate and assure his Hgb continues improving. ----- Message ----- From: Leory Plowman, [facility] [place] Sent: 06/29/2023  10:45 AM EDT To: Jaci Standard, MD

## 2023-07-24 ENCOUNTER — Other Ambulatory Visit: Payer: Self-pay

## 2023-08-02 ENCOUNTER — Other Ambulatory Visit: Payer: Self-pay | Admitting: Family Medicine

## 2023-08-02 DIAGNOSIS — M1990 Unspecified osteoarthritis, unspecified site: Secondary | ICD-10-CM

## 2023-08-04 ENCOUNTER — Other Ambulatory Visit: Payer: Self-pay

## 2023-08-04 MED ORDER — GABAPENTIN 300 MG PO CAPS
300.0000 mg | ORAL_CAPSULE | Freq: Three times a day (TID) | ORAL | 1 refills | Status: DC
  Filled 2023-08-04: qty 270, 90d supply, fill #0
  Filled 2023-11-24: qty 270, 90d supply, fill #1

## 2023-08-07 ENCOUNTER — Other Ambulatory Visit: Payer: Self-pay

## 2023-08-11 ENCOUNTER — Other Ambulatory Visit: Payer: Self-pay

## 2023-08-11 ENCOUNTER — Ambulatory Visit: Payer: Medicaid Other | Attending: Nurse Practitioner

## 2023-08-11 DIAGNOSIS — Z23 Encounter for immunization: Secondary | ICD-10-CM | POA: Diagnosis not present

## 2023-08-11 NOTE — Addendum Note (Signed)
Addended by: Elsie Lincoln F on: 08/11/2023 04:05 PM   Modules accepted: Level of Service

## 2023-08-11 NOTE — Progress Notes (Addendum)
Flu vaccine administered per protocols.  Information sheet given. Patient denies and pain or discomfort at injection site. Tolerated injection well no reaction.  

## 2023-08-19 ENCOUNTER — Other Ambulatory Visit: Payer: Self-pay

## 2023-08-21 ENCOUNTER — Other Ambulatory Visit: Payer: Self-pay

## 2023-09-21 ENCOUNTER — Other Ambulatory Visit: Payer: Self-pay

## 2023-09-21 ENCOUNTER — Other Ambulatory Visit: Payer: Self-pay | Admitting: Hematology and Oncology

## 2023-09-21 ENCOUNTER — Inpatient Hospital Stay: Payer: Medicaid Other | Attending: Hematology and Oncology

## 2023-09-21 DIAGNOSIS — D649 Anemia, unspecified: Secondary | ICD-10-CM

## 2023-09-21 DIAGNOSIS — Z7982 Long term (current) use of aspirin: Secondary | ICD-10-CM | POA: Insufficient documentation

## 2023-09-21 DIAGNOSIS — Z79899 Other long term (current) drug therapy: Secondary | ICD-10-CM | POA: Diagnosis not present

## 2023-09-21 DIAGNOSIS — D509 Iron deficiency anemia, unspecified: Secondary | ICD-10-CM | POA: Diagnosis present

## 2023-09-21 DIAGNOSIS — F17210 Nicotine dependence, cigarettes, uncomplicated: Secondary | ICD-10-CM | POA: Diagnosis not present

## 2023-09-21 LAB — CBC WITH DIFFERENTIAL (CANCER CENTER ONLY)
Abs Immature Granulocytes: 0.01 10*3/uL (ref 0.00–0.07)
Basophils Absolute: 0 10*3/uL (ref 0.0–0.1)
Basophils Relative: 1 %
Eosinophils Absolute: 0 10*3/uL (ref 0.0–0.5)
Eosinophils Relative: 0 %
HCT: 36 % — ABNORMAL LOW (ref 39.0–52.0)
Hemoglobin: 12.3 g/dL — ABNORMAL LOW (ref 13.0–17.0)
Immature Granulocytes: 0 %
Lymphocytes Relative: 41 %
Lymphs Abs: 2.5 10*3/uL (ref 0.7–4.0)
MCH: 30.8 pg (ref 26.0–34.0)
MCHC: 34.2 g/dL (ref 30.0–36.0)
MCV: 90 fL (ref 80.0–100.0)
Monocytes Absolute: 0.9 10*3/uL (ref 0.1–1.0)
Monocytes Relative: 15 %
Neutro Abs: 2.6 10*3/uL (ref 1.7–7.7)
Neutrophils Relative %: 43 %
Platelet Count: 238 10*3/uL (ref 150–400)
RBC: 4 MIL/uL — ABNORMAL LOW (ref 4.22–5.81)
RDW: 14.2 % (ref 11.5–15.5)
WBC Count: 6 10*3/uL (ref 4.0–10.5)
nRBC: 0 % (ref 0.0–0.2)

## 2023-09-21 LAB — RETIC PANEL
Immature Retic Fract: 7.1 % (ref 2.3–15.9)
RBC.: 4.16 MIL/uL — ABNORMAL LOW (ref 4.22–5.81)
Retic Count, Absolute: 61.6 10*3/uL (ref 19.0–186.0)
Retic Ct Pct: 1.5 % (ref 0.4–3.1)
Reticulocyte Hemoglobin: 32.4 pg (ref >27.9)

## 2023-09-21 LAB — CMP (CANCER CENTER ONLY)
ALT: 35 U/L (ref 0–44)
AST: 33 U/L (ref 15–41)
Albumin: 4.4 g/dL (ref 3.5–5.0)
Alkaline Phosphatase: 69 U/L (ref 38–126)
Anion gap: 7 (ref 5–15)
BUN: 10 mg/dL (ref 8–23)
CO2: 26 mmol/L (ref 22–32)
Calcium: 9.3 mg/dL (ref 8.9–10.3)
Chloride: 102 mmol/L (ref 98–111)
Creatinine: 1.11 mg/dL (ref 0.61–1.24)
GFR, Estimated: >60 mL/min
Glucose, Bld: 91 mg/dL (ref 70–99)
Potassium: 4.2 mmol/L (ref 3.5–5.1)
Sodium: 135 mmol/L (ref 135–145)
Total Bilirubin: 0.4 mg/dL (ref 0.3–1.2)
Total Protein: 7.2 g/dL (ref 6.5–8.1)

## 2023-09-21 LAB — IRON AND IRON BINDING CAPACITY (CC-WL,HP ONLY)
Iron: 177 ug/dL (ref 45–182)
Saturation Ratios: 50 % — ABNORMAL HIGH (ref 17.9–39.5)
TIBC: 356 ug/dL (ref 250–450)
UIBC: 179 ug/dL (ref 117–376)

## 2023-09-21 LAB — FERRITIN: Ferritin: 45 ng/mL (ref 24–336)

## 2023-09-22 ENCOUNTER — Ambulatory Visit: Payer: Medicaid Other | Attending: Cardiovascular Disease | Admitting: Cardiovascular Disease

## 2023-09-22 ENCOUNTER — Encounter: Payer: Self-pay | Admitting: Cardiovascular Disease

## 2023-09-22 VITALS — BP 130/70 | HR 71 | Ht 68.0 in | Wt 122.0 lb

## 2023-09-22 DIAGNOSIS — I739 Peripheral vascular disease, unspecified: Secondary | ICD-10-CM

## 2023-09-22 DIAGNOSIS — I10 Essential (primary) hypertension: Secondary | ICD-10-CM | POA: Diagnosis not present

## 2023-09-22 DIAGNOSIS — Z720 Tobacco use: Secondary | ICD-10-CM

## 2023-09-22 NOTE — Assessment & Plan Note (Signed)
History of peripheral arterial disease with right lower extremity claudication status post peripheral vascular angiography and intervention by myself 04/28/2022 with a VBX stent placed in the right extrailiac artery and an Eluvia drug-eluting stent placed in a short segment ostial/proximal right SFA CTO.  He has three-vessel runoff bilaterally.  Claudication resolved.  His most recent Doppler studies performed 12//23 revealed his stents to be widely patent.

## 2023-09-22 NOTE — Progress Notes (Signed)
09/22/2023 Ruben Fuentes   1961-02-19  [id number]  Primary Physician Claiborne Rigg, NP Primary Cardiologist: Runell Gess MD Nicholes Calamity, [place]  HPI:  Ruben Fuentes is a 62 y.o.    thin-appearing married African-American male father of 2 children with no grandchildren who is retired from doing sheet rock work.  He was referred by Dr. Kenn File for PAD.  I last saw him in the office 04/15/2022.  He does have a history of continued tobacco abuse, treated hypertension and hyperlipidemia.  There is no family history of heart disease.  Never had heart attack or stroke.  He denies chest pain or shortness of breath.  He has had claudication for the last several months primarily involving his right leg with recent Doppler studies performed 04/02/22 revealing a right ABI of 0.50 and a left of 0.97.  He does have a high-frequency signal in his right extrailiac artery and a short segment CTO proximal right SFA.  He wishes to proceed with angiography and potential endovascular therapy.  I performed peripheral angiography on him 04/28/2022 revealing a 90% proximal right extrailiac artery stenosis which I stented with a 7 mm x 20 mm long VBX covered stent.  He had a short segment CTO of the ostium/proximal part of his right SFA which I was able to cross and stent with a 6 mm x 40 mm long Eluvia drug-eluting stent.  His ABI normalized.  His claudication has resolved.  Since I saw him over a year ago he is down to 2 cigarettes a day.  He denies chest pain or shortness of breath or claudication.  His most recent Doppler studies performed 12//23 revealed his stents to be widely patent.   Current Meds  Medication Sig   albuterol (VENTOLIN HFA) 108 (90 Base) MCG/ACT inhaler Inhale 2 puffs into the lungs every 6 (six) hours as needed for wheezing or shortness of breath.   amLODipine (NORVASC) 10 MG tablet Take 1 tablet (10 mg total) by mouth daily.   aspirin EC (ASPIRIN ADULT LOW STRENGTH) 81 MG  tablet Take 1 tablet (81 mg total) by mouth daily.   atorvastatin (LIPITOR) 40 MG tablet Take 1 tablet (40 mg total) by mouth daily.   b complex vitamins capsule Take 1 capsule by mouth daily.   carbamide peroxide (DEBROX) 6.5 % OTIC solution Place 5 drops into the right ear 2 (two) times daily.   clopidogrel (PLAVIX) 75 MG tablet Take 1 tablet (75 mg total) by mouth daily with breakfast.   ezetimibe (ZETIA) 10 MG tablet Take 1 tablet (10 mg total) by mouth daily.   ferrous sulfate 325 (65 FE) MG tablet Take 1 tablet (325 mg total) by mouth daily.   fluticasone (FLOVENT HFA) 110 MCG/ACT inhaler Inhale 2 puffs into the lungs daily.   gabapentin (NEURONTIN) 300 MG capsule Take 1 capsule (300 mg total) by mouth 3 (three) times daily.   losartan (COZAAR) 100 MG tablet Take 1 tablet (100 mg total) by mouth daily.   Multiple Vitamins-Minerals (MULTIVITAMIN WITH MINERALS) tablet Take 1 tablet by mouth daily.   pantoprazole (PROTONIX) 40 MG tablet Take 1 tablet (40 mg total) by mouth daily.     No Known Allergies  Social History   Socioeconomic History   Marital status: Married    Spouse name: Kweisi Kribs   Number of children: 2   Years of education: Not on file   Highest education level: Not on file  Occupational  History   Not on file  Tobacco Use   Smoking status: Some Days    Current packs/day: 0.30    Average packs/day: 0.3 packs/day for 30.0 years (9.0 ttl pk-yrs)    Types: Cigarettes   Smokeless tobacco: Never  Vaping Use   Vaping status: Never Used  Substance and Sexual Activity   Alcohol use: Yes    Alcohol/week: 2.0 standard drinks of alcohol    Types: 2 Cans of beer per week    Comment: up to 5 beers a day   Drug use: Yes    Types: Marijuana    Comment: 2 cigs daily   Sexual activity: Not Currently  Other Topics Concern   Not on file  Social History Narrative   Not on file   Social Determinants of Health   Financial Resource Strain: Medium Risk (06/02/2022)    Overall Financial Resource Strain (CARDIA)    Difficulty of Paying Living Expenses: Somewhat hard  Food Insecurity: Not on file  Transportation Needs: No Transportation Needs (06/02/2022)   PRAPARE - Transportation    Lack of Transportation (Medical): No    Lack of Transportation (Non-Medical): No  Physical Activity: Not on file  Stress: Not on file  Social Connections: Not on file  Intimate Partner Violence: Not on file     Review of Systems: General: negative for chills, fever, night sweats or weight changes.  Cardiovascular: negative for chest pain, dyspnea on exertion, edema, orthopnea, palpitations, paroxysmal nocturnal dyspnea or shortness of breath Dermatological: negative for rash Respiratory: negative for cough or wheezing Urologic: negative for hematuria Abdominal: negative for nausea, vomiting, diarrhea, bright red blood per rectum, melena, or hematemesis Neurologic: negative for visual changes, syncope, or dizziness All other systems reviewed and are otherwise negative except as noted above.    Blood pressure 130/70, pulse 71, height 5\' 8"  (1.727 m), weight 122 lb (55.3 kg), SpO2 99%.  General appearance: alert and no distress Neck: no adenopathy, no carotid bruit, no JVD, supple, symmetrical, trachea midline, and thyroid not enlarged, symmetric, no tenderness/mass/nodules Lungs: clear to auscultation bilaterally Heart: regular rate and rhythm, S1, S2 normal, no murmur, click, rub or gallop Extremities: extremities normal, atraumatic, no cyanosis or edema Pulses: 2+ and symmetric Skin: Skin color, texture, turgor normal. No rashes or lesions Neurologic: Grossly normal  EKG EKG Interpretation Date/Time:  Tuesday September 22 2023 09:06:02 EDT Ventricular Rate:  71 PR Interval:  148 QRS Duration:  94 QT Interval:  382 QTC Calculation: 415 R Axis:   -21  Text Interpretation: Normal sinus rhythm Minimal voltage criteria for LVH, may be normal variant ( [employer]  product ) When compared with ECG of 28-Apr-2022 07:45, Questionable change in QRS axis ST no longer elevated in Inferior leads T wave inversion now evident in Inferior leads Confirmed by Nanetta Batty [phone]) on 09/22/2023 9:40:51 AM    ASSESSMENT AND PLAN:   Essential hypertension 3 essential potential blood pressure measured today 130/70.  He is on amlodipine, and losartan.  Mixed hyperlipidemia History of hyperlipidemia on statin therapy and Zetia with lipid profile performed 07/10/2023 revealing total cholesterol 117, LDL of 57 and HDL 48.  Right leg claudication (HCC) History of peripheral arterial disease with right lower extremity claudication status post peripheral vascular angiography and intervention by myself 04/28/2022 with a VBX stent placed in the right extrailiac artery and an Eluvia drug-eluting stent placed in a short segment ostial/proximal right SFA CTO.  He has three-vessel runoff bilaterally.  Claudication resolved.  His  most recent Doppler studies performed 12//23 revealed his stents to be widely patent.  Tobacco abuse Ongoing tobacco abuse of 2 cigarettes a day with an intent to stop.     Runell Gess MD FACP,FACC,FAHA, [facility] 09/22/2023 9:49 AM

## 2023-09-22 NOTE — Assessment & Plan Note (Signed)
3 essential potential blood pressure measured today 130/70.  He is on amlodipine, and losartan.

## 2023-09-22 NOTE — Patient Instructions (Addendum)
Medication Instructions:  Your physician recommends that you continue on your current medications as directed. Please refer to the Current Medication list given to you today.  *If you need a refill on your cardiac medications before your next appointment, please call your pharmacy*   Testing/Procedures: Your physician has requested that you have an Aorta/Iliac Duplex. This will be take place at [address], [address].  No food after 11PM the night before.  Water is OK. (Don't drink liquids if you have been instructed not to for ANOTHER test) Avoid foods that produce bowel gas, for 24 hours prior to exam (see below). No breakfast, no chewing gum, no smoking or carbonated beverages. Patient may take morning medications with water. Come in for test at least 15 minutes early to register.  To do in December.  Your physician has requested that you have a lower extremity arterial duplex. During this test, ultrasound is used to evaluate arterial blood flow in the legs. Allow one hour for this exam. There are no restrictions or special instructions. This will take place at [address], [address]. To do in December.   Your physician has requested that you have an ankle brachial index (ABI). During this test an ultrasound and blood pressure cuff are used to evaluate the arteries that supply the arms and legs with blood. Allow thirty minutes for this exam. There are no restrictions or special instructions. This will take place at [address], [address]. To do in December.     Follow-Up: At [facility], you and your health needs are our priority.  As part of our continuing mission to provide you with exceptional heart care, we have created designated Provider Care Teams.  These Care Teams include your primary Cardiologist (physician) and Advanced Practice Providers (APPs -  Physician Assistants and Nurse Practitioners) who all work together to provide you with the care you  need, when you need it.  We recommend signing up for the patient portal called "MyChart".  Sign up information is provided on this After Visit Summary.  MyChart is used to connect with patients for Virtual Visits (Telemedicine).  Patients are able to view lab/test results, encounter notes, upcoming appointments, etc.  Non-urgent messages can be sent to your provider as well.   To learn more about what you can do with MyChart, go to [email or web address].    Your next appointment:   12 month(s)  Provider:   Nanetta Batty, MD

## 2023-09-22 NOTE — Assessment & Plan Note (Signed)
Ongoing tobacco abuse of 2 cigarettes a day with an intent to stop.

## 2023-09-22 NOTE — Assessment & Plan Note (Signed)
History of hyperlipidemia on statin therapy and Zetia with lipid profile performed 07/10/2023 revealing total cholesterol 117, LDL of 57 and HDL 48.

## 2023-09-28 ENCOUNTER — Inpatient Hospital Stay (HOSPITAL_BASED_OUTPATIENT_CLINIC_OR_DEPARTMENT_OTHER): Payer: Medicaid Other | Admitting: Hematology and Oncology

## 2023-09-28 VITALS — BP 140/81 | HR 61 | Temp 97.7°F | Resp 13 | Wt 124.4 lb

## 2023-09-28 DIAGNOSIS — D649 Anemia, unspecified: Secondary | ICD-10-CM

## 2023-09-28 DIAGNOSIS — D509 Iron deficiency anemia, unspecified: Secondary | ICD-10-CM | POA: Diagnosis not present

## 2023-09-28 DIAGNOSIS — D500 Iron deficiency anemia secondary to blood loss (chronic): Secondary | ICD-10-CM | POA: Diagnosis not present

## 2023-09-28 NOTE — Progress Notes (Signed)
[facility] [phone]   [phone]  PROGRESS NOTE  Patient Care Team: Claiborne Rigg, NP as PCP - General (Nurse Practitioner) Runell Gess, MD as PCP - Cardiology (Cardiology)  Hematological/Oncological History # Normocytic Anemia  # Iron Deficiency Anemia 10/20/2022: WBC 5.5, Hgb 7.2, MCV 67, Plt 349 01/20/2023: WBC 6.1, Hgb 12.4, MCV 88, Plt 274 06/18/2023: WBC 6.2, Hgb 10.6, MCV 84, Plt 358 06/29/2023: establish care with Dr. Leonides Schanz   Interval History:  Ruben Fuentes 62 y.o. male with medical history significant for normocytic anemia (most likely iron deficiency anemia) who presents for a follow up visit. The patient's last visit was on 06/29/2023. In the interim since the last visit he has continued on his p.o. iron therapy.  On exam today Ruben Fuentes is accompanied by his wife.  He reports he has been well overall in the last 3 months since our prior visit.  He reports his energy today is a 10 out of 10 and he is eating well with a good appetite.  He is not having any lightheadedness, dizziness, shortness of breath.  He is not having any bleeding, bruising, or dark stools.  He reports he is taking his iron pills as prescribed and they are not causing any stomach upset such as nausea or constipation.  He reports that he has to take the medication with "tea or orange juice".  He notes he is doing his best to try to eat iron rich food such as green leafy veggies.  He has had no recent illnesses such as runny nose, sore throat, or cough.  A full 10 point ROS is otherwise negative.  MEDICAL HISTORY:  Past Medical History:  Diagnosis Date   Arthritis    Bursitis    Hypertension     SURGICAL HISTORY: Past Surgical History:  Procedure Laterality Date   ABDOMINAL AORTOGRAM W/LOWER EXTREMITY N/A 04/28/2022   Procedure: ABDOMINAL AORTOGRAM W/LOWER EXTREMITY;  Surgeon: Runell Gess, MD;  Location: [facility];  Service:  Cardiovascular;  Laterality: N/A;   FRACTURE SURGERY     collar bones   FRACTURE SURGERY     right ankle   PERIPHERAL VASCULAR INTERVENTION  04/28/2022   Procedure: PERIPHERAL VASCULAR INTERVENTION;  Surgeon: Runell Gess, MD;  Location: [facility];  Service: Cardiovascular;;    SOCIAL HISTORY: Social History   Socioeconomic History   Marital status: Married    Spouse name: Kiven Sherwin   Number of children: 2   Years of education: Not on file   Highest education level: Not on file  Occupational History   Not on file  Tobacco Use   Smoking status: Some Days    Current packs/day: 0.30    Average packs/day: 0.3 packs/day for 30.0 years (9.0 ttl pk-yrs)    Types: Cigarettes   Smokeless tobacco: Never  Vaping Use   Vaping status: Never Used  Substance and Sexual Activity   Alcohol use: Yes    Alcohol/week: 2.0 standard drinks of alcohol    Types: 2 Cans of beer per week    Comment: up to 5 beers a day   Drug use: Yes    Types: Marijuana    Comment: 2 cigs daily   Sexual activity: Not Currently  Other Topics Concern   Not on file  Social History Narrative   Not on file   Social Determinants of Health   Financial Resource Strain: Medium Risk (06/02/2022)   Overall  Financial Resource Strain (CARDIA)    Difficulty of Paying Living Expenses: Somewhat hard  Food Insecurity: Not on file  Transportation Needs: No Transportation Needs (06/02/2022)   PRAPARE - Transportation    Lack of Transportation (Medical): No    Lack of Transportation (Non-Medical): No  Physical Activity: Not on file  Stress: Not on file  Social Connections: Not on file  Intimate Partner Violence: Not on file    FAMILY HISTORY: Family History  Problem Relation Age of Onset   Hypertension Mother    Hypertension Father    Rheum arthritis Sister    Diabetes Brother    Colon cancer Neg Hx    Esophageal cancer Neg Hx     ALLERGIES:  has No Known Allergies.  MEDICATIONS:   Current Outpatient Medications  Medication Sig Dispense Refill   albuterol (VENTOLIN HFA) 108 (90 Base) MCG/ACT inhaler Inhale 2 puffs into the lungs every 6 (six) hours as needed for wheezing or shortness of breath. 18 g 2   amLODipine (NORVASC) 10 MG tablet Take 1 tablet (10 mg total) by mouth daily. 90 tablet 1   aspirin EC (ASPIRIN ADULT LOW STRENGTH) 81 MG tablet Take 1 tablet (81 mg total) by mouth daily. 100 tablet 0   atorvastatin (LIPITOR) 40 MG tablet Take 1 tablet (40 mg total) by mouth daily. 90 tablet 3   b complex vitamins capsule Take 1 capsule by mouth daily. 90 capsule 0   carbamide peroxide (DEBROX) 6.5 % OTIC solution Place 5 drops into the right ear 2 (two) times daily. 15 mL 0   clopidogrel (PLAVIX) 75 MG tablet Take 1 tablet (75 mg total) by mouth daily with breakfast. 90 tablet 1   ezetimibe (ZETIA) 10 MG tablet Take 1 tablet (10 mg total) by mouth daily. 90 tablet 3   ferrous sulfate 325 (65 FE) MG tablet Take 1 tablet (325 mg total) by mouth daily. 30 tablet 2   fluticasone (FLOVENT HFA) 110 MCG/ACT inhaler Inhale 2 puffs into the lungs daily. 12 g 6   gabapentin (NEURONTIN) 300 MG capsule Take 1 capsule (300 mg total) by mouth 3 (three) times daily. 270 capsule 1   losartan (COZAAR) 100 MG tablet Take 1 tablet (100 mg total) by mouth daily. 90 tablet 1   Multiple Vitamins-Minerals (MULTIVITAMIN WITH MINERALS) tablet Take 1 tablet by mouth daily.     pantoprazole (PROTONIX) 40 MG tablet Take 1 tablet (40 mg total) by mouth daily. 90 tablet 2   No current facility-administered medications for this visit.    REVIEW OF SYSTEMS:   Constitutional: ( - ) fevers, ( - )  chills , ( - ) night sweats Eyes: ( - ) blurriness of vision, ( - ) double vision, ( - ) watery eyes Ears, nose, mouth, throat, and face: ( - ) mucositis, ( - ) sore throat Respiratory: ( - ) cough, ( - ) dyspnea, ( - ) wheezes Cardiovascular: ( - ) palpitation, ( - ) chest discomfort, ( - ) lower  extremity swelling Gastrointestinal:  ( - ) nausea, ( - ) heartburn, ( - ) change in bowel habits Skin: ( - ) abnormal skin rashes Lymphatics: ( - ) new lymphadenopathy, ( - ) easy bruising Neurological: ( - ) numbness, ( - ) tingling, ( - ) new weaknesses Behavioral/Psych: ( - ) mood change, ( - ) new changes  All other systems were reviewed with the patient and are negative.  PHYSICAL EXAMINATION:  Vitals:  09/28/23 1007  BP: (!) 140/81  Pulse: 61  Resp: 13  Temp: 97.7 F (36.5 C)  SpO2: 98%   Filed Weights   09/28/23 1007  Weight: 124 lb 6.4 oz (56.4 kg)    GENERAL: Well-appearing middle-aged African-American male alert, no distress and comfortable SKIN: skin color, texture, turgor are normal, no rashes or significant lesions EYES: conjunctiva are pink and non-injected, sclera clear LUNGS: clear to auscultation and percussion with normal breathing effort HEART: regular rate & rhythm and no murmurs and no lower extremity edema Musculoskeletal: no cyanosis of digits and no clubbing  PSYCH: alert & oriented x 3, fluent speech NEURO: no focal motor/sensory deficits  LABORATORY DATA:  I have reviewed the data as listed    Latest Ref Rng & Units 09/21/2023    9:59 AM 06/29/2023    9:52 AM 06/18/2023    9:07 AM  CBC  WBC 4.0 - 10.5 K/uL 6.0  6.3  6.2   Hemoglobin 13.0 - 17.0 g/dL 52.8  41.3  24.4   Hematocrit 39.0 - 52.0 % 36.0  37.5  33.7   Platelets 150 - 400 K/uL 238  269  358        Latest Ref Rng & Units 09/21/2023    9:59 AM 07/10/2023    9:04 AM 06/29/2023    9:52 AM  CMP  Glucose 70 - 99 mg/dL 91   76   BUN 8 - 23 mg/dL 10   9   Creatinine 0.10 - 1.24 mg/dL 2.72   5.36   Sodium 644 - 145 mmol/L 135   138   Potassium 3.5 - 5.1 mmol/L 4.2   4.3   Chloride 98 - 111 mmol/L 102   104   CO2 22 - 32 mmol/L 26   26   Calcium 8.9 - 10.3 mg/dL 9.3   9.8   Total Protein 6.5 - 8.1 g/dL 7.2   7.8   Total Bilirubin 0.3 - 1.2 mg/dL 0.4   0.4   Alkaline Phos 38 - 126  U/L 69   72   AST 15 - 41 U/L 33   29   ALT 0 - 44 U/L 35  30  28     Lab Results  Component Value Date   MPROTEIN Not Observed 06/29/2023   Lab Results  Component Value Date   KPAFRELGTCHN 21.7 (H) 06/29/2023   LAMBDASER 21.1 06/29/2023   KAPLAMBRATIO 1.03 06/29/2023    RADIOGRAPHIC STUDIES: No results found.  ASSESSMENT & PLAN Ruben Fuentes 62 y.o. male with medical history significant for normocytic anemia (most likely iron deficiency anemia) who presents for a follow up visit.   After review of the labs, review of the records, and discussion with the patient the patients findings are most consistent with normocytic anemia which appears to be responding to iron therapy   # Normocytic Anemia of Unclear Etiology --Continue ferrous sulfate 325 mg p.o. daily, was started by his wife over-the-counter. -- The patient's blood counts do appear to be responding to p.o. iron therapy.  Would recommend continuation at this time. -- No other clear etiology for the patient's anemia --Labs today show white blood cell count 6.0, hemoglobin 12.3, MCV 90, platelets 238 -- if no clear etiology can be found and the blood counts are worsening will need to consider bone marrow biopsy -- RTC in 6 months time with labs 1 week before.  No orders of the defined types were placed in this encounter.  All questions were answered. The patient knows to call the clinic with any problems, questions or concerns.  A total of more than 30 minutes were spent on this encounter with face-to-face time and non-face-to-face time, including preparing to see the patient, ordering tests and/or medications, counseling the patient and coordination of care as outlined above.   Ulysees Barns, MD Department of Hematology/Oncology [facility] Phone: [phone] Pager: [phone] Email: [email or web address]  09/28/2023 1:54 PM

## 2023-10-05 NOTE — Progress Notes (Signed)
Ruben Fuentes    [id number]    Mar 28, 1961  Primary Care Physician:Fleming, Shea Stakes, NP  Referring Physician: Claiborne Rigg, NP [address] [place],  [place] [address]   Chief complaint: FOBT positive  HPI: 39 yr very pleasant gentleman here for evaluation of anemia. Last seen on 10/22/2021.  Today,    GI Hx: Fecal Occult Bld 10-27-22 Negative   Colonoscopy May 22, 2017 for colorectal cancer screening, good prep showed internal hemorrhoids otherwise normal exam.  Recommendation for recall colonoscopy in 10 years.    Current Outpatient Medications:    albuterol (VENTOLIN HFA) 108 (90 Base) MCG/ACT inhaler, Inhale 2 puffs into the lungs every 6 (six) hours as needed for wheezing or shortness of breath., Disp: 18 g, Rfl: 2   amLODipine (NORVASC) 10 MG tablet, Take 1 tablet (10 mg total) by mouth daily., Disp: 90 tablet, Rfl: 1   aspirin EC (ASPIRIN ADULT LOW STRENGTH) 81 MG tablet, Take 1 tablet (81 mg total) by mouth daily., Disp: 100 tablet, Rfl: 0   atorvastatin (LIPITOR) 40 MG tablet, Take 1 tablet (40 mg total) by mouth daily., Disp: 90 tablet, Rfl: 3   b complex vitamins capsule, Take 1 capsule by mouth daily., Disp: 90 capsule, Rfl: 0   carbamide peroxide (DEBROX) 6.5 % OTIC solution, Place 5 drops into the right ear 2 (two) times daily., Disp: 15 mL, Rfl: 0   clopidogrel (PLAVIX) 75 MG tablet, Take 1 tablet (75 mg total) by mouth daily with breakfast., Disp: 90 tablet, Rfl: 1   ezetimibe (ZETIA) 10 MG tablet, Take 1 tablet (10 mg total) by mouth daily., Disp: 90 tablet, Rfl: 3   ferrous sulfate 325 (65 FE) MG tablet, Take 1 tablet (325 mg total) by mouth daily., Disp: 30 tablet, Rfl: 2   fluticasone (FLOVENT HFA) 110 MCG/ACT inhaler, Inhale 2 puffs into the lungs daily., Disp: 12 g, Rfl: 6   gabapentin (NEURONTIN) 300 MG capsule, Take 1 capsule (300 mg total) by mouth 3 (three) times daily., Disp: 270 capsule, Rfl: 1   losartan  (COZAAR) 100 MG tablet, Take 1 tablet (100 mg total) by mouth daily., Disp: 90 tablet, Rfl: 1   Multiple Vitamins-Minerals (MULTIVITAMIN WITH MINERALS) tablet, Take 1 tablet by mouth daily., Disp: , Rfl:    pantoprazole (PROTONIX) 40 MG tablet, Take 1 tablet (40 mg total) by mouth daily., Disp: 90 tablet, Rfl: 2    Allergies as of 10/06/2023   (No Known Allergies)    Past Medical History:  Diagnosis Date   Arthritis    Bursitis    Hypertension     Past Surgical History:  Procedure Laterality Date   ABDOMINAL AORTOGRAM W/LOWER EXTREMITY N/A 04/28/2022   Procedure: ABDOMINAL AORTOGRAM W/LOWER EXTREMITY;  Surgeon: Runell Gess, MD;  Location: [facility];  Service: Cardiovascular;  Laterality: N/A;   FRACTURE SURGERY     collar bones   FRACTURE SURGERY     right ankle   PERIPHERAL VASCULAR INTERVENTION  04/28/2022   Procedure: PERIPHERAL VASCULAR INTERVENTION;  Surgeon: Runell Gess, MD;  Location: [facility];  Service: Cardiovascular;;    Family History  Problem Relation Age of Onset   Hypertension Mother    Hypertension Father    Rheum arthritis Sister    Diabetes Brother    Colon cancer Neg Hx    Esophageal cancer  Neg Hx     Social History   Socioeconomic History   Marital status: Married    Spouse name: Cristiano Akkerman   Number of children: 2   Years of education: Not on file   Highest education level: Not on file  Occupational History   Not on file  Tobacco Use   Smoking status: Some Days    Current packs/day: 0.30    Average packs/day: 0.3 packs/day for 30.0 years (9.0 ttl pk-yrs)    Types: Cigarettes   Smokeless tobacco: Never  Vaping Use   Vaping status: Never Used  Substance and Sexual Activity   Alcohol use: Yes    Alcohol/week: 2.0 standard drinks of alcohol    Types: 2 Cans of beer per week    Comment: up to 5 beers a day   Drug use: Yes    Types: Marijuana    Comment: 2 cigs daily   Sexual activity: Not Currently   Other Topics Concern   Not on file  Social History Narrative   Not on file   Social Determinants of Health   Financial Resource Strain: Medium Risk (06/02/2022)   Overall Financial Resource Strain (CARDIA)    Difficulty of Paying Living Expenses: Somewhat hard  Food Insecurity: Not on file  Transportation Needs: No Transportation Needs (06/02/2022)   PRAPARE - Transportation    Lack of Transportation (Medical): No    Lack of Transportation (Non-Medical): No  Physical Activity: Not on file  Stress: Not on file  Social Connections: Not on file  Intimate Partner Violence: Not on file    Review of systems: Review of Systems    Physical Exam: There were no vitals filed for this visit.  There is no height or weight on file to calculate BMI. General: well-appearing   Eyes: sclera anicteric, no redness ENT: oral mucosa moist without lesions, no cervical or supraclavicular lymphadenopathy CV: RRR, no JVD, no peripheral edema Resp: clear to auscultation bilaterally, normal RR and effort noted GI: soft, no tenderness, with active bowel sounds. No guarding or palpable organomegaly noted. Skin; warm and dry, no rash or jaundice noted Neuro: awake, alert and oriented x 3. Normal gross motor function and fluent speech   Data Reviewed:  Reviewed labs, radiology imaging, old records and pertinent past GI work up   Assessment and Plan/Recommendations:  62 yr very pleasant gentleman with h/o hypertension , active smoking history, COPD here for evaluation of positive fecal heme occult.  He is up to date with colorectal cancer screening, last colonoscopy in 2018-average risk  Do not recommend repeat colonoscopy at this point and he doesn't need to undergo annual FOBT He is due for recall screening colonoscopy in 2028  Advised patient to limit NSAID and alcohol use GERD: continue daily Pantoprazole and add Pepcid at bedtime as needed Anti reflux measures  If continue to have  persistent GERD symptoms can consider EGD for further evaluation  Discussed smoking cessation   The patient was provided an opportunity to ask questions and all were answered. The patient agreed with the plan and demonstrated an understanding of the instructions.  Iona Beard , MD   Ladona Mow Hewitt [employer]orts as a scribe for Marsa Aris, MD.,have documented all relevant documentation on the behalf of Marsa Aris, MD,as directed by  Marsa Aris, MD while in the presence of Marsa Aris, MD.   I, Marsa Aris, MD, have reviewed all documentation for this visit. The documentation on 10/05/23 for the exam, diagnosis, procedures,  and orders are all accurate and complete.    CC: Claiborne Rigg, NP

## 2023-10-06 ENCOUNTER — Ambulatory Visit (INDEPENDENT_AMBULATORY_CARE_PROVIDER_SITE_OTHER): Payer: Medicaid Other | Admitting: Gastroenterology

## 2023-10-06 ENCOUNTER — Telehealth: Payer: Self-pay | Admitting: *Deleted

## 2023-10-06 ENCOUNTER — Encounter: Payer: Self-pay | Admitting: Gastroenterology

## 2023-10-06 ENCOUNTER — Other Ambulatory Visit: Payer: Self-pay

## 2023-10-06 VITALS — BP 92/50 | HR 67 | Ht 68.0 in | Wt 123.6 lb

## 2023-10-06 DIAGNOSIS — F17210 Nicotine dependence, cigarettes, uncomplicated: Secondary | ICD-10-CM

## 2023-10-06 DIAGNOSIS — K219 Gastro-esophageal reflux disease without esophagitis: Secondary | ICD-10-CM

## 2023-10-06 DIAGNOSIS — D509 Iron deficiency anemia, unspecified: Secondary | ICD-10-CM | POA: Diagnosis not present

## 2023-10-06 DIAGNOSIS — Z716 Tobacco abuse counseling: Secondary | ICD-10-CM

## 2023-10-06 MED ORDER — NA SULFATE-K SULFATE-MG SULF 17.5-3.13-1.6 GM/177ML PO SOLN
1.0000 | Freq: Once | ORAL | 0 refills | Status: AC
Start: 2023-10-06 — End: 2023-11-03
  Filled 2023-10-06: qty 354, fill #0
  Filled 2023-11-02: qty 354, 1d supply, fill #0

## 2023-10-06 NOTE — Telephone Encounter (Signed)
Ruben Fuentes and his wife [name] have been informed about holding his clopidogrel 5 days and verbalized understanding. She wrote it down.

## 2023-10-06 NOTE — Telephone Encounter (Signed)
Patient Name: Ruben Fuentes  DOB: Dec 01, 1961 MRN: [id number]  Primary Cardiologist: Nanetta Batty, MD  Chart reviewed as part of pre-operative protocol coverage.   Patient can hold Plavix 5 days prior to procedure and should restart postprocedure when surgically safe and guided by procedure team.  Patient should continue ASA 81 mg throughout the perioperative period.    Napoleon Form, Leodis Rains, NP 10/06/2023, 11:40 AM

## 2023-10-06 NOTE — Patient Instructions (Addendum)
VISIT SUMMARY:  You came in today for a follow-up visit due to your persistently low blood counts. Although you do not have any visible signs of bleeding, we are concerned about the possibility of slow, hidden blood loss. Additionally, we discussed your ongoing efforts to quit smoking and explored potential prescription options to help you achieve this goal.  YOUR PLAN:  -CHRONIC ANEMIA: Chronic anemia means that your blood has a lower than normal number of red blood cells, which can cause fatigue and weakness. We need to investigate further to find out if there is any hidden bleeding. You are scheduled for an upper endoscopy and colonoscopy on 11/12/2023 at 10:00 AM. If these tests do not show any source of bleeding, we may consider a capsule endoscopy.  -TOBACCO USE: Continued smoking poses significant health risks, including cancer and heart disease. We recommend that you completely stop smoking. Please contact your primary care provider to discuss prescription options that can help you quit, such as nicotine patches.  INSTRUCTIONS:  Please make sure to attend your scheduled upper endoscopy and colonoscopy on 11/12/2023 at 10:00 AM. Additionally, reach out to your primary care provider to discuss prescription options for smoking cessation.  Due to recent changes in healthcare laws, you may see the results of your imaging and laboratory studies on MyChart before your provider has had a chance to review them.  We understand that in some cases there may be results that are confusing or concerning to you. Not all laboratory results come back in the same time frame and the provider may be waiting for multiple results in order to interpret others.  Please give [name] 48 hours in order for your provider to thoroughly review all the results before contacting the office for clarification of your results.    I appreciate the  opportunity to care for you  Thank You   Marsa Aris , MD

## 2023-10-06 NOTE — Telephone Encounter (Signed)
[facility] Pre-operative Risk Assessment     Request for surgical clearance:     Endoscopy Procedure  What type of surgery is being performed?     Colonoscopy/Endoscopy  When is this surgery scheduled?     11/12/2023  What type of clearance is required ?   Pharmacy  Are there any medications that need to be held prior to surgery and how long? PLAVIX  Practice name and name of physician performing surgery?      [facility]  Dr Lavon Paganini   What is your office phone and fax number?      Phone- [phone]  Fax- [phone]  Anesthesia type (None, local, MAC, general) ?       MAC

## 2023-10-07 ENCOUNTER — Encounter: Payer: Self-pay | Admitting: Gastroenterology

## 2023-10-12 ENCOUNTER — Other Ambulatory Visit: Payer: Self-pay

## 2023-10-13 ENCOUNTER — Other Ambulatory Visit: Payer: Self-pay

## 2023-10-16 ENCOUNTER — Other Ambulatory Visit: Payer: Self-pay

## 2023-11-02 ENCOUNTER — Other Ambulatory Visit: Payer: Self-pay

## 2023-11-10 ENCOUNTER — Ambulatory Visit (HOSPITAL_COMMUNITY)
Admission: RE | Admit: 2023-11-10 | Discharge: 2023-11-10 | Disposition: A | Discharge status: Home / Self Care | Payer: Medicaid Other | Source: Ambulatory Visit | Attending: Cardiology | Admitting: Cardiology

## 2023-11-10 ENCOUNTER — Ambulatory Visit (HOSPITAL_BASED_OUTPATIENT_CLINIC_OR_DEPARTMENT_OTHER)
Admission: RE | Admit: 2023-11-10 | Discharge: 2023-11-10 | Disposition: A | Discharge status: Home / Self Care | Payer: Medicaid Other | Source: Ambulatory Visit | Attending: Cardiovascular Disease | Admitting: Cardiovascular Disease

## 2023-11-10 DIAGNOSIS — I739 Peripheral vascular disease, unspecified: Secondary | ICD-10-CM

## 2023-11-10 DIAGNOSIS — Z720 Tobacco use: Secondary | ICD-10-CM | POA: Diagnosis not present

## 2023-11-10 DIAGNOSIS — I10 Essential (primary) hypertension: Secondary | ICD-10-CM | POA: Diagnosis not present

## 2023-11-10 DIAGNOSIS — Z95820 Peripheral vascular angioplasty status with implants and grafts: Secondary | ICD-10-CM | POA: Diagnosis present

## 2023-11-10 LAB — VAS US ABI WITH/WO TBI
Left ABI: 0.99
Right ABI: 0.93

## 2023-11-11 ENCOUNTER — Encounter: Payer: Self-pay | Admitting: Certified Registered Nurse Anesthetist

## 2023-11-12 ENCOUNTER — Ambulatory Visit: Payer: Medicaid Other | Admitting: Gastroenterology

## 2023-11-12 ENCOUNTER — Encounter: Payer: Self-pay | Admitting: Gastroenterology

## 2023-11-12 VITALS — BP 134/53 | HR 47 | Temp 97.9°F | Resp 13 | Ht 68.0 in | Wt 123.0 lb

## 2023-11-12 DIAGNOSIS — K5520 Angiodysplasia of colon without hemorrhage: Secondary | ICD-10-CM

## 2023-11-12 DIAGNOSIS — K5730 Diverticulosis of large intestine without perforation or abscess without bleeding: Secondary | ICD-10-CM | POA: Diagnosis not present

## 2023-11-12 DIAGNOSIS — K3189 Other diseases of stomach and duodenum: Secondary | ICD-10-CM | POA: Diagnosis not present

## 2023-11-12 DIAGNOSIS — K31819 Angiodysplasia of stomach and duodenum without bleeding: Secondary | ICD-10-CM | POA: Diagnosis not present

## 2023-11-12 DIAGNOSIS — K2970 Gastritis, unspecified, without bleeding: Secondary | ICD-10-CM | POA: Diagnosis not present

## 2023-11-12 DIAGNOSIS — D125 Benign neoplasm of sigmoid colon: Secondary | ICD-10-CM

## 2023-11-12 DIAGNOSIS — D509 Iron deficiency anemia, unspecified: Secondary | ICD-10-CM

## 2023-11-12 DIAGNOSIS — K219 Gastro-esophageal reflux disease without esophagitis: Secondary | ICD-10-CM

## 2023-11-12 DIAGNOSIS — K644 Residual hemorrhoidal skin tags: Secondary | ICD-10-CM

## 2023-11-12 DIAGNOSIS — K648 Other hemorrhoids: Secondary | ICD-10-CM

## 2023-11-12 DIAGNOSIS — K319 Disease of stomach and duodenum, unspecified: Secondary | ICD-10-CM | POA: Diagnosis not present

## 2023-11-12 MED ORDER — SODIUM CHLORIDE 0.9 % IV SOLN: 500.0000 mL | INTRAVENOUS | Status: DC

## 2023-11-12 NOTE — Progress Notes (Signed)
Called to room to assist during endoscopic procedure.  Patient ID and intended procedure confirmed with present staff. Received instructions for my participation in the procedure from the performing physician.  

## 2023-11-12 NOTE — Patient Instructions (Addendum)
Await pathology results. Repeat colonoscopy in 3 years for surveillance based on pathology results. No ibuprofen, naproxen, or other non-steroidal anti-inflammatory drugs (Motrin, Advil, Aleve)                        Resume Plavix (clopidogrel) at prior dose tomorrow.  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  If the procedure report does not answer your questions, please call your gastroenterologist to clarify.  If you requested that your care partner not be given the details of your procedure findings, then the procedure report has been included in a sealed envelope for you to review at your convenience later.  YOU SHOULD EXPECT: Some feelings of bloating in the abdomen. Passage of more gas than usual.  Walking can help get rid of the air that was put into your GI tract during the procedure and reduce the bloating. If you had a lower endoscopy (such as a colonoscopy or flexible sigmoidoscopy) you may notice spotting of blood in your stool or on the toilet paper. If you underwent a bowel prep for your procedure, you may not have a normal bowel movement for a few days.  Please Note:  You might notice some irritation and congestion in your nose or some drainage.  This is from the oxygen used during your procedure.  There is no need for concern and it should clear up in a day or so.  SYMPTOMS TO REPORT IMMEDIATELY:  Following lower endoscopy (colonoscopy or flexible sigmoidoscopy):  Excessive amounts of blood in the stool  Significant tenderness or worsening of abdominal pains  Swelling of the abdomen that is new,  acute  Fever of 100F or higher  Following upper endoscopy (EGD)  Vomiting of blood or coffee ground material  New chest pain or pain under the shoulder blades  Painful or persistently difficult swallowing  New shortness of breath  Fever of 100F or higher  Black, tarry-looking stools  For urgent or emergent issues, a gastroenterologist can be reached at any hour by calling [phone]. Do not use MyChart messaging for urgent concerns.    DIET:  We do recommend a small meal at first, but then you may proceed to your regular diet.  Drink plenty of fluids but you should avoid alcoholic beverages for 24 hours.  ACTIVITY:  You should plan to take it easy for the rest of today and you should NOT DRIVE or use heavy machinery until tomorrow (because of the sedation medicines used during the test).    FOLLOW UP: Our staff will call the number listed on your records the next business day following your procedure.  We will call around 7:15- 8:00 am to check on you and address any questions or concerns that you may have  regarding the information given to you following your procedure. If we do not reach you, we will leave a message.     If any biopsies were taken you will be contacted by phone or by letter within the next 1-3 weeks.  Please call us at [phone] if you have not heard about the biopsies in 3 weeks.    SIGNATURES/CONFIDENTIALITY: You and/or your care partner have signed paperwork which will be entered into your electronic medical record.  These signatures attest to the fact that that the information above on your After Visit Summary has been reviewed and is understood.  Full responsibility of the confidentiality of this discharge information lies with you and/or your care-partner.

## 2023-11-12 NOTE — Progress Notes (Signed)
[facility] History and Physical   Primary Care Physician:  Claiborne Rigg, NP   Reason for Procedure:  Iron deficiency anemia  Plan:    EGD and colonoscopy with possible interventions as needed     HPI: Ruben Fuentes is a very pleasant 62 y.o. male here for EGD and colonoscopy for evaluation of iron deficiency anemia. Denies any nausea, vomiting, abdominal pain, melena or bright red blood per rectum  The risks and benefits as well as alternatives of endoscopic procedure(s) have been discussed and reviewed. All questions answered. The patient agrees to proceed.    Past Medical History:  Diagnosis Date   Arthritis    Bursitis    Hypertension    PAD (peripheral artery disease) (HCC)     Past Surgical History:  Procedure Laterality Date   ABDOMINAL AORTOGRAM W/LOWER EXTREMITY N/A 04/28/2022   Procedure: ABDOMINAL AORTOGRAM W/LOWER EXTREMITY;  Surgeon: Runell Gess, MD;  Location: [facility];  Service: Cardiovascular;  Laterality: N/A;   FRACTURE SURGERY     collar bones   FRACTURE SURGERY     right ankle   PERIPHERAL VASCULAR INTERVENTION  04/28/2022   Procedure: PERIPHERAL VASCULAR INTERVENTION;  Surgeon: Runell Gess, MD;  Location: [facility];  Service: Cardiovascular;;    Prior to Admission medications   Medication Sig Start Date End Date Taking? Authorizing Provider  albuterol (VENTOLIN HFA) 108 (90 Base) MCG/ACT inhaler Inhale 2 puffs into the lungs every 6 (six) hours as needed for wheezing or shortness of breath. 05/25/23  Yes Hoy Register, MD  amLODipine (NORVASC) 10 MG tablet Take 1 tablet (10 mg total) by mouth daily. 05/25/23  Yes Claiborne Rigg, NP  aspirin EC (ASPIRIN ADULT LOW STRENGTH) 81 MG tablet Take 1 tablet (81 mg total) by mouth daily. 10/21/22  Yes Hoy Register, MD  atorvastatin (LIPITOR) 40 MG tablet Take 1 tablet (40 mg total) by mouth daily. 01/19/23  Yes Chandrasekhar, Mahesh A, MD  b complex vitamins  capsule Take 1 capsule by mouth daily. 10/29/22  Yes Mardene Sayer, MD  carbamide peroxide (DEBROX) 6.5 % OTIC solution Place 5 drops into the right ear 2 (two) times daily. 05/25/23  Yes Claiborne Rigg, NP  ezetimibe (ZETIA) 10 MG tablet Take 1 tablet (10 mg total) by mouth daily. 04/16/23  Yes Chandrasekhar, Mahesh A, MD  gabapentin (NEURONTIN) 300 MG capsule Take 1 capsule (300 mg total) by mouth 3 (three) times daily. 08/04/23  Yes Claiborne Rigg, NP  losartan (COZAAR) 100 MG tablet Take 1 tablet (100 mg total) by mouth daily. 05/25/23  Yes Claiborne Rigg, NP  Multiple Vitamins-Minerals (MULTIVITAMIN WITH MINERALS) tablet Take 1 tablet by mouth daily.   Yes [provider]  pantoprazole (PROTONIX) 40 MG tablet Take 1 tablet (40 mg total) by mouth daily. 01/20/23  Yes Claiborne Rigg, NP  clopidogrel (PLAVIX) 75 MG tablet Take 1 tablet (75 mg total) by mouth daily with breakfast. 05/25/23   Claiborne Rigg, NP  ferrous sulfate 325 (65 FE) MG tablet Take 1 tablet (325 mg total) by mouth daily. 10/29/22   Mardene Sayer, MD  fluticasone (FLOVENT HFA) 110 MCG/ACT inhaler Inhale 2 puffs into the lungs daily. 05/25/23   Claiborne Rigg, NP    Current Outpatient Medications  Medication Sig Dispense Refill   albuterol (VENTOLIN HFA) 108 (90 Base) MCG/ACT inhaler Inhale 2 puffs into the lungs every 6 (six) hours as needed for wheezing  or shortness of breath. 18 g 2   amLODipine (NORVASC) 10 MG tablet Take 1 tablet (10 mg total) by mouth daily. 90 tablet 1   aspirin EC (ASPIRIN ADULT LOW STRENGTH) 81 MG tablet Take 1 tablet (81 mg total) by mouth daily. 100 tablet 0   atorvastatin (LIPITOR) 40 MG tablet Take 1 tablet (40 mg total) by mouth daily. 90 tablet 3   b complex vitamins capsule Take 1 capsule by mouth daily. 90 capsule 0   carbamide peroxide (DEBROX) 6.5 % OTIC solution Place 5 drops into the right ear 2 (two) times daily. 15 mL 0   ezetimibe (ZETIA) 10 MG tablet Take 1  tablet (10 mg total) by mouth daily. 90 tablet 3   gabapentin (NEURONTIN) 300 MG capsule Take 1 capsule (300 mg total) by mouth 3 (three) times daily. 270 capsule 1   losartan (COZAAR) 100 MG tablet Take 1 tablet (100 mg total) by mouth daily. 90 tablet 1   Multiple Vitamins-Minerals (MULTIVITAMIN WITH MINERALS) tablet Take 1 tablet by mouth daily.     pantoprazole (PROTONIX) 40 MG tablet Take 1 tablet (40 mg total) by mouth daily. 90 tablet 2   clopidogrel (PLAVIX) 75 MG tablet Take 1 tablet (75 mg total) by mouth daily with breakfast. 90 tablet 1   ferrous sulfate 325 (65 FE) MG tablet Take 1 tablet (325 mg total) by mouth daily. 30 tablet 2   fluticasone (FLOVENT HFA) 110 MCG/ACT inhaler Inhale 2 puffs into the lungs daily. 12 g 6   Current Facility-Administered Medications  Medication Dose Route Frequency Provider Last Rate Last Admin   0.9 %  sodium chloride infusion  500 mL Intravenous Continuous Zoila Ditullio, Eleonore Chiquito, MD        Allergies as of 11/12/2023   (No Known Allergies)    Family History  Problem Relation Age of Onset   Hypertension Mother    Hypertension Father    Rheum arthritis Sister    Diabetes Brother    Colon cancer Neg Hx    Esophageal cancer Neg Hx     Social History   Socioeconomic History   Marital status: Married    Spouse name: Travion Frymire   Number of children: 2   Years of education: Not on file   Highest education level: Not on file  Occupational History   Not on file  Tobacco Use   Smoking status: Some Days    Current packs/day: 0.30    Average packs/day: 0.3 packs/day for 30.0 years (9.0 ttl pk-yrs)    Types: Cigarettes   Smokeless tobacco: Never  Vaping Use   Vaping status: Never Used  Substance and Sexual Activity   Alcohol use: Yes    Alcohol/week: 2.0 standard drinks of alcohol    Types: 2 Cans of beer per week    Comment: up to 5 beers a day   Drug use: Yes    Types: Marijuana    Comment: 2 cigs daily   Sexual activity:  Not Currently  Other Topics Concern   Not on file  Social History Narrative   Not on file   Social Determinants of Health   Financial Resource Strain: Medium Risk (06/02/2022)   Overall Financial Resource Strain (CARDIA)    Difficulty of Paying Living Expenses: Somewhat hard  Food Insecurity: Not on file  Transportation Needs: No Transportation Needs (06/02/2022)   PRAPARE - Transportation    Lack of Transportation (Medical): No    Lack of Transportation (Non-Medical):  No  Physical Activity: Not on file  Stress: Not on file  Social Connections: Not on file  Intimate Partner Violence: Not on file    Review of Systems:  All other review of systems negative except as mentioned in the HPI.  Physical Exam: Vital signs in last 24 hours: BP 137/65   Pulse 63   Temp 97.9 F (36.6 C)   Resp (!) 22   Ht 5\' 8"  (1.727 m)   Wt 123 lb (55.8 kg)   SpO2 100%   BMI 18.70 kg/m  General:   Alert, NAD Lungs:  Clear .   Heart:  Regular rate and rhythm Abdomen:  Soft, nontender and nondistended. Neuro/Psych:  Alert and cooperative. Normal mood and affect. A and O x 3  Reviewed labs, radiology imaging, old records and pertinent past GI work up  Patient is appropriate for planned procedure(s) and anesthesia in an ambulatory setting   K. Scherry Ran , MD [phone]

## 2023-11-12 NOTE — Progress Notes (Signed)
0933 Robinul 0.1 mg IV given due large amount of secretions upon assessment.  MD made aware, vss 

## 2023-11-12 NOTE — Op Note (Signed)
[facility] Patient Name: Ruben Fuentes Procedure Date: 11/12/2023 9:29 AM MRN: [id number] Endoscopist: Napoleon Form , MD, [id number] Age: 62 Referring MD:  Date of Birth: 05-10-1961 Gender: Male Account #: [id number] Procedure:                Upper GI endoscopy Indications:              Suspected upper gastrointestinal bleeding in                            patient with unexplained iron deficiency anemia Medicines:                Monitored Anesthesia Care Procedure:                Pre-Anesthesia Assessment:                           - Prior to the procedure, a History and Physical                            was performed, and patient medications and                            allergies were reviewed. The patient's tolerance of                            previous anesthesia was also reviewed. The risks                            and benefits of the procedure and the sedation                            options and risks were discussed with the patient.                            All questions were answered, and informed consent                            was obtained. Prior Anticoagulants: The patient                            last took Plavix (clopidogrel) 5 days prior to the                            procedure. ASA Grade Assessment: III - A patient                            with severe systemic disease. After reviewing the                            risks and benefits, the patient was deemed in                            satisfactory condition to undergo the procedure.  After obtaining informed consent, the endoscope was                            passed under direct vision. Throughout the                            procedure, the patient's blood pressure, pulse, and                            oxygen saturations were monitored continuously. The                            Olympus scope [id number] was introduced through the                             mouth, and advanced to the second part of duodenum.                            The upper GI endoscopy was accomplished without                            difficulty. The patient tolerated the procedure                            well. Scope In: Scope Out: Findings:                 The Z-line was regular and was found 38 cm from the                            incisors.                           The examined esophagus was normal.                           Patchy mild inflammation characterized by                            congestion (edema), erosions, erythema and                            friability was found in the entire examined                            stomach. Biopsies were taken with a cold forceps                            for Helicobacter pylori testing.                           Three less than 1 mm angioectasias with typical                            arborization were found in the first portion of the  duodenum and in the second portion of the duodenum.                           The exam was otherwise without abnormality. Complications:            No immediate complications. Estimated Blood Loss:     Estimated blood loss was minimal. Impression:               - Z-line regular, 38 cm from the incisors.                           - Normal esophagus.                           - Gastritis. Biopsied.                           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                           - The examination was otherwise normal. Recommendation:           - Patient has a contact number available for                            emergencies. The signs and symptoms of potential                            delayed complications were discussed with the                            patient. Return to normal activities tomorrow.                            Written discharge instructions were provided to the                            patient.                            - Resume previous diet.                           - Continue present medications.                           - Await pathology results.                           - No ibuprofen, naproxen, or other non-steroidal                            anti-inflammatory drugs.                           - Resume Plavix (clopidogrel) at prior dose  tomorrow.                           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

## 2023-11-12 NOTE — Progress Notes (Signed)
Report given to PACU, vss 

## 2023-11-12 NOTE — Op Note (Signed)
[facility] Patient Name: Ruben Fuentes Procedure Date: 11/12/2023 9:20 AM MRN: [id number] Endoscopist: Napoleon Form , MD, [id number] Age: 62 Referring MD:  Date of Birth: [birth year] Gender: Male Account #: [id number] Procedure:                Colonoscopy Indications:              Unexplained iron deficiency anemia Medicines:                Monitored Anesthesia Care Procedure:                Pre-Anesthesia Assessment:                           - Prior to the procedure, a History and Physical                            was performed, and patient medications and                            allergies were reviewed. The patient's tolerance of                            previous anesthesia was also reviewed. The risks                            and benefits of the procedure and the sedation                            options and risks were discussed with the patient.                            All questions were answered, and informed consent                            was obtained. Prior Anticoagulants: The patient                            last took Plavix (clopidogrel) 5 days prior to the                            procedure. ASA Grade Assessment: III - A patient                            with severe systemic disease. After reviewing the                            risks and benefits, the patient was deemed in                            satisfactory condition to undergo the procedure.                           After obtaining informed consent, the colonoscope  was passed under direct vision. Throughout the                            procedure, the patient's blood pressure, pulse, and                            oxygen saturations were monitored continuously. The                            Olympus Scope [id number] was introduced through the                            anus and advanced to the the cecum, identified by                             appendiceal orifice and ileocecal valve. The                            colonoscopy was performed without difficulty. The                            patient tolerated the procedure well. The quality                            of the bowel preparation was adequate. The                            ileocecal valve, appendiceal orifice, and rectum                            were photographed. Scope In: 9:53:05 AM Scope Out: 10:09:46 AM Scope Withdrawal Time: 0 hours 14 minutes 18 seconds  Total Procedure Duration: 0 hours 16 minutes 41 seconds  Findings:                 The perianal and digital rectal examinations were                            normal.                           A few medium-sized patchy angiodysplastic lesions                            with typical arborization were found in the cecum.                           Scattered small-mouthed diverticula were found in                            the sigmoid colon, descending colon, transverse                            colon, ascending colon and cecum. There was no  evidence of diverticular bleeding.                           A 10 mm polyp was found in the sigmoid colon. The                            polyp was sessile. The polyp was removed with a                            cold snare. Resection and retrieval were complete.                           Non-bleeding external and internal hemorrhoids were                            found during retroflexion. The hemorrhoids were                            medium-sized. Complications:            No immediate complications. Estimated Blood Loss:     Estimated blood loss was minimal. Impression:               - A few colonic angiodysplastic lesions.                           - Mild diverticulosis in the sigmoid colon, in the                            descending colon, in the transverse colon, in the                            ascending colon and in the cecum.  There was no                            evidence of diverticular bleeding.                           - One 10 mm polyp in the sigmoid colon, removed                            with a cold snare. Resected and retrieved.                           - Non-bleeding external and internal hemorrhoids. Recommendation:           - Patient has a contact number available for                            emergencies. The signs and symptoms of potential                            delayed complications were discussed with the  patient. Return to normal activities tomorrow.                            Written discharge instructions were provided to the                            patient.                           - Resume previous diet.                           - Continue present medications.                           - Await pathology results.                           - Repeat colonoscopy in 3 years for surveillance                            based on pathology results.                           - See the other procedure note for documentation of                            additional recommendations. Napoleon Form, MD 11/12/2023 10:32:37 AM This report has been signed electronically.

## 2023-11-13 ENCOUNTER — Telehealth: Payer: Self-pay

## 2023-11-13 NOTE — Telephone Encounter (Signed)
Left message on follow up call. 

## 2023-11-16 LAB — SURGICAL PATHOLOGY

## 2023-11-24 ENCOUNTER — Other Ambulatory Visit: Payer: Self-pay

## 2023-11-25 ENCOUNTER — Other Ambulatory Visit: Payer: Self-pay | Admitting: *Deleted

## 2023-11-25 DIAGNOSIS — D509 Iron deficiency anemia, unspecified: Secondary | ICD-10-CM

## 2023-12-11 ENCOUNTER — Other Ambulatory Visit: Payer: Self-pay

## 2023-12-14 ENCOUNTER — Other Ambulatory Visit: Payer: Self-pay

## 2023-12-16 ENCOUNTER — Encounter: Payer: Self-pay | Admitting: Gastroenterology

## 2024-01-06 ENCOUNTER — Other Ambulatory Visit: Payer: Self-pay

## 2024-01-06 ENCOUNTER — Other Ambulatory Visit: Payer: Self-pay | Admitting: Nurse Practitioner

## 2024-01-06 ENCOUNTER — Other Ambulatory Visit: Payer: Self-pay | Admitting: Internal Medicine

## 2024-01-06 DIAGNOSIS — I739 Peripheral vascular disease, unspecified: Secondary | ICD-10-CM

## 2024-01-06 DIAGNOSIS — I10 Essential (primary) hypertension: Secondary | ICD-10-CM

## 2024-01-06 MED ORDER — LOSARTAN POTASSIUM 100 MG PO TABS
100.0000 mg | ORAL_TABLET | Freq: Every day | ORAL | 0 refills | Status: DC
  Filled 2024-01-06: qty 30, 30d supply, fill #0

## 2024-01-06 MED ORDER — CLOPIDOGREL BISULFATE 75 MG PO TABS
75.0000 mg | ORAL_TABLET | Freq: Every day | ORAL | 0 refills | Status: DC
  Filled 2024-01-06 – 2024-02-17 (×2): qty 30, 30d supply, fill #0

## 2024-01-06 MED ORDER — ATORVASTATIN CALCIUM 40 MG PO TABS
40.0000 mg | ORAL_TABLET | Freq: Every day | ORAL | 2 refills | Status: DC
  Filled 2024-01-06 – 2024-01-26 (×2): qty 90, 90d supply, fill #0
  Filled 2024-04-24: qty 90, 90d supply, fill #1
  Filled 2024-06-09 – 2024-08-04 (×2): qty 90, 90d supply, fill #2

## 2024-01-06 NOTE — Telephone Encounter (Signed)
Requested Prescriptions  Pending Prescriptions Disp Refills   losartan (COZAAR) 100 MG tablet 30 tablet 0    Sig: Take 1 tablet (100 mg total) by mouth daily.     Cardiovascular:  Angiotensin Receptor Blockers Failed - 01/06/2024  4:59 PM      Failed - Valid encounter within last 6 months    Recent Outpatient Visits           7 months ago Encounter for annual physical exam   [facility] [place] Claiborne Rigg, NP   11 months ago Essential hypertension   [facility] [place]. [facility] Claiborne Rigg, NP   1 year ago Primary hypertension   [facility] [facility] [place] Claiborne Rigg, NP   1 year ago Essential hypertension   [place] [facility] [place]. [facility] Claiborne Rigg, NP   1 year ago Essential hypertension   [place] [facility] [place]. [facility] [place], Marzella Schlein, [place]       Future Appointments             In 2 weeks Christell Constant, MD [facility]            Passed - Cr in normal range and within 180 days    Creatinine  Date Value Ref Range Status  09/21/2023 1.11 0.61 - 1.24 mg/dL Final   Creat  Date Value Ref Range Status  11/04/2016 1.10 0.70 - 1.33 mg/dL Final    Comment:      For patients > or = 63 years of age: The upper reference limit for Creatinine is approximately 13% higher for people identified as African-American.            Passed - K in normal range and within 180 days    Potassium  Date Value Ref Range Status  09/21/2023 4.2 3.5 - 5.1 mmol/L Final         Passed - Patient is not pregnant      Passed - Last BP in normal range    BP Readings from Last 1 Encounters:  11/12/23 (!) 134/53          clopidogrel (PLAVIX) 75 MG tablet 30 tablet 0    Sig: Take 1  tablet (75 mg total) by mouth daily with breakfast.     Hematology: Antiplatelets - clopidogrel Failed - 01/06/2024  4:59 PM      Failed - HCT in normal range and within 180 days    HCT  Date Value Ref Range Status  09/21/2023 36.0 (L) 39.0 - 52.0 % Final   Hematocrit  Date Value Ref Range Status  06/18/2023 33.7 (L) 37.5 - 51.0 % Final         Failed - HGB in normal range and within 180 days    Hemoglobin  Date Value Ref Range Status  09/21/2023 12.3 (L) 13.0 - 17.0 g/dL Final  56/21/3086 57.8 (L) 13.0 - 17.7 g/dL Final         Failed - Valid encounter within last 6 months    Recent Outpatient Visits           7 months ago Encounter for annual physical exam     [facility] [place]. [facility] Claiborne Rigg, NP   11 months ago Essential hypertension   [place] [facility] [place]. [facility] Claiborne Rigg, NP   1 year ago Primary hypertension   [place] [facility] [place]. [facility] Claiborne Rigg, NP   1 year ago Essential hypertension   [place] [facility] [place] [place]. [facility] Claiborne Rigg, NP   1 year ago Essential hypertension   [place] [facility] [place]. [facility] [place], Marzella Schlein, [place]       Future Appointments             In 2 weeks Christell Constant, MD [facility], [facility]            Passed - PLT in normal range and within 180 days    Platelets  Date Value Ref Range Status  06/18/2023 358 150 - 450 x10E3/uL Final   Platelet Count  Date Value Ref Range Status  09/21/2023 238 150 - 400 K/uL Final         Passed - Cr in normal range and within 360 days    Creatinine  Date Value Ref Range Status  09/21/2023 1.11 0.61 - 1.24 mg/dL Final   Creat  Date Value Ref Range Status  11/04/2016 1.10 0.70 - 1.33 mg/dL Final     Comment:      For patients > or = 63 years of age: The upper reference limit for Creatinine is approximately 13% higher for people identified as African-American.

## 2024-01-07 ENCOUNTER — Other Ambulatory Visit: Payer: Self-pay

## 2024-01-25 ENCOUNTER — Telehealth: Payer: Self-pay | Admitting: Internal Medicine

## 2024-01-25 ENCOUNTER — Encounter: Payer: Self-pay | Admitting: Internal Medicine

## 2024-01-25 ENCOUNTER — Ambulatory Visit: Payer: Medicaid Other | Attending: Internal Medicine | Admitting: Internal Medicine

## 2024-01-25 VITALS — BP 120/60 | HR 65 | Ht 68.0 in | Wt 133.8 lb

## 2024-01-25 DIAGNOSIS — J449 Chronic obstructive pulmonary disease, unspecified: Secondary | ICD-10-CM | POA: Diagnosis not present

## 2024-01-25 DIAGNOSIS — I10 Essential (primary) hypertension: Secondary | ICD-10-CM | POA: Diagnosis not present

## 2024-01-25 DIAGNOSIS — R0989 Other specified symptoms and signs involving the circulatory and respiratory systems: Secondary | ICD-10-CM | POA: Insufficient documentation

## 2024-01-25 DIAGNOSIS — I739 Peripheral vascular disease, unspecified: Secondary | ICD-10-CM | POA: Diagnosis not present

## 2024-01-25 DIAGNOSIS — I771 Stricture of artery: Secondary | ICD-10-CM

## 2024-01-25 NOTE — Telephone Encounter (Signed)
Patient's wife misplaced her phone and would like to know if anyone recovered it at the office. She states it's a black flip phone.

## 2024-01-25 NOTE — Patient Instructions (Addendum)
Medication Instructions:  Your physician recommends that you continue on your current medications as directed. Please refer to the Current Medication list given to you today.  *If you need a refill on your cardiac medications before your next appointment, please call your pharmacy*   Lab Work: NONE If you have labs (blood work) drawn today and your tests are completely normal, you will receive your results only by: MyChart Message (if you have MyChart) OR A paper copy in the mail If you have any lab test that is abnormal or we need to change your treatment, we will call you to review the results.   Testing/Procedures: Your physician has requested that you have a carotid duplex. This test is an ultrasound of the carotid arteries in your neck. It looks at blood flow through these arteries that supply the brain with blood. Allow one hour for this exam. There are no restrictions or special instructions.    Follow-Up: At [facility], you and your health needs are our priority.  As part of our continuing mission to provide you with exceptional heart care, we have created designated Provider Care Teams.  These Care Teams include your primary Cardiologist (physician) and Advanced Practice Providers (APPs -  Physician Assistants and Nurse Practitioners) who all work together to provide you with the care you need, when you need it.  We recommend signing up for the patient portal called "MyChart".  Sign up information is provided on this After Visit Summary.  MyChart is used to connect with patients for Virtual Visits (Telemedicine).  Patients are able to view lab/test results, encounter notes, upcoming appointments, etc.  Non-urgent messages can be sent to your provider as well.   To learn more about what you can do with MyChart, go to [email or web address].    Your next appointment:   1 year(s)  Provider:   Riley Lam, MD

## 2024-01-25 NOTE — Progress Notes (Signed)
Cardiology Office Note:    Date:  01/25/2024   ID:  Ruben Fuentes, DOB 06-04-1961, MRN [id number]  PCP:  Ruben Rigg, NP  [facility] Cardiologist:  Ruben Lam MD [facility] Electrophysiologist:  None   CC: Follow up primary prevention CAD  History of Present Illness:    Discussed the use of AI scribe software for clinical note transcription with the patient, who gave verbal consent to proceed.  Ruben Fuentes is a 63 y.o. male with a hx of HTN, COPD, tobacco abuse (former) who presented wiith asymptomatic sinus bradycardia and GERD in 2021.  He has a history of peripheral artery disease and underwent intervention in 2023. He is currently asymptomatic from his coronary artery disease and is on a regimen of amlodipine, Plavix, aspirin, Zetia, and atorvastatin. His LDL is 57, and his blood pressure is controlled on Cozaar.  He also has a history of peripheral arterial disease, with recent imaging in December showing stable findings. No leg symptoms and he is active around the house.  He has a carotid bruit, which was noted to be more prominent than previously. Last imaging in December showed mild disease. No chest pain, changes in breathing, syncope, or palpitations.  He has a history of smoking, having quit in 2022, but resumed smoking in 2024. He currently smokes two to three cigarettes a day.  He has COPD with stable symptoms. No recent exacerbations or changes in breathing reported.  His hypertension is well controlled with his current medication regimen.  He has chronic kidney disease stage 3.  Social:  2021: Established care 2022:  Patient has started PPI and stopped smoking, started back up intermittently. 2023: Worsening PAD- he had intervention with Dr. Allyson Fuentes 04/2022. 2024: Restarted smoking Ruben Fuentes is his wife who is former Advertising copywriter in [place] she is also Former boxer  Past Medical History:  Diagnosis Date   Arthritis    Bursitis    Hypertension     PAD (peripheral artery disease) (HCC)     Past Surgical History:  Procedure Laterality Date   ABDOMINAL AORTOGRAM W/LOWER EXTREMITY N/A 04/28/2022   Procedure: ABDOMINAL AORTOGRAM W/LOWER EXTREMITY;  Surgeon: Ruben Gess, MD;  [facility] LAB;  Service: Cardiovascular;  Laterality: N/A;   FRACTURE SURGERY     collar bones   FRACTURE SURGERY     right ankle   PERIPHERAL VASCULAR INTERVENTION  04/28/2022   Procedure: PERIPHERAL VASCULAR INTERVENTION;  Surgeon: Ruben Gess, MD;  Location: [facility] INVASIVE CV LAB;  Service: Cardiovascular;;    Current Medications: Current Meds  Medication Sig   albuterol (VENTOLIN HFA) 108 (90 Base) MCG/ACT inhaler Inhale 2 puffs into the lungs every 6 (six) hours as needed for wheezing or shortness of breath.   amLODipine (NORVASC) 10 MG tablet Take 1 tablet (10 mg total) by mouth daily.   aspirin EC (ASPIRIN ADULT LOW STRENGTH) 81 MG tablet Take 1 tablet (81 mg total) by mouth daily.   atorvastatin (LIPITOR) 40 MG tablet Take 1 tablet (40 mg total) by mouth daily.   b complex vitamins capsule Take 1 capsule by mouth daily.   carbamide peroxide (DEBROX) 6.5 % OTIC solution Place 5 drops into the right ear 2 (two) times daily.   clopidogrel (PLAVIX) 75 MG tablet Take 1 tablet (75 mg total) by mouth daily with breakfast.   ezetimibe (ZETIA) 10 MG tablet Take 1 tablet (10 mg total) by mouth daily.   ferrous sulfate 325 (65 FE)  MG tablet Take 1 tablet (325 mg total) by mouth daily.   fluticasone (FLOVENT HFA) 110 MCG/ACT inhaler Inhale 2 puffs into the lungs daily.   gabapentin (NEURONTIN) 300 MG capsule Take 1 capsule (300 mg total) by mouth 3 (three) times daily.   losartan (COZAAR) 100 MG tablet Take 1 tablet (100 mg total) by mouth daily.   Multiple Vitamins-Minerals (MULTIVITAMIN WITH MINERALS) tablet Take 1 tablet by mouth daily.   pantoprazole (PROTONIX) 40 MG tablet Take 1 tablet (40 mg total) by mouth daily.     Allergies:    Patient has no known allergies.   Social History   Socioeconomic History   Marital status: Married    Spouse name: Ruben Fuentes   Number of children: 2   Years of education: Not on file   Highest education level: Not on file  Occupational History   Not on file  Tobacco Use   Smoking status: Some Days    Current packs/day: 0.30    Average packs/day: 0.3 packs/day for 30.0 years (9.0 ttl pk-yrs)    Types: Cigarettes   Smokeless tobacco: Never  Vaping Use   Vaping status: Never Used  Substance and Sexual Activity   Alcohol use: Yes    Alcohol/week: 2.0 standard drinks of alcohol    Types: 2 Cans of beer per week    Comment: up to 5 beers a day   Drug use: Yes    Types: Marijuana    Comment: 2 cigs daily   Sexual activity: Not Currently  Other Topics Concern   Not on file  Social History Narrative   Not on file   Social Drivers of Health   Financial Resource Strain: Medium Risk (06/02/2022)   Overall Financial Resource Strain (CARDIA)    Difficulty of Paying Living Expenses: Somewhat hard  Food Insecurity: Not on file  Transportation Needs: No Transportation Needs (06/02/2022)   PRAPARE - Transportation    Lack of Transportation (Medical): No    Lack of Transportation (Non-Medical): No  Physical Activity: Not on file  Stress: Not on file  Social Connections: Not on file    Social:    Family History: The patient's family history includes Diabetes in his brother; Hypertension in his father and mother; Rheum arthritis in his sister. There is no history of Colon cancer or Esophageal cancer. History of coronary artery disease notable for father. History of heart failure notable for no members. History of arrhythmia notable for no members.  ROS:   Please see the history of present illness.     EKGs/Labs/Other Studies Reviewed:     Recent Labs: 09/21/2023: ALT 35; BUN 10; Creatinine 1.11; Hemoglobin 12.3; Platelet Count 238; Potassium 4.2; Sodium 135  Recent  Lipid Panel    Component Value Date/Time   CHOL 117 07/10/2023 0904   TRIG 53 07/10/2023 0904   HDL 48 07/10/2023 0904   CHOLHDL 2.4 07/10/2023 0904   CHOLHDL 3.3 04/29/2022 0319   VLDL 17 04/29/2022 0319   LDLCALC 57 07/10/2023 0904     Physical Exam:    VS:  BP 120/60 (BP Location: Right Arm)   Pulse 65   Ht 5\' 8"  (1.727 m)   Wt 133 lb 12.8 oz (60.7 kg)   SpO2 98%   BMI 20.34 kg/m     Wt Readings from Last 3 Encounters:  01/25/24 133 lb 12.8 oz (60.7 kg)  11/12/23 123 lb (55.8 kg)  10/06/23 123 lb 9.6 oz (56.1 kg)  Gen: No distress  Neck: No JVD; New Right carotid bruit, stable left carotid bruit Cardiac: No Rubs or Gallops, no murmur, RRR +2 radial pulses Respiratory: Clear to auscultation bilaterally, normal effort, normal  respiratory rate GI: Soft, nontender, non-distended  MS: No  edema;  moves all extremities Integument: Skin feels warm Neuro:  At time of evaluation, alert and oriented to person/place/time/situation  Psych: Normal affect, patient feels well  ASSESSMENT:    1. Right carotid bruit   2. COPD GOLD II   3. PAD (peripheral artery disease) (HCC)   4. Essential hypertension   5. Stenosis of left subclavian artery (HCC)      PLAN:    Carotid Artery Stenosis Right-sided carotid bruit more prominent than previous examinations. Last imaging in December showed mild disease. Will review imaging to confirm current status. - Order repeat carotid study to assess current status  Peripheral Arterial Disease Peripheral arterial disease managed by Dr. Allyson Fuentes. Recent imaging in December showed stable condition. No symptoms in legs reported. - Continue follow-up with Dr. Allyson Fuentes for peripheral arterial disease  Hypertension Hypertension well controlled on current medications (amlodipine, Cozaar). Blood pressure readings are stable.  Chronic Kidney Disease Stage 3 Chronic kidney disease stage 3, stable. ed.   Chronic Obstructive Pulmonary Disease  (COPD) COPD, stable. No new symptoms reported. Breathing is about the same.  Smoking Cessation Currently smokes 2-3 cigarettes per day. Discussed cessation aids including patches, lozenges, gum, and medications (Chantix, bupropion). Prefers to quit on his own but open to considering aids. Emphasized importance of quitting to prevent serious conditions like lung cancer. - Offer referral to [facility] healthcare coach for smoking cessation support - Provide information on nicotine replacement therapies and prescription medications (Chantix, bupropion) if he decides to use them - Encourage contact via MyChart or phone if he decides to pursue medication-assisted cessation  General Health Maintenance Encouraged to increase physical activity as weather improves.   Follow-up - Schedule follow-up appointment in one year.    Medication Adjustments/Labs and Tests Ordered: Current medicines are reviewed at length with the patient today.  Concerns regarding medicines are outlined above.  Orders Placed This Encounter  Procedures   VAS US CAROTID   No orders of the defined types were placed in this encounter.   Patient Instructions  Medication Instructions:  Your physician recommends that you continue on your current medications as directed. Please refer to the Current Medication list given to you today.  *If you need a refill on your cardiac medications before your next appointment, please call your pharmacy*   Lab Work: NONE If you have labs (blood work) drawn today and your tests are completely normal, you will receive your results only by: MyChart Message (if you have MyChart) OR A paper copy in the mail If you have any lab test that is abnormal or we need to change your treatment, we will call you to review the results.   Testing/Procedures: Your physician has requested that you have a carotid duplex. This test is an ultrasound of the carotid arteries in your neck. It looks at  blood flow through these arteries that supply the brain with blood. Allow one hour for this exam. There are no restrictions or special instructions.    Follow-Up: At [facility], you and your health needs are our priority.  As part of our continuing mission to provide you with exceptional heart care, we have created designated Provider Care Teams.  These Care Teams include your primary Cardiologist (  physician) and Advanced Practice Providers (APPs -  Physician Assistants and Nurse Practitioners) who all work together to provide you with the care you need, when you need it.  We recommend signing up for the patient portal called "MyChart".  Sign up information is provided on this After Visit Summary.  MyChart is used to connect with patients for Virtual Visits (Telemedicine).  Patients are able to view lab/test results, encounter notes, upcoming appointments, etc.  Non-urgent messages can be sent to your provider as well.   To learn more about what you can do with MyChart, go to [email or web address].    Your next appointment:   1 year(s)  Provider:   Riley Lam, MD       Signed, Christell Constant, MD  01/25/2024 11:06 AM    [facility]

## 2024-01-26 ENCOUNTER — Other Ambulatory Visit: Payer: Self-pay

## 2024-01-26 ENCOUNTER — Other Ambulatory Visit: Payer: Self-pay | Admitting: Nurse Practitioner

## 2024-01-26 DIAGNOSIS — I10 Essential (primary) hypertension: Secondary | ICD-10-CM

## 2024-01-26 NOTE — Telephone Encounter (Signed)
Called pt spouse advised there is no phone in our lost and found.  Spouse reports she found phone yesterday in a family members car.  No further needs at this time.

## 2024-01-27 ENCOUNTER — Other Ambulatory Visit: Payer: Self-pay

## 2024-01-29 ENCOUNTER — Other Ambulatory Visit: Payer: Self-pay

## 2024-01-29 ENCOUNTER — Other Ambulatory Visit: Payer: Self-pay | Admitting: Nurse Practitioner

## 2024-01-29 DIAGNOSIS — I10 Essential (primary) hypertension: Secondary | ICD-10-CM

## 2024-01-29 NOTE — Telephone Encounter (Signed)
Copied from CRM [id number]. Topic: Clinical - Medication Refill >> Jan 29, 2024 12:42 PM Hector Shade B wrote: Most Recent Primary Care Visit:  Provider: Vic Blackbird  Department: [facility]  Visit Type: NURSE VISIT  Date: 08/11/2023  Medication: losartan (COZAAR) 100 MG tablet  Has the patient contacted their pharmacy? Yes (Agent: If no, request that the patient contact the pharmacy for the refill. If patient does not wish to contact the pharmacy document the reason why and proceed with request.) (Agent: If yes, when and what did the pharmacy advise?)  Is this the correct pharmacy for this prescription? Yes If no, delete pharmacy and type the correct one.  This is the patient's preferred pharmacy:  [facility] [address] [place] [address] Phone: [phone] Fax: [phone]   Has the prescription been filled recently? Yes  Is the patient out of the medication? No- he has 3 to 4 pills as of right now  Has the patient been seen for an appointment in the last year OR does the patient have an upcoming appointment? Yes  Can we respond through MyChart? No  Agent: Please be advised that Rx refills may take up to 3 business days. We ask that you follow-up with your pharmacy.

## 2024-02-01 ENCOUNTER — Other Ambulatory Visit: Payer: Self-pay

## 2024-02-01 MED ORDER — LOSARTAN POTASSIUM 100 MG PO TABS
100.0000 mg | ORAL_TABLET | Freq: Every day | ORAL | 0 refills | Status: DC
  Filled 2024-02-01: qty 30, 30d supply, fill #0

## 2024-02-01 NOTE — Telephone Encounter (Signed)
 Requested medication (s) are due for refill today: yes  Requested medication (s) are on the active medication list: yes  Last refill:  01/06/24  Future visit scheduled: yes  Notes to clinic:  Unable to refill per protocol, courtesy refill already given, routing for provider approval.  Future OV 02/17/24     Requested Prescriptions  Pending Prescriptions Disp Refills   losartan (COZAAR) 100 MG tablet 30 tablet 0    Sig: Take 1 tablet (100 mg total) by mouth daily.     Cardiovascular:  Angiotensin Receptor Blockers Failed - 02/01/2024  9:43 AM      Failed - Valid encounter within last 6 months    Recent Outpatient Visits           8 months ago Encounter for annual physical exam   [facility] [place]. [facility] Claiborne Rigg, NP   1 year ago Essential hypertension   [facility] [place]. [facility] Claiborne Rigg, NP   1 year ago Primary hypertension   [facility] [place]. [facility] Claiborne Rigg, NP   1 year ago Essential hypertension   [place] [facility] [facility] [place]. [facility] Claiborne Rigg, NP   2 years ago Essential hypertension   [place] [facility] [place] [facility] Of . [facility], Marylene Land M, [place]              Passed - Cr in normal range and within 180 days    Creatinine  Date Value Ref Range Status  09/21/2023 1.11 0.61 - 1.24 mg/dL Final   Creat  Date Value Ref Range Status  11/04/2016 1.10 0.70 - 1.33 mg/dL Final    Comment:      For patients > or = 63 years of age: The upper reference limit for Creatinine is approximately 13% higher for people identified as African-American.            Passed - K in normal range and within 180 days    Potassium  Date Value Ref Range Status  09/21/2023 4.2 3.5 - 5.1 mmol/L Final         Passed - Patient is not  pregnant      Passed - Last BP in normal range    BP Readings from Last 1 Encounters:  01/25/24 120/60

## 2024-02-03 ENCOUNTER — Other Ambulatory Visit: Payer: Self-pay

## 2024-02-04 ENCOUNTER — Other Ambulatory Visit: Payer: Self-pay

## 2024-02-08 ENCOUNTER — Other Ambulatory Visit: Payer: Self-pay | Admitting: Nurse Practitioner

## 2024-02-08 ENCOUNTER — Other Ambulatory Visit: Payer: Self-pay

## 2024-02-08 DIAGNOSIS — I10 Essential (primary) hypertension: Secondary | ICD-10-CM

## 2024-02-08 MED ORDER — AMLODIPINE BESYLATE 10 MG PO TABS
10.0000 mg | ORAL_TABLET | Freq: Every day | ORAL | 0 refills | Status: DC
  Filled 2024-02-08: qty 30, 30d supply, fill #0

## 2024-02-09 ENCOUNTER — Other Ambulatory Visit: Payer: Self-pay

## 2024-02-10 ENCOUNTER — Other Ambulatory Visit: Payer: Self-pay

## 2024-02-17 ENCOUNTER — Ambulatory Visit: Payer: Medicaid Other | Attending: Nurse Practitioner | Admitting: Nurse Practitioner

## 2024-02-17 ENCOUNTER — Other Ambulatory Visit: Payer: Self-pay

## 2024-02-17 ENCOUNTER — Encounter: Payer: Self-pay | Admitting: Nurse Practitioner

## 2024-02-17 VITALS — BP 134/67 | HR 72 | Resp 19 | Ht 68.0 in | Wt 132.0 lb

## 2024-02-17 DIAGNOSIS — F17200 Nicotine dependence, unspecified, uncomplicated: Secondary | ICD-10-CM

## 2024-02-17 DIAGNOSIS — D649 Anemia, unspecified: Secondary | ICD-10-CM

## 2024-02-17 DIAGNOSIS — Z23 Encounter for immunization: Secondary | ICD-10-CM | POA: Diagnosis not present

## 2024-02-17 DIAGNOSIS — E782 Mixed hyperlipidemia: Secondary | ICD-10-CM

## 2024-02-17 DIAGNOSIS — I10 Essential (primary) hypertension: Secondary | ICD-10-CM | POA: Diagnosis not present

## 2024-02-17 DIAGNOSIS — M792 Neuralgia and neuritis, unspecified: Secondary | ICD-10-CM

## 2024-02-17 MED ORDER — AMLODIPINE BESYLATE 10 MG PO TABS
10.0000 mg | ORAL_TABLET | Freq: Every day | ORAL | 1 refills | Status: DC
  Filled 2024-02-17 – 2024-03-09 (×2): qty 90, 90d supply, fill #0
  Filled 2024-06-09: qty 90, 90d supply, fill #1

## 2024-02-17 MED ORDER — LOSARTAN POTASSIUM 100 MG PO TABS
100.0000 mg | ORAL_TABLET | Freq: Every day | ORAL | 1 refills | Status: DC
  Filled 2024-02-17 – 2024-03-02 (×2): qty 90, 90d supply, fill #0
  Filled 2024-06-09: qty 90, 90d supply, fill #1

## 2024-02-17 MED ORDER — GABAPENTIN 300 MG PO CAPS
300.0000 mg | ORAL_CAPSULE | Freq: Three times a day (TID) | ORAL | 1 refills | Status: DC
  Filled 2024-02-17 – 2024-03-09 (×2): qty 270, 90d supply, fill #0
  Filled 2024-06-28: qty 270, 90d supply, fill #1

## 2024-02-17 MED ORDER — EZETIMIBE 10 MG PO TABS
10.0000 mg | ORAL_TABLET | Freq: Every day | ORAL | 3 refills | Status: AC
  Filled 2024-02-17 – 2024-03-31 (×2): qty 90, 90d supply, fill #0
  Filled 2024-06-28: qty 90, 90d supply, fill #1
  Filled 2024-09-26: qty 90, 90d supply, fill #2
  Filled 2024-12-19: qty 90, 90d supply, fill #3

## 2024-02-17 NOTE — Telephone Encounter (Unsigned)
 Copied from CRM [id number]. Topic: Clinical - Request for Lab/Test Order >> Feb 17, 2024  8:55 AM Gildardo Pounds wrote: Reason for CRM: Patient's wife called to request that patient have labs done during today's appointment for his anemia.

## 2024-02-17 NOTE — Progress Notes (Signed)
 Assessment & Plan:  Ruben Fuentes was seen today for medical management of chronic issues.  Diagnoses and all orders for this visit:  Primary hypertension -     CMP14+EGFR -     amLODipine (NORVASC) 10 MG tablet; Take 1 tablet (10 mg total) by mouth daily. -     losartan (COZAAR) 100 MG tablet; Take 1 tablet (100 mg total) by mouth daily. Continue all antihypertensives as prescribed.  Reminded to bring in blood pressure log for follow  up appointment.  RECOMMENDATIONS: DASH/Mediterranean Diets are healthier choices for HTN.    Need for vaccination with 20-polyvalent pneumococcal conjugate vaccine -     Pneumococcal conjugate vaccine 20-valent  Tobacco dependence -     CT CHEST LUNG CA SCREEN LOW DOSE W/O CM; Future  Anemia, unspecified type -     CBC with Differential  Mixed hyperlipidemia -     Lipid panel -     ezetimibe (ZETIA) 10 MG tablet; Take 1 tablet (10 mg total) by mouth daily. INSTRUCTIONS: Work on a low fat, heart healthy diet and participate in regular aerobic exercise program by working out at least 150 minutes per week; 5 days a week-30 minutes per day. Avoid red meat/beef/steak,  fried foods. junk foods, sodas, sugary drinks, unhealthy snacking, alcohol and smoking.  Drink at least 80 oz of water per day and monitor your carbohydrate intake daily.    Neuropathic pain -     gabapentin (NEURONTIN) 300 MG capsule; Take 1 capsule (300 mg total) by mouth 3 (three) times daily.    Patient has been counseled on age-appropriate routine health concerns for screening and prevention. These are reviewed and up-to-date. Referrals have been placed accordingly. Immunizations are up-to-date or declined.    Subjective:   Chief Complaint  Patient presents with   Medical Management of Chronic Issues    Ruben Fuentes 63 y.o. male presents to office today for follow up to HTN   PMH: HTN, COPD (quit smoking but restarted recently), SB, GERD, PAD  Currently being followed by  Cardiology. Plans for carotid artery study for prominent right sided carotid bruit.   He is unsure if he has been taking plavix. It was not picked up in January from the pharmacy. I will have him go ahead and restart and he will follow up with Cardiology on Friday to see if he should continue this long term.   HTN Blood pressure is well controlled today. He is currently prescribed losartan and amlodipine.  BP Readings from Last 3 Encounters:  02/17/24 134/67  01/25/24 120/60  11/12/23 (!) 134/53     Review of Systems  Constitutional:  Negative for fever, malaise/fatigue and weight loss.  HENT: Negative.  Negative for nosebleeds.   Eyes: Negative.  Negative for blurred vision, double vision and photophobia.  Respiratory: Negative.  Negative for cough and shortness of breath.   Cardiovascular: Negative.  Negative for chest pain, palpitations and leg swelling.  Gastrointestinal: Negative.  Negative for heartburn, nausea and vomiting.  Musculoskeletal:  Positive for joint pain. Negative for myalgias.  Neurological:  Positive for sensory change. Negative for dizziness, focal weakness, seizures and headaches.  Psychiatric/Behavioral: Negative.  Negative for suicidal ideas.     Past Medical History:  Diagnosis Date   Arthritis    Bursitis    Hypertension    PAD (peripheral artery disease) (HCC)     Past Surgical History:  Procedure Laterality Date   ABDOMINAL AORTOGRAM W/LOWER EXTREMITY N/A 04/28/2022  Procedure: ABDOMINAL AORTOGRAM W/LOWER EXTREMITY;  Surgeon: Runell Gess, MD;  [facility] INVASIVE CV LAB;  Service: Cardiovascular;  Laterality: N/A;   FRACTURE SURGERY     collar bones   FRACTURE SURGERY     right ankle   PERIPHERAL VASCULAR INTERVENTION  04/28/2022   Procedure: PERIPHERAL VASCULAR INTERVENTION;  Surgeon: Runell Gess, MD;  [facility]: [facility] INVASIVE CV LAB;  Service: Cardiovascular;;    Family History  Problem Relation Age of Onset   Hypertension Mother     Hypertension Father    Rheum arthritis Sister    Diabetes Brother    Colon cancer Neg Hx    Esophageal cancer Neg Hx     Social History Reviewed with no changes to be made today.   Outpatient Medications Prior to Visit  Medication Sig Dispense Refill   albuterol (VENTOLIN HFA) 108 (90 Base) MCG/ACT inhaler Inhale 2 puffs into the lungs every 6 (six) hours as needed for wheezing or shortness of breath. 18 g 2   aspirin EC (ASPIRIN ADULT LOW STRENGTH) 81 MG tablet Take 1 tablet (81 mg total) by mouth daily. 100 tablet 0   atorvastatin (LIPITOR) 40 MG tablet Take 1 tablet (40 mg total) by mouth daily. 90 tablet 2   b complex vitamins capsule Take 1 capsule by mouth daily. 90 capsule 0   carbamide peroxide (DEBROX) 6.5 % OTIC solution Place 5 drops into the right ear 2 (two) times daily. 15 mL 0   clopidogrel (PLAVIX) 75 MG tablet Take 1 tablet (75 mg total) by mouth daily with breakfast. 30 tablet 0   ferrous sulfate 325 (65 FE) MG tablet Take 1 tablet (325 mg total) by mouth daily. 30 tablet 2   fluticasone (FLOVENT HFA) 110 MCG/ACT inhaler Inhale 2 puffs into the lungs daily. 12 g 6   Multiple Vitamins-Minerals (MULTIVITAMIN WITH MINERALS) tablet Take 1 tablet by mouth daily.     pantoprazole (PROTONIX) 40 MG tablet Take 1 tablet (40 mg total) by mouth daily. 90 tablet 2   amLODipine (NORVASC) 10 MG tablet Take 1 tablet (10 mg total) by mouth daily. 30 tablet 0   ezetimibe (ZETIA) 10 MG tablet Take 1 tablet (10 mg total) by mouth daily. 90 tablet 3   gabapentin (NEURONTIN) 300 MG capsule Take 1 capsule (300 mg total) by mouth 3 (three) times daily. 270 capsule 1   losartan (COZAAR) 100 MG tablet Take 1 tablet (100 mg total) by mouth daily. 30 tablet 0   No facility-administered medications prior to visit.    No Known Allergies     Objective:    BP 134/67 (BP Location: Left Arm, Patient Position: Sitting, Cuff Size: Normal)   Pulse 72   Resp 19   Ht 5\' 8"  (1.727 m)   Wt 132 lb  (59.9 kg)   SpO2 98%   BMI 20.07 kg/m  Wt Readings from Last 3 Encounters:  02/17/24 132 lb (59.9 kg)  01/25/24 133 lb 12.8 oz (60.7 kg)  11/12/23 123 lb (55.8 kg)    Physical Exam Vitals and nursing note reviewed.  Constitutional:      Appearance: He is well-developed.  HENT:     Head: Normocephalic and atraumatic.  Cardiovascular:     Rate and Rhythm: Normal rate and regular rhythm.     Heart sounds: Normal heart sounds. No murmur heard.    No friction rub. No gallop.  Pulmonary:     Effort: Pulmonary effort is normal. No tachypnea or  respiratory distress.     Breath sounds: Normal breath sounds. No decreased breath sounds, wheezing, rhonchi or rales.  Chest:     Chest wall: No tenderness.  Abdominal:     General: Bowel sounds are normal.     Palpations: Abdomen is soft.  Musculoskeletal:        General: Normal range of motion.     Cervical back: Normal range of motion.  Skin:    General: Skin is warm and dry.  Neurological:     Mental Status: He is alert and oriented to person, place, and time.     Coordination: Coordination normal.  Psychiatric:        Behavior: Behavior normal. Behavior is cooperative.        Thought Content: Thought content normal.        Judgment: Judgment normal.          Patient has been counseled extensively about nutrition and exercise as well as the importance of adherence with medications and regular follow-up. The patient was given clear instructions to go to ER or return to medical center if symptoms don't improve, worsen or new problems develop. The patient verbalized understanding.   Follow-up: Return in about 3 months (around 05/19/2024).   Claiborne Rigg, FNP-BC [facility] [place], [place] [phone]   02/17/2024, 12:28 PM

## 2024-02-18 LAB — CBC WITH DIFFERENTIAL/PLATELET
Basophils Absolute: 0 10*3/uL (ref 0.0–0.2)
Basos: 0 %
EOS (ABSOLUTE): 0 10*3/uL (ref 0.0–0.4)
Eos: 1 %
Hematocrit: 39.2 % (ref 37.5–51.0)
Hemoglobin: 12.9 g/dL — ABNORMAL LOW (ref 13.0–17.7)
Immature Grans (Abs): 0 10*3/uL (ref 0.0–0.1)
Immature Granulocytes: 0 %
Lymphocytes Absolute: 2.2 10*3/uL (ref 0.7–3.1)
Lymphs: 31 %
MCH: 29.7 pg (ref 26.6–33.0)
MCHC: 32.9 g/dL (ref 31.5–35.7)
MCV: 90 fL (ref 79–97)
Monocytes Absolute: 1 10*3/uL — ABNORMAL HIGH (ref 0.1–0.9)
Monocytes: 14 %
Neutrophils Absolute: 3.8 10*3/uL (ref 1.4–7.0)
Neutrophils: 54 %
Platelets: 289 10*3/uL (ref 150–450)
RBC: 4.35 x10E6/uL (ref 4.14–5.80)
RDW: 12.4 % (ref 11.6–15.4)
WBC: 7.1 10*3/uL (ref 3.4–10.8)

## 2024-02-18 LAB — CMP14+EGFR
ALT: 60 IU/L — ABNORMAL HIGH (ref 0–44)
AST: 53 IU/L — ABNORMAL HIGH (ref 0–40)
Albumin: 4.5 g/dL (ref 3.9–4.9)
Alkaline Phosphatase: 110 IU/L (ref 44–121)
BUN/Creatinine Ratio: 10 (ref 10–24)
BUN: 11 mg/dL (ref 8–27)
Bilirubin Total: 0.3 mg/dL (ref 0.0–1.2)
CO2: 21 mmol/L (ref 20–29)
Calcium: 9.2 mg/dL (ref 8.6–10.2)
Chloride: 99 mmol/L (ref 96–106)
Creatinine, Ser: 1.1 mg/dL (ref 0.76–1.27)
Globulin, Total: 2.8 g/dL (ref 1.5–4.5)
Glucose: 98 mg/dL (ref 70–99)
Potassium: 4 mmol/L (ref 3.5–5.2)
Sodium: 136 mmol/L (ref 134–144)
Total Protein: 7.3 g/dL (ref 6.0–8.5)
eGFR: 76 mL/min/{1.73_m2} (ref >59)

## 2024-02-18 LAB — LIPID PANEL
Chol/HDL Ratio: 2.7 ratio (ref 0.0–5.0)
Cholesterol, Total: 140 mg/dL (ref 100–199)
HDL: 51 mg/dL (ref >39)
LDL Chol Calc (NIH): 63 mg/dL (ref 0–99)
Triglycerides: 151 mg/dL — ABNORMAL HIGH (ref 0–149)
VLDL Cholesterol Cal: 26 mg/dL (ref 5–40)

## 2024-02-19 ENCOUNTER — Ambulatory Visit (HOSPITAL_COMMUNITY)
Admission: RE | Admit: 2024-02-19 | Discharge: 2024-02-19 | Disposition: A | Discharge status: Home / Self Care | Payer: Medicaid Other | Source: Ambulatory Visit | Attending: Cardiovascular Disease | Admitting: Cardiovascular Disease

## 2024-02-19 DIAGNOSIS — R0989 Other specified symptoms and signs involving the circulatory and respiratory systems: Secondary | ICD-10-CM | POA: Diagnosis present

## 2024-02-26 ENCOUNTER — Ambulatory Visit
Admission: RE | Admit: 2024-02-26 | Discharge: 2024-02-26 | Disposition: A | Discharge status: Home / Self Care | Source: Ambulatory Visit | Attending: Nurse Practitioner | Admitting: Nurse Practitioner

## 2024-02-26 DIAGNOSIS — F17200 Nicotine dependence, unspecified, uncomplicated: Secondary | ICD-10-CM

## 2024-03-02 ENCOUNTER — Other Ambulatory Visit: Payer: Self-pay

## 2024-03-03 ENCOUNTER — Other Ambulatory Visit: Payer: Self-pay

## 2024-03-09 ENCOUNTER — Other Ambulatory Visit: Payer: Self-pay | Admitting: Nurse Practitioner

## 2024-03-09 ENCOUNTER — Other Ambulatory Visit: Payer: Self-pay

## 2024-03-09 DIAGNOSIS — I739 Peripheral vascular disease, unspecified: Secondary | ICD-10-CM

## 2024-03-09 MED ORDER — CLOPIDOGREL BISULFATE 75 MG PO TABS
75.0000 mg | ORAL_TABLET | Freq: Every day | ORAL | 1 refills | Status: DC
  Filled 2024-03-09 – 2024-03-18 (×2): qty 90, 90d supply, fill #0
  Filled 2024-06-28: qty 90, 90d supply, fill #1

## 2024-03-10 ENCOUNTER — Other Ambulatory Visit: Payer: Self-pay

## 2024-03-18 ENCOUNTER — Other Ambulatory Visit: Payer: Self-pay

## 2024-03-21 ENCOUNTER — Other Ambulatory Visit: Payer: Self-pay | Admitting: Hematology and Oncology

## 2024-03-21 ENCOUNTER — Inpatient Hospital Stay: Payer: Medicaid Other | Attending: Internal Medicine

## 2024-03-21 DIAGNOSIS — D509 Iron deficiency anemia, unspecified: Secondary | ICD-10-CM | POA: Diagnosis present

## 2024-03-21 DIAGNOSIS — D500 Iron deficiency anemia secondary to blood loss (chronic): Secondary | ICD-10-CM

## 2024-03-21 DIAGNOSIS — Z79899 Other long term (current) drug therapy: Secondary | ICD-10-CM | POA: Diagnosis not present

## 2024-03-21 LAB — CMP (CANCER CENTER ONLY)
ALT: 27 U/L (ref 0–44)
AST: 30 U/L (ref 15–41)
Albumin: 4.5 g/dL (ref 3.5–5.0)
Alkaline Phosphatase: 79 U/L (ref 38–126)
Anion gap: 7 (ref 5–15)
BUN: 10 mg/dL (ref 8–23)
CO2: 28 mmol/L (ref 22–32)
Calcium: 9.4 mg/dL (ref 8.9–10.3)
Chloride: 105 mmol/L (ref 98–111)
Creatinine: 1.03 mg/dL (ref 0.61–1.24)
GFR, Estimated: >60 mL/min (ref >60)
Glucose, Bld: 136 mg/dL — ABNORMAL HIGH (ref 70–99)
Potassium: 4.1 mmol/L (ref 3.5–5.1)
Sodium: 140 mmol/L (ref 135–145)
Total Bilirubin: 0.3 mg/dL (ref 0.0–1.2)
Total Protein: 7.5 g/dL (ref 6.5–8.1)

## 2024-03-21 LAB — CBC WITH DIFFERENTIAL (CANCER CENTER ONLY)
Abs Immature Granulocytes: 0.01 10*3/uL (ref 0.00–0.07)
Basophils Absolute: 0 10*3/uL (ref 0.0–0.1)
Basophils Relative: 1 %
Eosinophils Absolute: 0.1 10*3/uL (ref 0.0–0.5)
Eosinophils Relative: 1 %
HCT: 38.2 % — ABNORMAL LOW (ref 39.0–52.0)
Hemoglobin: 12.5 g/dL — ABNORMAL LOW (ref 13.0–17.0)
Immature Granulocytes: 0 %
Lymphocytes Relative: 30 %
Lymphs Abs: 1.7 10*3/uL (ref 0.7–4.0)
MCH: 29.3 pg (ref 26.0–34.0)
MCHC: 32.7 g/dL (ref 30.0–36.0)
MCV: 89.5 fL (ref 80.0–100.0)
Monocytes Absolute: 0.9 10*3/uL (ref 0.1–1.0)
Monocytes Relative: 15 %
Neutro Abs: 3 10*3/uL (ref 1.7–7.7)
Neutrophils Relative %: 53 %
Platelet Count: 279 10*3/uL (ref 150–400)
RBC: 4.27 MIL/uL (ref 4.22–5.81)
RDW: 14.2 % (ref 11.5–15.5)
WBC Count: 5.7 10*3/uL (ref 4.0–10.5)
nRBC: 0 % (ref 0.0–0.2)

## 2024-03-21 LAB — RETIC PANEL
Immature Retic Fract: 8.8 % (ref 2.3–15.9)
RBC.: 4.23 MIL/uL (ref 4.22–5.81)
Retic Count, Absolute: 75.7 10*3/uL (ref 19.0–186.0)
Retic Ct Pct: 1.8 % (ref 0.4–3.1)
Reticulocyte Hemoglobin: 34.4 pg (ref >27.9)

## 2024-03-21 LAB — IRON AND IRON BINDING CAPACITY (CC-WL,HP ONLY)
Iron: 176 ug/dL (ref 45–182)
Saturation Ratios: 55 % — ABNORMAL HIGH (ref 17.9–39.5)
TIBC: 321 ug/dL (ref 250–450)
UIBC: 145 ug/dL (ref 117–376)

## 2024-03-21 LAB — FERRITIN: Ferritin: 84 ng/mL (ref 24–336)

## 2024-03-22 ENCOUNTER — Other Ambulatory Visit: Payer: Self-pay

## 2024-03-27 NOTE — Progress Notes (Unsigned)
 [facility] [phone]   [phone]  PROGRESS NOTE  Patient Care Team: Collins Dean, NP as PCP - General (Nurse Practitioner) Avanell Leigh, MD as PCP - Cardiology (Cardiology)  Hematological/Oncological History # Normocytic Anemia  # Iron Deficiency Anemia 10/20/2022: WBC 5.5, Hgb 7.2, MCV 67, Plt 349 01/20/2023: WBC 6.1, Hgb 12.4, MCV 88, Plt 274 06/18/2023: WBC 6.2, Hgb 10.6, MCV 84, Plt 358 06/29/2023: establish care with Dr. Rosaline Coma   Interval History:  Ruben Fuentes 63 y.o. male with medical history significant for normocytic anemia (most likely iron deficiency anemia) who presents for a follow up visit. The patient's last visit was on 10/212024. In the interim since the last visit he has continued on his p.o. iron therapy.  On exam today Ruben Fuentes is accompanied by his wife.  He reports he has been well overall in the interim since her last visit.  He is not having any trouble with allergies in the pollen.  He reports he is had no major changes in his health.  He denies any flu or norovirus infections.  He reports that he is taking his iron pills without any difficulty.  He is not having any stomach upset or constipation.  He does endorse having some dark stools but not tarry or sticky.  He reports he is not having any overt signs of bleeding, bruising, or blood in stool.  He notes that his energy levels are good and currently ranks it as a 10 out of 10.  He reports his strength is excellent.  He has had a good appetite and is eating plenty of iron rich foods including red meat and dark green leafy vegetables, however he has lost 9 pounds since February.  The reason for this is unclear and does not intentional.  Is undergone his routine cancer screening including colonoscopy in December 2024 as well as CT lung for lung cancer screening in March 2025, but the read on the CT scan is still pending.  Otherwise denies any fevers, chills, sweats,  nausea, vomiting or diarrhea.  Full 10 point ROS is otherwise negative.  MEDICAL HISTORY:  Past Medical History:  Diagnosis Date   Arthritis    Bursitis    Hypertension    PAD (peripheral artery disease) (HCC)     SURGICAL HISTORY: Past Surgical History:  Procedure Laterality Date   ABDOMINAL AORTOGRAM W/LOWER EXTREMITY N/A 04/28/2022   Procedure: ABDOMINAL AORTOGRAM W/LOWER EXTREMITY;  Surgeon: Avanell Leigh, MD;  Location: [facility];  Service: Cardiovascular;  Laterality: N/A;   FRACTURE SURGERY     collar bones   FRACTURE SURGERY     right ankle   PERIPHERAL VASCULAR INTERVENTION  04/28/2022   Procedure: PERIPHERAL VASCULAR INTERVENTION;  Surgeon: Avanell Leigh, MD;  Location: [facility];  Service: Cardiovascular;;    SOCIAL HISTORY: Social History   Socioeconomic History   Marital status: Married    Spouse name: Ruben Fuentes   Number of children: 2   Years of education: Not on file   Highest education level: Not on file  Occupational History   Not on file  Tobacco Use   Smoking status: Some Days    Current packs/day: 0.30    Average packs/day: 0.3 packs/day for 30.0 years (9.0 ttl pk-yrs)    Types: Cigarettes   Smokeless tobacco: Never  Vaping Use   Vaping status: Never Used  Substance and Sexual Activity   Alcohol use: Yes  Alcohol/week: 2.0 standard drinks of alcohol    Types: 2 Cans of beer per week    Comment: up to 5 beers a day   Drug use: Yes    Types: Marijuana    Comment: 2 cigs daily   Sexual activity: Not Currently  Other Topics Concern   Not on file  Social History Narrative   Not on file   Social Drivers of Health   Financial Resource Strain: Medium Risk (06/02/2022)   Overall Financial Resource Strain (CARDIA)    Difficulty of Paying Living Expenses: Somewhat hard  Food Insecurity: Not on file  Transportation Needs: No Transportation Needs (06/02/2022)   PRAPARE - Transportation    Lack of Transportation  (Medical): No    Lack of Transportation (Non-Medical): No  Physical Activity: Not on file  Stress: Not on file  Social Connections: Not on file  Intimate Partner Violence: Not on file    FAMILY HISTORY: Family History  Problem Relation Age of Onset   Hypertension Mother    Hypertension Father    Rheum arthritis Sister    Diabetes Brother    Colon cancer Neg Hx    Esophageal cancer Neg Hx     ALLERGIES:  has no known allergies.  MEDICATIONS:  Current Outpatient Medications  Medication Sig Dispense Refill   albuterol  (VENTOLIN  HFA) 108 (90 Base) MCG/ACT inhaler Inhale 2 puffs into the lungs every 6 (six) hours as needed for wheezing or shortness of breath. 18 g 2   amLODipine  (NORVASC ) 10 MG tablet Take 1 tablet (10 mg total) by mouth daily. 90 tablet 1   aspirin  EC (ASPIRIN  ADULT LOW STRENGTH) 81 MG tablet Take 1 tablet (81 mg total) by mouth daily. 100 tablet 0   atorvastatin  (LIPITOR) 40 MG tablet Take 1 tablet (40 mg total) by mouth daily. 90 tablet 2   b complex vitamins capsule Take 1 capsule by mouth daily. 90 capsule 0   carbamide peroxide (DEBROX) 6.5 % OTIC solution Place 5 drops into the right ear 2 (two) times daily. 15 mL 0   clopidogrel  (PLAVIX ) 75 MG tablet Take 1 tablet (75 mg total) by mouth daily with breakfast. 90 tablet 1   ezetimibe  (ZETIA ) 10 MG tablet Take 1 tablet (10 mg total) by mouth daily. 90 tablet 3   ferrous sulfate  325 (65 FE) MG tablet Take 1 tablet (325 mg total) by mouth daily. 30 tablet 2   fluticasone  (FLOVENT  HFA) 110 MCG/ACT inhaler Inhale 2 puffs into the lungs daily. 12 g 6   gabapentin  (NEURONTIN ) 300 MG capsule Take 1 capsule (300 mg total) by mouth 3 (three) times daily. 270 capsule 1   losartan  (COZAAR ) 100 MG tablet Take 1 tablet (100 mg total) by mouth daily. 90 tablet 1   Multiple Vitamins-Minerals (MULTIVITAMIN WITH MINERALS) tablet Take 1 tablet by mouth daily.     pantoprazole  (PROTONIX ) 40 MG tablet Take 1 tablet (40 mg total) by  mouth daily. 90 tablet 2   No current facility-administered medications for this visit.    REVIEW OF SYSTEMS:   Constitutional: ( - ) fevers, ( - )  chills , ( - ) night sweats Eyes: ( - ) blurriness of vision, ( - ) double vision, ( - ) watery eyes Ears, nose, mouth, throat, and face: ( - ) mucositis, ( - ) sore throat Respiratory: ( - ) cough, ( - ) dyspnea, ( - ) wheezes Cardiovascular: ( - ) palpitation, ( - ) chest discomfort, ( - )  lower extremity swelling Gastrointestinal:  ( - ) nausea, ( - ) heartburn, ( - ) change in bowel habits Skin: ( - ) abnormal skin rashes Lymphatics: ( - ) new lymphadenopathy, ( - ) easy bruising Neurological: ( - ) numbness, ( - ) tingling, ( - ) new weaknesses Behavioral/Psych: ( - ) mood change, ( - ) new changes  All other systems were reviewed with the patient and are negative.  PHYSICAL EXAMINATION:  Vitals:   03/28/24 1025  BP: 131/61  Pulse: 61  Resp: 13  Temp: (!) 97.2 F (36.2 C)  SpO2: 100%    Filed Weights   03/28/24 1025  Weight: 124 lb 3.2 oz (56.3 kg)     GENERAL: Well-appearing middle-aged African-American male alert, no distress and comfortable SKIN: skin color, texture, turgor are normal, no rashes or significant lesions EYES: conjunctiva are pink and non-injected, sclera clear LUNGS: clear to auscultation and percussion with normal breathing effort HEART: regular rate & rhythm and no murmurs and no lower extremity edema Musculoskeletal: no cyanosis of digits and no clubbing  PSYCH: alert & oriented x 3, fluent speech NEURO: no focal motor/sensory deficits  LABORATORY DATA:  I have reviewed the data as listed    Latest Ref Rng & Units 03/21/2024    8:32 AM 02/17/2024    9:38 AM 09/21/2023    9:59 AM  CBC  WBC 4.0 - 10.5 K/uL 5.7  7.1  6.0   Hemoglobin 13.0 - 17.0 g/dL 16.1  09.6  04.5   Hematocrit 39.0 - 52.0 % 38.2  39.2  36.0   Platelets 150 - 400 K/uL 279  289  238        Latest Ref Rng & Units 03/21/2024     8:32 AM 02/17/2024    9:38 AM 09/21/2023    9:59 AM  CMP  Glucose 70 - 99 mg/dL 409  98  91   BUN 8 - 23 mg/dL 10  11  10    Creatinine 0.61 - 1.24 mg/dL 8.11  9.14  7.82   Sodium 135 - 145 mmol/L 140  136  135   Potassium 3.5 - 5.1 mmol/L 4.1  4.0  4.2   Chloride 98 - 111 mmol/L 105  99  102   CO2 22 - 32 mmol/L 28  21  26    Calcium  8.9 - 10.3 mg/dL 9.4  9.2  9.3   Total Protein 6.5 - 8.1 g/dL 7.5  7.3  7.2   Total Bilirubin 0.0 - 1.2 mg/dL 0.3  0.3  0.4   Alkaline Phos 38 - 126 U/L 79  110  69   AST 15 - 41 U/L 30  53  33   ALT 0 - 44 U/L 27  60  35     Lab Results  Component Value Date   MPROTEIN Not Observed 06/29/2023   Lab Results  Component Value Date   KPAFRELGTCHN 21.7 (H) 06/29/2023   LAMBDASER 21.1 06/29/2023   KAPLAMBRATIO 1.03 06/29/2023    RADIOGRAPHIC STUDIES: No results found.  ASSESSMENT & PLAN Ruben Fuentes 63 y.o. male with medical history significant for normocytic anemia (most likely iron deficiency anemia) who presents for a follow up visit.   After review of the labs, review of the records, and discussion with the patient the patients findings are most consistent with normocytic anemia which appears to be responding to iron therapy   # Normocytic Anemia of Unclear Etiology --Continue ferrous sulfate  325 mg p.o.  daily, was started by his wife over-the-counter. -- The patient's blood counts do appear to be responding to p.o. iron therapy.  Would recommend continuation at this time. -- No other clear etiology for the patient's anemia --Labs today show white blood cell count 5.7, hemoglobin 12.5, MCV 89.5, platelets 279.  Ferritin 84 with an iron sat of 55%. -- if no clear etiology can be found and the blood counts are worsening will need to consider bone marrow biopsy -- RTC in 12 months time with labs 1 week before.  Offered to return care to PCP but patient would like to continue following our clinic.  No orders of the defined types were placed  in this encounter.   All questions were answered. The patient knows to call the clinic with any problems, questions or concerns.  A total of more than 30 minutes were spent on this encounter with face-to-face time and non-face-to-face time, including preparing to see the patient, ordering tests and/or medications, counseling the patient and coordination of care as outlined above.   Ruben Clay, MD Department of Hematology/Oncology [facility] Phone: [phone] Pager: [phone] Email: [email or web address]  03/28/2024 11:07 AM

## 2024-03-28 ENCOUNTER — Inpatient Hospital Stay (HOSPITAL_BASED_OUTPATIENT_CLINIC_OR_DEPARTMENT_OTHER): Payer: Medicaid Other | Admitting: Hematology and Oncology

## 2024-03-28 ENCOUNTER — Other Ambulatory Visit: Payer: Self-pay

## 2024-03-28 VITALS — BP 131/61 | HR 61 | Temp 97.2°F | Resp 13 | Wt 124.2 lb

## 2024-03-28 DIAGNOSIS — D500 Iron deficiency anemia secondary to blood loss (chronic): Secondary | ICD-10-CM | POA: Diagnosis not present

## 2024-03-28 DIAGNOSIS — D509 Iron deficiency anemia, unspecified: Secondary | ICD-10-CM | POA: Diagnosis not present

## 2024-03-28 DIAGNOSIS — D649 Anemia, unspecified: Secondary | ICD-10-CM | POA: Diagnosis not present

## 2024-03-31 ENCOUNTER — Other Ambulatory Visit: Payer: Self-pay

## 2024-03-31 ENCOUNTER — Other Ambulatory Visit: Payer: Self-pay | Admitting: Nurse Practitioner

## 2024-03-31 DIAGNOSIS — K219 Gastro-esophageal reflux disease without esophagitis: Secondary | ICD-10-CM

## 2024-03-31 MED ORDER — PANTOPRAZOLE SODIUM 40 MG PO TBEC
40.0000 mg | DELAYED_RELEASE_TABLET | Freq: Every day | ORAL | 1 refills | Status: DC
  Filled 2024-03-31: qty 90, 90d supply, fill #0
  Filled 2024-06-28: qty 90, 90d supply, fill #1

## 2024-04-06 ENCOUNTER — Other Ambulatory Visit: Payer: Self-pay

## 2024-04-25 ENCOUNTER — Other Ambulatory Visit: Payer: Self-pay

## 2024-04-28 ENCOUNTER — Other Ambulatory Visit: Payer: Self-pay

## 2024-04-29 ENCOUNTER — Other Ambulatory Visit: Payer: Self-pay

## 2024-05-18 ENCOUNTER — Other Ambulatory Visit: Payer: Self-pay

## 2024-05-18 ENCOUNTER — Encounter: Payer: Self-pay | Admitting: Nurse Practitioner

## 2024-05-18 ENCOUNTER — Ambulatory Visit: Attending: Nurse Practitioner | Admitting: Nurse Practitioner

## 2024-05-18 VITALS — BP 133/76 | HR 72 | Resp 19 | Ht 68.0 in | Wt 124.8 lb

## 2024-05-18 DIAGNOSIS — I10 Essential (primary) hypertension: Secondary | ICD-10-CM

## 2024-05-18 DIAGNOSIS — J449 Chronic obstructive pulmonary disease, unspecified: Secondary | ICD-10-CM

## 2024-05-18 DIAGNOSIS — R350 Frequency of micturition: Secondary | ICD-10-CM | POA: Diagnosis not present

## 2024-05-18 DIAGNOSIS — F17200 Nicotine dependence, unspecified, uncomplicated: Secondary | ICD-10-CM

## 2024-05-18 MED ORDER — ALBUTEROL SULFATE HFA 108 (90 BASE) MCG/ACT IN AERS
2.0000 | INHALATION_SPRAY | Freq: Four times a day (QID) | RESPIRATORY_TRACT | 2 refills | Status: AC | PRN
  Filled 2024-05-18 – 2024-06-09 (×2): qty 18, 25d supply, fill #0

## 2024-05-18 NOTE — Progress Notes (Signed)
 Assessment & Plan:   Ruben Fuentes was seen today for hypertension.  Diagnoses and all orders for this visit:  Primary hypertension  Tobacco dependence -     albuterol  (VENTOLIN  HFA) 108 (90 Base) MCG/ACT inhaler; Inhale 2 puffs into the lungs every 6 (six) hours as needed for wheezing or shortness of breath.  COPD GOLD II -     albuterol  (VENTOLIN  HFA) 108 (90 Base) MCG/ACT inhaler; Inhale 2 puffs into the lungs every 6 (six) hours as needed for wheezing or shortness of breath.     Patient has been counseled on age-appropriate routine health concerns for screening and prevention. These are reviewed and up-to-date. Referrals have been placed accordingly. Immunizations are up-to-date or declined.    Subjective:   Chief Complaint  Patient presents with   Hypertension    Ruben Fuentes 63 y.o. male presents to office today for follow up to HTN  He is accompanied by his wife Lorelee Roger today.  He is recent lung CT results were discussed today in office and he verbalized understanding.  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  He is taking all his antihypertensive medications as prescribed including amlodipine  10 mg daily and losartan  100 mg daily. BP Readings from Last 3 Encounters:  05/18/24 133/76  03/28/24 131/61  02/17/24 134/67    His oncologist is requesting a repeat PSA today Lab Results  Component Value Date   PSA1 3.4 10/20/2022   PSA1 1.9 01/11/2018     Hypertension Pertinent negatives include no blurred vision, chest pain, headaches, malaise/fatigue, palpitations or shortness of breath.    Review of Systems  Constitutional:  Negative for fever, malaise/fatigue and weight loss.  HENT: Negative.  Negative for nosebleeds.   Eyes: Negative.  Negative for blurred  vision, double vision and photophobia.  Respiratory: Negative.  Negative for cough and shortness of breath.   Cardiovascular: Negative.  Negative for chest pain, palpitations and leg swelling.  Gastrointestinal: Negative.  Negative for heartburn, nausea and vomiting.  Musculoskeletal: Negative.  Negative for myalgias.  Neurological: Negative.  Negative for dizziness, focal weakness, seizures and headaches.  Psychiatric/Behavioral: Negative.  Negative for suicidal ideas.     Past Medical History:  Diagnosis Date   Arthritis    Bursitis    Hypertension    PAD (peripheral artery disease) (HCC)     Past Surgical History:  Procedure Laterality Date   ABDOMINAL AORTOGRAM W/LOWER EXTREMITY N/A 04/28/2022   Procedure: ABDOMINAL AORTOGRAM W/LOWER EXTREMITY;  Surgeon: Avanell Leigh, MD;  Location: [facility];  Service: Cardiovascular;  Laterality: N/A;   FRACTURE SURGERY     collar bones   FRACTURE SURGERY     right ankle   PERIPHERAL VASCULAR INTERVENTION  04/28/2022   Procedure: PERIPHERAL VASCULAR INTERVENTION;  Surgeon: Avanell Leigh, MD;  Location: [facility];  Service: Cardiovascular;;    Family History  Problem Relation Age of Onset   Hypertension Mother    Hypertension Father    Rheum arthritis Sister    Diabetes Brother    Colon cancer Neg Hx    Esophageal cancer Neg Hx     Social History Reviewed with no changes to be made today.   Outpatient Medications Prior to Visit  Medication Sig Dispense Refill   amLODipine  (NORVASC )  10 MG tablet Take 1 tablet (10 mg total) by mouth daily. 90 tablet 1   atorvastatin  (LIPITOR) 40 MG tablet Take 1 tablet (40 mg total) by mouth daily. 90 tablet 2   b complex vitamins capsule Take 1 capsule by mouth daily. 90 capsule 0   carbamide peroxide (DEBROX) 6.5 % OTIC solution Place 5 drops into the right ear 2 (two) times daily. 15 mL 0   clopidogrel  (PLAVIX ) 75 MG tablet Take 1 tablet (75 mg total) by mouth daily with  breakfast. 90 tablet 1   ezetimibe  (ZETIA ) 10 MG tablet Take 1 tablet (10 mg total) by mouth daily. 90 tablet 3   ferrous sulfate  325 (65 FE) MG tablet Take 1 tablet (325 mg total) by mouth daily. 30 tablet 2   fluticasone  (FLOVENT  HFA) 110 MCG/ACT inhaler Inhale 2 puffs into the lungs daily. 12 g 6   gabapentin  (NEURONTIN ) 300 MG capsule Take 1 capsule (300 mg total) by mouth 3 (three) times daily. 270 capsule 1   losartan  (COZAAR ) 100 MG tablet Take 1 tablet (100 mg total) by mouth daily. 90 tablet 1   Multiple Vitamins-Minerals (MULTIVITAMIN WITH MINERALS) tablet Take 1 tablet by mouth daily.     pantoprazole  (PROTONIX ) 40 MG tablet Take 1 tablet (40 mg total) by mouth daily. 90 tablet 1   albuterol  (VENTOLIN  HFA) 108 (90 Base) MCG/ACT inhaler Inhale 2 puffs into the lungs every 6 (six) hours as needed for wheezing or shortness of breath. 18 g 2   aspirin  EC (ASPIRIN  ADULT LOW STRENGTH) 81 MG tablet Take 1 tablet (81 mg total) by mouth daily. 100 tablet 0   No facility-administered medications prior to visit.    No Known Allergies     Objective:    BP 133/76 (BP Location: Left Arm, Patient Position: Sitting, Cuff Size: Normal)   Pulse 72   Resp 19   Ht 5' 8 (1.727 m)   Wt 124 lb 12.8 oz (56.6 kg)   SpO2 100%   BMI 18.98 kg/m  Wt Readings from Last 3 Encounters:  05/18/24 124 lb 12.8 oz (56.6 kg)  03/28/24 124 lb 3.2 oz (56.3 kg)  02/17/24 132 lb (59.9 kg)    Physical Exam Vitals and nursing note reviewed.  Constitutional:      Appearance: He is well-developed.  HENT:     Head: Normocephalic and atraumatic.  Cardiovascular:     Rate and Rhythm: Normal rate and regular rhythm.     Heart sounds: Normal heart sounds. No murmur heard.    No friction rub. No gallop.  Pulmonary:     Effort: Pulmonary effort is normal. No tachypnea or respiratory distress.     Breath sounds: Normal breath sounds. No decreased breath sounds, wheezing, rhonchi or rales.  Chest:     Chest  wall: No tenderness.  Abdominal:     General: Bowel sounds are normal.     Palpations: Abdomen is soft.  Musculoskeletal:        General: Normal range of motion.     Cervical back: Normal range of motion.  Skin:    General: Skin is warm and dry.  Neurological:     Mental Status: He is alert and oriented to person, place, and time.     Coordination: Coordination normal.  Psychiatric:        Behavior: Behavior normal. Behavior is cooperative.        Thought Content: Thought content normal.  Judgment: Judgment normal.          Patient has been counseled extensively about nutrition and exercise as well as the importance of adherence with medications and regular follow-up. The patient was given clear instructions to go to ER or return to medical center if symptoms don't improve, worsen or new problems develop. The patient verbalized understanding.   Follow-up: No follow-ups on file.   Collins Dean, FNP-BC [facility] [place], [place] [phone]   05/18/2024, 9:49 AM

## 2024-05-19 LAB — PSA: Prostate Specific Ag, Serum: 3.3 ng/mL (ref 0.0–4.0)

## 2024-05-23 ENCOUNTER — Ambulatory Visit: Payer: Self-pay | Admitting: Nurse Practitioner

## 2024-05-25 ENCOUNTER — Other Ambulatory Visit (HOSPITAL_COMMUNITY): Payer: Self-pay

## 2024-05-27 ENCOUNTER — Other Ambulatory Visit: Payer: Self-pay

## 2024-06-09 ENCOUNTER — Other Ambulatory Visit: Payer: Self-pay

## 2024-06-28 ENCOUNTER — Other Ambulatory Visit: Payer: Self-pay

## 2024-08-04 ENCOUNTER — Other Ambulatory Visit: Payer: Self-pay

## 2024-08-05 ENCOUNTER — Other Ambulatory Visit: Payer: Self-pay

## 2024-08-17 ENCOUNTER — Telehealth: Payer: Self-pay | Admitting: Nurse Practitioner

## 2024-08-17 NOTE — Telephone Encounter (Signed)
 Pt confirmed appt 9/10 (per vr)

## 2024-08-19 ENCOUNTER — Encounter: Payer: Self-pay | Admitting: Nurse Practitioner

## 2024-08-19 ENCOUNTER — Other Ambulatory Visit: Payer: Self-pay

## 2024-08-19 ENCOUNTER — Ambulatory Visit: Attending: Nurse Practitioner | Admitting: Nurse Practitioner

## 2024-08-19 VITALS — BP 124/76 | HR 67 | Resp 20 | Ht 68.0 in | Wt 121.6 lb

## 2024-08-19 DIAGNOSIS — E782 Mixed hyperlipidemia: Secondary | ICD-10-CM

## 2024-08-19 DIAGNOSIS — Z23 Encounter for immunization: Secondary | ICD-10-CM

## 2024-08-19 DIAGNOSIS — I10 Essential (primary) hypertension: Secondary | ICD-10-CM

## 2024-08-19 DIAGNOSIS — M792 Neuralgia and neuritis, unspecified: Secondary | ICD-10-CM | POA: Diagnosis not present

## 2024-08-19 DIAGNOSIS — K219 Gastro-esophageal reflux disease without esophagitis: Secondary | ICD-10-CM

## 2024-08-19 DIAGNOSIS — I739 Peripheral vascular disease, unspecified: Secondary | ICD-10-CM | POA: Diagnosis not present

## 2024-08-19 DIAGNOSIS — D649 Anemia, unspecified: Secondary | ICD-10-CM

## 2024-08-19 DIAGNOSIS — Z79899 Other long term (current) drug therapy: Secondary | ICD-10-CM

## 2024-08-19 DIAGNOSIS — Z7902 Long term (current) use of antithrombotics/antiplatelets: Secondary | ICD-10-CM

## 2024-08-19 MED ORDER — PANTOPRAZOLE SODIUM 40 MG PO TBEC
40.0000 mg | DELAYED_RELEASE_TABLET | Freq: Every day | ORAL | 1 refills | Status: DC
  Filled 2024-08-19 – 2024-09-26 (×2): qty 90, 90d supply, fill #0
  Filled 2024-12-12: qty 90, 90d supply, fill #1

## 2024-08-19 MED ORDER — AMLODIPINE BESYLATE 10 MG PO TABS
10.0000 mg | ORAL_TABLET | Freq: Every day | ORAL | 1 refills | Status: DC
  Filled 2024-08-19: qty 90, 90d supply, fill #0
  Filled 2024-12-12: qty 90, 90d supply, fill #1

## 2024-08-19 MED ORDER — GABAPENTIN 300 MG PO CAPS
300.0000 mg | ORAL_CAPSULE | Freq: Three times a day (TID) | ORAL | 1 refills | Status: AC
  Filled 2024-08-19 – 2024-10-26 (×3): qty 270, 90d supply, fill #0

## 2024-08-19 MED ORDER — COVID-19 MRNA VAC-TRIS(PFIZER) 30 MCG/0.3ML IM SUSY
0.3000 mL | PREFILLED_SYRINGE | Freq: Once | INTRAMUSCULAR | 0 refills | Status: AC
  Filled 2024-09-02: qty 0.3, 1d supply, fill #0

## 2024-08-19 MED ORDER — LOSARTAN POTASSIUM 100 MG PO TABS
100.0000 mg | ORAL_TABLET | Freq: Every day | ORAL | 1 refills | Status: DC
  Filled 2024-08-19: qty 90, 90d supply, fill #0
  Filled 2024-12-12: qty 90, 90d supply, fill #1

## 2024-08-19 MED ORDER — ATORVASTATIN CALCIUM 40 MG PO TABS
40.0000 mg | ORAL_TABLET | Freq: Every day | ORAL | 2 refills | Status: AC
  Filled 2024-08-19 – 2024-10-19 (×2): qty 90, 90d supply, fill #0

## 2024-08-19 MED ORDER — CLOPIDOGREL BISULFATE 75 MG PO TABS
75.0000 mg | ORAL_TABLET | Freq: Every day | ORAL | 1 refills | Status: AC
  Filled 2024-08-19 – 2024-09-26 (×2): qty 90, 90d supply, fill #0
  Filled 2024-12-12: qty 90, 90d supply, fill #1

## 2024-08-19 NOTE — Progress Notes (Signed)
 Assessment & Plan:  Ruben Fuentes was seen today for hypertension.  Diagnoses and all orders for this visit:  Primary hypertension -     amLODipine  (NORVASC ) 10 MG tablet; Take 1 tablet (10 mg total) by mouth daily. -     losartan  (COZAAR ) 100 MG tablet; Take 1 tablet (100 mg total) by mouth daily. Blood pressure well-controlled. - Continue current antihypertensive regimen. - Refill medications for six months.   PAD (peripheral artery disease) (HCC) -     clopidogrel  (PLAVIX ) 75 MG tablet; Take 1 tablet (75 mg total) by mouth daily with breakfast.  Neuropathic pain -     gabapentin  (NEURONTIN ) 300 MG capsule; Take 1 capsule (300 mg total) by mouth 3 (three) times daily.  Gastroesophageal reflux disease without esophagitis -     pantoprazole  (PROTONIX ) 40 MG tablet; Take 1 tablet (40 mg total) by mouth daily. INSTRUCTIONS: Avoid GERD Triggers: acidic, spicy or fried foods, caffeine, coffee, sodas,  alcohol and chocolate.    Need for influenza vaccination -     Flu vaccine trivalent PF, 6mos and older(Flulaval,Afluria,Fluarix,Fluzone)  Need for pneumococcal 20-valent conjugate vaccination -     Pneumococcal conjugate vaccine 20-valent  Need for COVID-19 vaccine -     COVID-19 mRNA vaccine, Pfizer, (COMIRNATY) syringe; Inject 0.3 mLs into the muscle once for 1 dose.  Mixed hyperlipidemia -     atorvastatin  (LIPITOR) 40 MG tablet; Take 1 tablet (40 mg total) by mouth daily.  INSTRUCTIONS: Work on a low fat, heart healthy diet and participate in regular aerobic exercise program by working out at least 150 minutes per week; 5 days a week-30 minutes per day. Avoid red meat/beef/steak,  fried foods. junk foods, sodas, sugary drinks, unhealthy snacking, alcohol and smoking.  Drink at least 80 oz of water per day and monitor your carbohydrate intake daily.     Patient has been counseled on age-appropriate routine health concerns for screening and prevention. These are reviewed and up-to-date.  Referrals have been placed accordingly. Immunizations are up-to-date or declined.    Subjective:   Chief Complaint  Patient presents with   Hypertension   History of Present Illness Ruben Fuentes is a 63 year old male who presents accompanied by his wife for f/u to HTN and for a flu vaccine and health maintenance visit.  HTN Blood pressure is well-controlled with amlodipine  10 mg daily and losartan  100 mg BP Readings from Last 3 Encounters:  08/19/24 124/76  05/18/24 133/76  03/28/24 131/61     He experiences aches and pains, particularly with changes in temperature, but no burning or tingling sensations. His pain is controlled with gabapentin , which he finds effective.    Review of Systems  Constitutional:  Negative for fever, malaise/fatigue and weight loss.  HENT: Negative.  Negative for nosebleeds.   Eyes: Negative.  Negative for blurred vision, double vision and photophobia.  Respiratory: Negative.  Negative for cough and shortness of breath.   Cardiovascular: Negative.  Negative for chest pain, palpitations and leg swelling.  Gastrointestinal: Negative.  Negative for heartburn, nausea and vomiting.  Musculoskeletal:  Positive for joint pain. Negative for myalgias.  Neurological: Negative.  Negative for dizziness, focal weakness, seizures and headaches.  Psychiatric/Behavioral: Negative.  Negative for suicidal ideas.     Past Medical History:  Diagnosis Date   Arthritis    Bursitis    Hypertension    PAD (peripheral artery disease) (HCC)     Past Surgical History:  Procedure Laterality Date  ABDOMINAL AORTOGRAM W/LOWER EXTREMITY N/A 04/28/2022   Procedure: ABDOMINAL AORTOGRAM W/LOWER EXTREMITY;  Surgeon: Court Dorn PARAS, MD;  Location[facility] INVASIVE CV LAB;  Service: Cardiovascular;  Laterality: N/A;   FRACTURE SURGERY     collar bones   FRACTURE SURGERY     right ankle   PERIPHERAL VASCULAR INTERVENTION  04/28/2022   Procedure: PERIPHERAL VASCULAR  INTERVENTION;  Surgeon: Court Dorn PARAS, MD;  Location: [facility] INVASIVE CV LAB;  Service: Cardiovascular;;    Family History  Problem Relation Age of Onset   Hypertension Mother    Hypertension Father    Rheum arthritis Sister    Diabetes Brother    Colon cancer Neg Hx    Esophageal cancer Neg Hx     Social History Reviewed with no changes to be made today.   Outpatient Medications Prior to Visit  Medication Sig Dispense Refill   albuterol  (VENTOLIN  HFA) 108 (90 Base) MCG/ACT inhaler Inhale 2 puffs into the lungs every 6 (six) hours as needed for wheezing or shortness of breath. 18 g 2   b complex vitamins capsule Take 1 capsule by mouth daily. 90 capsule 0   carbamide peroxide (DEBROX) 6.5 % OTIC solution Place 5 drops into the right ear 2 (two) times daily. 15 mL 0   ezetimibe  (ZETIA ) 10 MG tablet Take 1 tablet (10 mg total) by mouth daily. 90 tablet 3   ferrous sulfate  325 (65 FE) MG tablet Take 1 tablet (325 mg total) by mouth daily. 30 tablet 2   fluticasone  (FLOVENT  HFA) 110 MCG/ACT inhaler Inhale 2 puffs into the lungs daily. 12 g 6   Multiple Vitamins-Minerals (MULTIVITAMIN WITH MINERALS) tablet Take 1 tablet by mouth daily.     amLODipine  (NORVASC ) 10 MG tablet Take 1 tablet (10 mg total) by mouth daily. 90 tablet 1   atorvastatin  (LIPITOR) 40 MG tablet Take 1 tablet (40 mg total) by mouth daily. 90 tablet 2   clopidogrel  (PLAVIX ) 75 MG tablet Take 1 tablet (75 mg total) by mouth daily with breakfast. 90 tablet 1   gabapentin  (NEURONTIN ) 300 MG capsule Take 1 capsule (300 mg total) by mouth 3 (three) times daily. 270 capsule 1   losartan  (COZAAR ) 100 MG tablet Take 1 tablet (100 mg total) by mouth daily. 90 tablet 1   pantoprazole  (PROTONIX ) 40 MG tablet Take 1 tablet (40 mg total) by mouth daily. 90 tablet 1   No facility-administered medications prior to visit.    No Known Allergies     Objective:    BP 124/76 (BP Location: Left Arm, Patient Position: Sitting, Cuff  Size: Normal)   Pulse 67   Resp 20   Ht 5' 8 (1.727 m)   Wt 121 lb 9.6 oz (55.2 kg)   SpO2 100%   BMI 18.49 kg/m  Wt Readings from Last 3 Encounters:  08/19/24 121 lb 9.6 oz (55.2 kg)  05/18/24 124 lb 12.8 oz (56.6 kg)  03/28/24 124 lb 3.2 oz (56.3 kg)    Physical Exam Vitals and nursing note reviewed.  Constitutional:      Appearance: He is well-developed.  HENT:     Head: Normocephalic and atraumatic.  Cardiovascular:     Rate and Rhythm: Normal rate and regular rhythm.     Heart sounds: Normal heart sounds. No murmur heard.    No friction rub. No gallop.  Pulmonary:     Effort: Pulmonary effort is normal. No tachypnea or respiratory distress.     Breath sounds: Normal  breath sounds. No decreased breath sounds, wheezing, rhonchi or rales.  Chest:     Chest wall: No tenderness.  Abdominal:     General: Bowel sounds are normal.     Palpations: Abdomen is soft.  Musculoskeletal:        General: Normal range of motion.     Cervical back: Normal range of motion.  Skin:    General: Skin is warm and dry.  Neurological:     Mental Status: He is alert and oriented to person, place, and time.     Coordination: Coordination normal.  Psychiatric:        Behavior: Behavior normal. Behavior is cooperative.        Thought Content: Thought content normal.        Judgment: Judgment normal.          Patient has been counseled extensively about nutrition and exercise as well as the importance of adherence with medications and regular follow-up. The patient was given clear instructions to go to ER or return to medical center if symptoms don't improve, worsen or new problems develop. The patient verbalized understanding.   Follow-up: Return in about 4 months (around 12/19/2024).   Ruben LELON Servant, FNP-BC [facility] [place], [place] [phone]   08/19/2024, 12:22 PM

## 2024-09-02 ENCOUNTER — Other Ambulatory Visit: Payer: Self-pay

## 2024-09-26 ENCOUNTER — Other Ambulatory Visit: Payer: Self-pay

## 2024-10-19 ENCOUNTER — Other Ambulatory Visit: Payer: Self-pay

## 2024-10-20 ENCOUNTER — Other Ambulatory Visit: Payer: Self-pay

## 2024-10-21 ENCOUNTER — Other Ambulatory Visit: Payer: Self-pay

## 2024-10-26 ENCOUNTER — Other Ambulatory Visit: Payer: Self-pay

## 2024-11-07 ENCOUNTER — Other Ambulatory Visit: Payer: Self-pay | Admitting: Nurse Practitioner

## 2024-11-15 ENCOUNTER — Ambulatory Visit: Attending: Cardiovascular Disease | Admitting: Cardiovascular Disease

## 2024-11-15 ENCOUNTER — Encounter: Payer: Self-pay | Admitting: Cardiovascular Disease

## 2024-11-15 VITALS — BP 118/50 | HR 59 | Ht 68.0 in | Wt 120.0 lb

## 2024-11-15 DIAGNOSIS — E782 Mixed hyperlipidemia: Secondary | ICD-10-CM

## 2024-11-15 DIAGNOSIS — I739 Peripheral vascular disease, unspecified: Secondary | ICD-10-CM

## 2024-11-15 DIAGNOSIS — I10 Essential (primary) hypertension: Secondary | ICD-10-CM

## 2024-11-15 DIAGNOSIS — Z720 Tobacco use: Secondary | ICD-10-CM

## 2024-11-15 NOTE — Assessment & Plan Note (Signed)
 History of PAD with right lower extremity claudication initially.  I performed peripheral angiography on him 04/28/2022 revealing a 90% right external iliac artery stenosis which I stented with a 7 mm x 29 mm long VBX covered stent.  He had a short segment CTO at the origin of his right SFA which I stented with a 6 mm x 60 mm long Eluvia drug-eluting stent.  He had three-vessel runoff.  His Dopplers normalized and his claudication resolved.  His last Doppler studies performed 11/10/2023 revealed normal ABIs bilaterally with patent stents.  He denies claudication.  We will repeat his lower extremity arterial Doppler studies.

## 2024-11-15 NOTE — Progress Notes (Signed)
 11/15/2024 Ruben Fuentes   [birth year]  [id number]  Primary Physician Theotis Haze ORN, NP Primary Cardiologist: Dorn JINNY Lesches MD [facility] [place], [place]  HPI:  Ruben Fuentes is a 63 y.o.   thin-appearing married African-American male father of 2 children with no grandchildren who is retired from doing sheet rock work.  He was referred by Dr. Primus for PAD.  He is accompanied by his wife and today.  I last saw him in the office 09/22/2023.  He does have a history of continued tobacco abuse, treated hypertension and hyperlipidemia.  There is no family history of heart disease.  Never had heart attack or stroke.  He denies chest pain or shortness of breath.  He has had claudication for the last several months primarily involving his right leg with recent Doppler studies performed 04/02/22 revealing a right ABI of 0.50 and a left of 0.97.  He does have a high-frequency signal in his right extrailiac artery and a short segment CTO proximal right SFA.  He wishes to proceed with angiography and potential endovascular therapy.  I performed peripheral angiography on him 04/28/2022 revealing a 90% proximal right extrailiac artery stenosis which I stented with a 7 mm x 20 mm long VBX covered stent.  He had a short segment CTO of the ostium/proximal part of his right SFA which I was able to cross and stent with a 6 mm x 40 mm long Eluvia drug-eluting stent.  His ABI normalized.  His claudication has resolved.   Since I saw him over a year ago he is down to 3 cigarettes a day.  He denies chest pain or shortness of breath or claudication.  His most recent Doppler studies performed 11/10/2023 revealed his stents to be widely patent.   Current Meds  Medication Sig   albuterol  (VENTOLIN  HFA) 108 (90 Base) MCG/ACT inhaler Inhale 2 puffs into the lungs every 6 (six) hours as needed for wheezing or shortness of breath.   amLODipine  (NORVASC ) 10 MG tablet Take 1 tablet (10 mg total) by mouth daily.    atorvastatin  (LIPITOR) 40 MG tablet Take 1 tablet (40 mg total) by mouth daily.   b complex vitamins capsule Take 1 capsule by mouth daily.   clopidogrel  (PLAVIX ) 75 MG tablet Take 1 tablet (75 mg total) by mouth daily with breakfast.   ezetimibe  (ZETIA ) 10 MG tablet Take 1 tablet (10 mg total) by mouth daily.   ferrous sulfate  325 (65 FE) MG tablet Take 1 tablet (325 mg total) by mouth daily.   fluticasone  (FLOVENT  HFA) 110 MCG/ACT inhaler Inhale 2 puffs into the lungs daily.   gabapentin  (NEURONTIN ) 300 MG capsule Take 1 capsule (300 mg total) by mouth 3 (three) times daily.   losartan  (COZAAR ) 100 MG tablet Take 1 tablet (100 mg total) by mouth daily.   Multiple Vitamins-Minerals (MULTIVITAMIN WITH MINERALS) tablet Take 1 tablet by mouth daily.   pantoprazole  (PROTONIX ) 40 MG tablet Take 1 tablet (40 mg total) by mouth daily.     No Known Allergies  Social History   Socioeconomic History   Marital status: Married    Spouse name: Marciano Mundt   Number of children: 2   Years of education: Not on file   Highest education level: Not on file  Occupational History   Not on file  Tobacco Use   Smoking status: Every Day    Current packs/day: 0.30    Average packs/day: 0.3 packs/day for 30.0 years (  9.0 ttl pk-yrs)    Types: Cigarettes   Smokeless tobacco: Never  Vaping Use   Vaping status: Never Used  Substance and Sexual Activity   Alcohol use: Yes    Alcohol/week: 2.0 standard drinks of alcohol    Types: 2 Cans of beer per week    Comment: up to 5 beers a day   Drug use: Yes    Types: Marijuana    Comment: 2 cigs daily   Sexual activity: Not Currently  Other Topics Concern   Not on file  Social History Narrative   Not on file   Social Drivers of Health   Financial Resource Strain: Medium Risk (06/02/2022)   Overall Financial Resource Strain (CARDIA)    Difficulty of Paying Living Expenses: Somewhat hard  Food Insecurity: No Food Insecurity (05/18/2024)   Hunger  Vital Sign    Worried About Running Out of Food in the Last Year: Never true    Ran Out of Food in the Last Year: Never true  Transportation Needs: No Transportation Needs (06/02/2022)   PRAPARE - Administrator, Civil Service (Medical): No    Lack of Transportation (Non-Medical): No  Physical Activity: Not on file  Stress: Not on file  Social Connections: Not on file  Intimate Partner Violence: Not At Risk (05/18/2024)   Humiliation, Afraid, Rape, and Kick questionnaire    Fear of Current or Ex-Partner: No    Emotionally Abused: No    Physically Abused: No    Sexually Abused: No     Review of Systems: General: negative for chills, fever, night sweats or weight changes.  Cardiovascular: negative for chest pain, dyspnea on exertion, edema, orthopnea, palpitations, paroxysmal nocturnal dyspnea or shortness of breath Dermatological: negative for rash Respiratory: negative for cough or wheezing Urologic: negative for hematuria Abdominal: negative for nausea, vomiting, diarrhea, bright red blood per rectum, melena, or hematemesis Neurologic: negative for visual changes, syncope, or dizziness All other systems reviewed and are otherwise negative except as noted above.    Blood pressure (!) 118/50, pulse (!) 59, height 5' 8 (1.727 m), weight 120 lb (54.4 kg), SpO2 99%.  General appearance: alert and no distress Neck: no adenopathy, no carotid bruit, no JVD, supple, symmetrical, trachea midline, and thyroid not enlarged, symmetric, no tenderness/mass/nodules Lungs: clear to auscultation bilaterally Heart: regular rate and rhythm, S1, S2 normal, no murmur, click, rub or gallop Extremities: extremities normal, atraumatic, no cyanosis or edema Pulses: 2+ and symmetric Skin: Skin color, texture, turgor normal. No rashes or lesions Neurologic: Grossly normal  EKG EKG Interpretation Date/Time:  Tuesday November 15 2024 10:53:05 EST Ventricular Rate:  59 PR Interval:  150 QRS  Duration:  86 QT Interval:  424 QTC Calculation: 419 R Axis:   79  Text Interpretation: Sinus bradycardia When compared with ECG of 22-Sep-2023 09:06, Questionable change in QRS axis T wave inversion no longer evident in Inferior leads Confirmed by Court Carrier [phone]) on 11/15/2024 10:59:20 AM    ASSESSMENT AND PLAN:   PAD (peripheral artery disease) History of PAD with right lower extremity claudication initially.  I performed peripheral angiography on him 04/28/2022 revealing a 90% right external iliac artery stenosis which I stented with a 7 mm x 29 mm long VBX covered stent.  He had a short segment CTO at the origin of his right SFA which I stented with a 6 mm x 60 mm long Eluvia drug-eluting stent.  He had three-vessel runoff.  His Dopplers normalized and his  claudication resolved.  His last Doppler studies performed 11/10/2023 revealed normal ABIs bilaterally with patent stents.  He denies claudication.  We will repeat his lower extremity arterial Doppler studies.     Dorn DOROTHA Lesches MD FACP,FACC,FAHA, [facility] 11/15/2024 11:05 AM

## 2024-11-15 NOTE — Patient Instructions (Signed)
 Medication Instructions:  Your physician recommends that you continue on your current medications as directed. Please refer to the Current Medication list given to you today.  *If you need a refill on your cardiac medications before your next appointment, please call your pharmacy*  Testing/Procedures: Your physician has requested that you have an Aorta/Iliac Duplex. This will take place at [address] 4th floor  No food after 11PM the night before.  Water is OK. (Don't drink liquids if you have been instructed not to for ANOTHER test) Avoid foods that produce bowel gas, for 24 hours prior to exam (see below). No breakfast, no chewing gum, no smoking or carbonated beverages. Patient may take morning medications with water. Come in for test at least 15 minutes early to register.  Please note: We ask at that you not bring children with you during ultrasound (echo/ vascular) testing. Due to room size and safety concerns, children are not allowed in the ultrasound rooms during exams. Our front office staff cannot provide observation of children in our lobby area while testing is being conducted. An adult accompanying a patient to their appointment will only be allowed in the ultrasound room at the discretion of the ultrasound technician under special circumstances. We apologize for any inconvenience.   Your physician has requested that you have a lower extremity arterial duplex. During this test, ultrasound is used to evaluate arterial blood flow in the legs. Allow one hour for this exam. There are no restrictions or special instructions. This will take place at [address], 4th floor  Please note: We ask at that you not bring children with you during ultrasound (echo/ vascular) testing. Due to room size and safety concerns, children are not allowed in the ultrasound rooms during exams. Our front office staff cannot provide observation of children in our lobby area while testing is being  conducted. An adult accompanying a patient to their appointment will only be allowed in the ultrasound room at the discretion of the ultrasound technician under special circumstances. We apologize for any inconvenience.   Your physician has requested that you have an ankle brachial index (ABI). During this test an ultrasound and blood pressure cuff are used to evaluate the arteries that supply the arms and legs with blood. Allow thirty minutes for this exam. There are no restrictions or special instructions. This will take place at [address], 4th floor   Please note: We ask at that you not bring children with you during ultrasound (echo/ vascular) testing. Due to room size and safety concerns, children are not allowed in the ultrasound rooms during exams. Our front office staff cannot provide observation of children in our lobby area while testing is being conducted. An adult accompanying a patient to their appointment will only be allowed in the ultrasound room at the discretion of the ultrasound technician under special circumstances. We apologize for any inconvenience.   Follow-Up: At [facility], you and your health needs are our priority.  As part of our continuing mission to provide you with exceptional heart care, our providers are all part of one team.  This team includes your primary Cardiologist (physician) and Advanced Practice Providers or APPs (Physician Assistants and Nurse Practitioners) who all work together to provide you with the care you need, when you need it.  Your next appointment:   12 month(s) (PV only)  Provider:   Dorn Lesches, MD    We recommend signing up for the patient portal called MyChart.  Sign up information is provided on this After Visit Summary.  MyChart is used to connect with patients for Virtual Visits (Telemedicine).  Patients are able to view lab/test results, encounter notes, upcoming appointments, etc.  Non-urgent messages can be sent  to your provider as well.   To learn more about what you can do with MyChart, go to [email or web address].   Other Instructions

## 2024-12-12 ENCOUNTER — Other Ambulatory Visit: Payer: Self-pay

## 2024-12-16 ENCOUNTER — Telehealth: Payer: Self-pay | Admitting: Nurse Practitioner

## 2024-12-16 NOTE — Telephone Encounter (Signed)
 Pt confirmed appt

## 2024-12-19 ENCOUNTER — Other Ambulatory Visit: Payer: Self-pay

## 2024-12-19 ENCOUNTER — Ambulatory Visit: Attending: Nurse Practitioner | Admitting: Nurse Practitioner

## 2024-12-19 ENCOUNTER — Encounter: Payer: Self-pay | Admitting: Nurse Practitioner

## 2024-12-19 VITALS — BP 104/63 | HR 69 | Ht 68.0 in | Wt 119.8 lb

## 2024-12-19 DIAGNOSIS — D649 Anemia, unspecified: Secondary | ICD-10-CM

## 2024-12-19 DIAGNOSIS — I10 Essential (primary) hypertension: Secondary | ICD-10-CM | POA: Diagnosis not present

## 2024-12-19 DIAGNOSIS — K219 Gastro-esophageal reflux disease without esophagitis: Secondary | ICD-10-CM | POA: Diagnosis not present

## 2024-12-19 MED ORDER — AMLODIPINE BESYLATE 10 MG PO TABS
10.0000 mg | ORAL_TABLET | Freq: Every day | ORAL | 1 refills | Status: AC
  Filled 2024-12-19: qty 90, 90d supply, fill #0

## 2024-12-19 MED ORDER — LOSARTAN POTASSIUM 100 MG PO TABS
100.0000 mg | ORAL_TABLET | Freq: Every day | ORAL | 1 refills | Status: AC
  Filled 2024-12-19: qty 90, 90d supply, fill #0

## 2024-12-19 MED ORDER — PANTOPRAZOLE SODIUM 40 MG PO TBEC
40.0000 mg | DELAYED_RELEASE_TABLET | Freq: Every day | ORAL | 1 refills | Status: AC
  Filled 2024-12-19: qty 90, 90d supply, fill #0

## 2024-12-19 NOTE — Progress Notes (Signed)
 "  Assessment & Plan:  Ruben Fuentes was seen today for hypertension.  Diagnoses and all orders for this visit:  Primary hypertension -     amLODipine  (NORVASC ) 10 MG tablet; Take 1 tablet (10 mg total) by mouth daily. -     losartan  (COZAAR ) 100 MG tablet; Take 1 tablet (100 mg total) by mouth daily. -     CMP14+EGFR Continue all antihypertensives as prescribed.  Reminded to bring in blood pressure log for follow  up appointment.  RECOMMENDATIONS: DASH/Mediterranean Diets are healthier choices for HTN.    Gastroesophageal reflux disease without esophagitis Symptoms well controlled -     pantoprazole  (PROTONIX ) 40 MG tablet; Take 1 tablet (40 mg total) by mouth daily. INSTRUCTIONS: Avoid GERD Triggers: acidic, spicy or fried foods, caffeine, coffee, sodas,  alcohol and chocolate.    Low hemoglobin -     CBC with Differential/Platelet    Patient has been counseled on age-appropriate routine health concerns for screening and prevention. These are reviewed and up-to-date. Referrals have been placed accordingly. Immunizations are up-to-date or declined.    Subjective:   Chief Complaint  Patient presents with   Hypertension    Ruben Fuentes 64 y.o. male presents to office today for follow up to HTN.   CT Chest due in March.  HTN Blood pressure is well controlled. He is currently taking amlodipine  and losartan  daily as prescribed. Not experiencing any palpitations, shortness of breath or chest pains.  BP Readings from Last 3 Encounters:  12/19/24 104/63  11/15/24 (!) 118/50  08/19/24 124/76     Review of Systems  Constitutional:  Negative for fever, malaise/fatigue and weight loss.  HENT: Negative.  Negative for nosebleeds.   Eyes: Negative.  Negative for blurred vision, double vision and photophobia.  Respiratory: Negative.  Negative for cough and shortness of breath.   Cardiovascular: Negative.  Negative for chest pain, palpitations and leg swelling.  Gastrointestinal:  Negative.  Negative for heartburn, nausea and vomiting.  Musculoskeletal: Negative.  Negative for myalgias.  Neurological: Negative.  Negative for dizziness, focal weakness, seizures and headaches.  Psychiatric/Behavioral: Negative.  Negative for suicidal ideas.     Past Medical History:  Diagnosis Date   Arthritis    Bursitis    Hypertension    PAD (peripheral artery disease)     Past Surgical History:  Procedure Laterality Date   ABDOMINAL AORTOGRAM W/LOWER EXTREMITY N/A 04/28/2022   Procedure: ABDOMINAL AORTOGRAM W/LOWER EXTREMITY;  Surgeon: Court Dorn PARAS, MD;  Location: [facility];  Service: Cardiovascular;  Laterality: N/A;   FRACTURE SURGERY     collar bones   FRACTURE SURGERY     right ankle   PERIPHERAL VASCULAR INTERVENTION  04/28/2022   Procedure: PERIPHERAL VASCULAR INTERVENTION;  Surgeon: Court Dorn PARAS, MD;  Location: [facility];  Service: Cardiovascular;;    Family History  Problem Relation Age of Onset   Hypertension Mother    Hypertension Father    Rheum arthritis Sister    Diabetes Brother    Colon cancer Neg Hx    Esophageal cancer Neg Hx     Social History Reviewed with no changes to be made today.   Outpatient Medications Prior to Visit  Medication Sig Dispense Refill   albuterol  (VENTOLIN  HFA) 108 (90 Base) MCG/ACT inhaler Inhale 2 puffs into the lungs every 6 (six) hours as needed for wheezing or shortness of breath. 18 g 2   atorvastatin  (LIPITOR) 40 MG tablet Take 1 tablet (40  mg total) by mouth daily. 90 tablet 2   b complex vitamins capsule Take 1 capsule by mouth daily. 90 capsule 0   carbamide peroxide (DEBROX) 6.5 % OTIC solution Place 5 drops into the right ear 2 (two) times daily. 15 mL 0   clopidogrel  (PLAVIX ) 75 MG tablet Take 1 tablet (75 mg total) by mouth daily with breakfast. 90 tablet 1   ezetimibe  (ZETIA ) 10 MG tablet Take 1 tablet (10 mg total) by mouth daily. 90 tablet 3   ferrous sulfate  325 (65 FE) MG tablet  Take 1 tablet (325 mg total) by mouth daily. 30 tablet 2   fluticasone  (FLOVENT  HFA) 110 MCG/ACT inhaler Inhale 2 puffs into the lungs daily. 12 g 6   gabapentin  (NEURONTIN ) 300 MG capsule Take 1 capsule (300 mg total) by mouth 3 (three) times daily. 270 capsule 1   Multiple Vitamins-Minerals (MULTIVITAMIN WITH MINERALS) tablet Take 1 tablet by mouth daily.     amLODipine  (NORVASC ) 10 MG tablet Take 1 tablet (10 mg total) by mouth daily. 90 tablet 1   losartan  (COZAAR ) 100 MG tablet Take 1 tablet (100 mg total) by mouth daily. 90 tablet 1   pantoprazole  (PROTONIX ) 40 MG tablet Take 1 tablet (40 mg total) by mouth daily. 90 tablet 1   No facility-administered medications prior to visit.    Allergies[1]     Objective:    BP 104/63 (BP Location: Left Arm, Patient Position: Sitting, Cuff Size: Normal)   Pulse 69   Ht 5' 8 (1.727 m)   Wt 119 lb 12.8 oz (54.3 kg)   SpO2 98%   BMI 18.22 kg/m  Wt Readings from Last 3 Encounters:  12/19/24 119 lb 12.8 oz (54.3 kg)  11/15/24 120 lb (54.4 kg)  08/19/24 121 lb 9.6 oz (55.2 kg)    Physical Exam Vitals and nursing note reviewed.  Constitutional:      Appearance: He is well-developed.  HENT:     Head: Normocephalic and atraumatic.  Cardiovascular:     Rate and Rhythm: Normal rate and regular rhythm.     Heart sounds: Normal heart sounds. No murmur heard.    No friction rub. No gallop.  Pulmonary:     Effort: Pulmonary effort is normal. No tachypnea or respiratory distress.     Breath sounds: Normal breath sounds. No decreased breath sounds, wheezing, rhonchi or rales.  Chest:     Chest wall: No tenderness.  Musculoskeletal:        General: Normal range of motion.     Cervical back: Normal range of motion.  Skin:    General: Skin is warm and dry.  Neurological:     Mental Status: He is alert and oriented to person, place, and time.     Coordination: Coordination normal.  Psychiatric:        Behavior: Behavior normal. Behavior is  cooperative.        Thought Content: Thought content normal.        Judgment: Judgment normal.          Patient has been counseled extensively about nutrition and exercise as well as the importance of adherence with medications and regular follow-up. The patient was given clear instructions to go to ER or return to medical center if symptoms don't improve, worsen or new problems develop. The patient verbalized understanding.   Follow-up: Return in about 3 months (around 03/19/2025).   Timarion Agcaoili W Imogine Carvell, FNP-BC [facility] [place], [place]  [phone]   12/19/2024, 9:13 AM    [1] No Known Allergies  "

## 2024-12-20 ENCOUNTER — Ambulatory Visit: Payer: Self-pay | Admitting: Nurse Practitioner

## 2024-12-20 LAB — CBC WITH DIFFERENTIAL/PLATELET
Basophils Absolute: 0 x10E3/uL (ref 0.0–0.2)
Basos: 1 %
EOS (ABSOLUTE): 0 x10E3/uL (ref 0.0–0.4)
Eos: 1 %
Hematocrit: 38.6 % (ref 37.5–51.0)
Hemoglobin: 12.9 g/dL — ABNORMAL LOW (ref 13.0–17.7)
Immature Grans (Abs): 0 x10E3/uL (ref 0.0–0.1)
Immature Granulocytes: 0 %
Lymphocytes Absolute: 1.7 x10E3/uL (ref 0.7–3.1)
Lymphs: 31 %
MCH: 29.7 pg (ref 26.6–33.0)
MCHC: 33.4 g/dL (ref 31.5–35.7)
MCV: 89 fL (ref 79–97)
Monocytes Absolute: 0.9 x10E3/uL (ref 0.1–0.9)
Monocytes: 16 %
Neutrophils Absolute: 2.8 x10E3/uL (ref 1.4–7.0)
Neutrophils: 51 %
Platelets: 317 x10E3/uL (ref 150–450)
RBC: 4.35 x10E6/uL (ref 4.14–5.80)
RDW: 12.4 % (ref 11.6–15.4)
WBC: 5.4 x10E3/uL (ref 3.4–10.8)

## 2024-12-20 LAB — CMP14+EGFR
ALT: 23 IU/L (ref 0–44)
AST: 25 IU/L (ref 0–40)
Albumin: 4.4 g/dL (ref 3.9–4.9)
Alkaline Phosphatase: 104 IU/L (ref 47–123)
BUN/Creatinine Ratio: 9 — ABNORMAL LOW (ref 10–24)
BUN: 10 mg/dL (ref 8–27)
Bilirubin Total: 0.3 mg/dL (ref 0.0–1.2)
CO2: 22 mmol/L (ref 20–29)
Calcium: 9.2 mg/dL (ref 8.6–10.2)
Chloride: 98 mmol/L (ref 96–106)
Creatinine, Ser: 1.13 mg/dL (ref 0.76–1.27)
Globulin, Total: 2.5 g/dL (ref 1.5–4.5)
Glucose: 114 mg/dL — ABNORMAL HIGH (ref 70–99)
Potassium: 4.2 mmol/L (ref 3.5–5.2)
Sodium: 135 mmol/L (ref 134–144)
Total Protein: 6.9 g/dL (ref 6.0–8.5)
eGFR: 73 mL/min/1.73

## 2024-12-23 ENCOUNTER — Ambulatory Visit (HOSPITAL_COMMUNITY)
Admission: RE | Admit: 2024-12-23 | Discharge: 2024-12-23 | Disposition: A | Discharge status: Home / Self Care | Source: Ambulatory Visit | Attending: Cardiovascular Disease | Admitting: Cardiovascular Disease

## 2024-12-23 ENCOUNTER — Ambulatory Visit: Payer: Self-pay | Admitting: Cardiovascular Disease

## 2024-12-23 DIAGNOSIS — I739 Peripheral vascular disease, unspecified: Secondary | ICD-10-CM | POA: Diagnosis present

## 2024-12-23 DIAGNOSIS — Z95828 Presence of other vascular implants and grafts: Secondary | ICD-10-CM | POA: Insufficient documentation

## 2024-12-23 DIAGNOSIS — Z720 Tobacco use: Secondary | ICD-10-CM | POA: Diagnosis present

## 2024-12-23 DIAGNOSIS — I10 Essential (primary) hypertension: Secondary | ICD-10-CM | POA: Insufficient documentation

## 2024-12-23 LAB — VAS US ABI WITH/WO TBI
Left ABI: 0.99
Right ABI: 1

## 2025-01-27 ENCOUNTER — Ambulatory Visit: Admitting: Internal Medicine

## 2025-04-03 ENCOUNTER — Other Ambulatory Visit

## 2025-04-10 ENCOUNTER — Ambulatory Visit: Admitting: Hematology and Oncology
